# Patient Record
Sex: Female | Born: 1995 | Race: White | Hispanic: No | Marital: Single | Smoking: Former smoker
Health system: Southern US, Community
[De-identification: ages and names within clinical notes are randomized; demographics above are authoritative.]

## PROBLEM LIST (undated history)

## (undated) ENCOUNTER — Inpatient Hospital Stay: Payer: Self-pay

## (undated) DIAGNOSIS — F32A Depression, unspecified: Secondary | ICD-10-CM

## (undated) DIAGNOSIS — F419 Anxiety disorder, unspecified: Secondary | ICD-10-CM

## (undated) DIAGNOSIS — Z8619 Personal history of other infectious and parasitic diseases: Secondary | ICD-10-CM

## (undated) DIAGNOSIS — Z8669 Personal history of other diseases of the nervous system and sense organs: Secondary | ICD-10-CM

## (undated) DIAGNOSIS — A6000 Herpesviral infection of urogenital system, unspecified: Secondary | ICD-10-CM

## (undated) DIAGNOSIS — M519 Unspecified thoracic, thoracolumbar and lumbosacral intervertebral disc disorder: Secondary | ICD-10-CM

## (undated) DIAGNOSIS — F329 Major depressive disorder, single episode, unspecified: Secondary | ICD-10-CM

## (undated) DIAGNOSIS — I10 Essential (primary) hypertension: Secondary | ICD-10-CM

## (undated) DIAGNOSIS — N390 Urinary tract infection, site not specified: Secondary | ICD-10-CM

## (undated) DIAGNOSIS — O139 Gestational [pregnancy-induced] hypertension without significant proteinuria, unspecified trimester: Secondary | ICD-10-CM

## (undated) HISTORY — DX: Personal history of other infectious and parasitic diseases: Z86.19

## (undated) HISTORY — DX: Depression, unspecified: F32.A

## (undated) HISTORY — DX: Personal history of other diseases of the nervous system and sense organs: Z86.69

## (undated) HISTORY — DX: Urinary tract infection, site not specified: N39.0

## (undated) HISTORY — DX: Anxiety disorder, unspecified: F41.9

## (undated) HISTORY — DX: Major depressive disorder, single episode, unspecified: F32.9

## (undated) HISTORY — PX: OTHER SURGICAL HISTORY: SHX169

---
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
## 2014-05-16 ENCOUNTER — Emergency Department: Payer: Self-pay | Admitting: Emergency Medicine

## 2014-05-16 LAB — COMPREHENSIVE METABOLIC PANEL
ALBUMIN: 4.3 g/dL (ref 3.8–5.6)
ALK PHOS: 102 U/L
AST: 12 U/L (ref 0–26)
Anion Gap: 10 (ref 7–16)
BILIRUBIN TOTAL: 0.5 mg/dL (ref 0.2–1.0)
BUN: 16 mg/dL (ref 7–18)
CHLORIDE: 109 mmol/L — AB (ref 98–107)
Calcium, Total: 9.3 mg/dL (ref 9.0–10.7)
Co2: 21 mmol/L (ref 21–32)
Creatinine: 0.89 mg/dL (ref 0.60–1.30)
EGFR (African American): >60
EGFR (Non-African Amer.): >60
Glucose: 95 mg/dL (ref 65–99)
OSMOLALITY: 280 (ref 275–301)
POTASSIUM: 4 mmol/L (ref 3.5–5.1)
SGPT (ALT): 21 U/L
SODIUM: 140 mmol/L (ref 136–145)
TOTAL PROTEIN: 8 g/dL (ref 6.4–8.6)

## 2014-05-16 LAB — DRUG SCREEN, URINE
AMPHETAMINES, UR SCREEN: NEGATIVE (ref ?–1000)
BENZODIAZEPINE, UR SCRN: NEGATIVE (ref ?–200)
Barbiturates, Ur Screen: NEGATIVE (ref ?–200)
Cannabinoid 50 Ng, Ur ~~LOC~~: NEGATIVE (ref ?–50)
Cocaine Metabolite,Ur ~~LOC~~: POSITIVE (ref ?–300)
MDMA (Ecstasy)Ur Screen: NEGATIVE (ref ?–500)
Methadone, Ur Screen: NEGATIVE (ref ?–300)
Opiate, Ur Screen: NEGATIVE (ref ?–300)
Phencyclidine (PCP) Ur S: NEGATIVE (ref ?–25)
Tricyclic, Ur Screen: NEGATIVE (ref ?–1000)

## 2014-05-16 LAB — URINALYSIS, COMPLETE
Bilirubin,UR: NEGATIVE
Glucose,UR: NEGATIVE mg/dL (ref 0–75)
Nitrite: NEGATIVE
Ph: 5 (ref 4.5–8.0)
Protein: 100
Specific Gravity: 1.029 (ref 1.003–1.030)
Squamous Epithelial: 7

## 2014-05-16 LAB — CBC
HCT: 45.7 % (ref 35.0–47.0)
HGB: 15.1 g/dL (ref 12.0–16.0)
MCH: 30.3 pg (ref 26.0–34.0)
MCHC: 33.1 g/dL (ref 32.0–36.0)
MCV: 92 fL (ref 80–100)
Platelet: 269 10*3/uL (ref 150–440)
RBC: 4.98 10*6/uL (ref 3.80–5.20)
RDW: 13.3 % (ref 11.5–14.5)
WBC: 10.3 10*3/uL (ref 3.6–11.0)

## 2014-05-17 ENCOUNTER — Emergency Department: Payer: Self-pay | Admitting: Emergency Medicine

## 2014-05-17 LAB — CBC WITH DIFFERENTIAL/PLATELET
BASOS PCT: 0.1 %
Basophil #: 0 10*3/uL (ref 0.0–0.1)
EOS PCT: 0 %
Eosinophil #: 0 10*3/uL (ref 0.0–0.7)
HCT: 47.2 % — ABNORMAL HIGH (ref 35.0–47.0)
HGB: 15.2 g/dL (ref 12.0–16.0)
LYMPHS ABS: 1.1 10*3/uL (ref 1.0–3.6)
Lymphocyte %: 7.1 %
MCH: 29.9 pg (ref 26.0–34.0)
MCHC: 32.2 g/dL (ref 32.0–36.0)
MCV: 93 fL (ref 80–100)
MONOS PCT: 2.4 %
Monocyte #: 0.4 x10 3/mm (ref 0.2–0.9)
Neutrophil #: 13.4 10*3/uL — ABNORMAL HIGH (ref 1.4–6.5)
Neutrophil %: 90.4 %
Platelet: 301 10*3/uL (ref 150–440)
RBC: 5.08 10*6/uL (ref 3.80–5.20)
RDW: 13.4 % (ref 11.5–14.5)
WBC: 14.9 10*3/uL — ABNORMAL HIGH (ref 3.6–11.0)

## 2014-05-17 LAB — BASIC METABOLIC PANEL
ANION GAP: 11 (ref 7–16)
BUN: 18 mg/dL (ref 7–18)
CO2: 22 mmol/L (ref 21–32)
CREATININE: 1.04 mg/dL (ref 0.60–1.30)
Calcium, Total: 9.4 mg/dL (ref 9.0–10.7)
Chloride: 106 mmol/L (ref 98–107)
EGFR (Non-African Amer.): >60
Glucose: 106 mg/dL — ABNORMAL HIGH (ref 65–99)
Osmolality: 280 (ref 275–301)
Potassium: 3.8 mmol/L (ref 3.5–5.1)
Sodium: 139 mmol/L (ref 136–145)

## 2015-02-23 ENCOUNTER — Encounter: Payer: Self-pay | Admitting: Emergency Medicine

## 2015-02-23 ENCOUNTER — Emergency Department
Admission: EM | Admit: 2015-02-23 | Discharge: 2015-02-23 | Disposition: A | Discharge status: Home / Self Care | Payer: Self-pay | Attending: Emergency Medicine | Admitting: Emergency Medicine

## 2015-02-23 DIAGNOSIS — H6991 Unspecified Eustachian tube disorder, right ear: Secondary | ICD-10-CM | POA: Insufficient documentation

## 2015-02-23 DIAGNOSIS — Z3202 Encounter for pregnancy test, result negative: Secondary | ICD-10-CM | POA: Insufficient documentation

## 2015-02-23 DIAGNOSIS — H6981 Other specified disorders of Eustachian tube, right ear: Secondary | ICD-10-CM

## 2015-02-23 DIAGNOSIS — B9689 Other specified bacterial agents as the cause of diseases classified elsewhere: Secondary | ICD-10-CM

## 2015-02-23 DIAGNOSIS — N760 Acute vaginitis: Secondary | ICD-10-CM | POA: Insufficient documentation

## 2015-02-23 DIAGNOSIS — N72 Inflammatory disease of cervix uteri: Secondary | ICD-10-CM | POA: Insufficient documentation

## 2015-02-23 LAB — URINALYSIS COMPLETE WITH MICROSCOPIC (ARMC ONLY)
BILIRUBIN URINE: NEGATIVE
Bacteria, UA: NONE SEEN
Glucose, UA: NEGATIVE mg/dL
KETONES UR: NEGATIVE mg/dL
Nitrite: NEGATIVE
PH: 5 (ref 5.0–8.0)
Protein, ur: 30 mg/dL — AB
Specific Gravity, Urine: 1.031 — ABNORMAL HIGH (ref 1.005–1.030)

## 2015-02-23 LAB — WET PREP, GENITAL
Clue Cells Wet Prep HPF POC: POSITIVE — AB
Trich, Wet Prep: NONE SEEN
Yeast Wet Prep HPF POC: NONE SEEN

## 2015-02-23 LAB — CHLAMYDIA/NGC RT PCR (ARMC ONLY)
CHLAMYDIA TR: NOT DETECTED
N GONORRHOEAE: NOT DETECTED

## 2015-02-23 LAB — POCT PREGNANCY, URINE: PREG TEST UR: NEGATIVE

## 2015-02-23 MED ORDER — IBUPROFEN 800 MG PO TABS: 800.0000 mg | ORAL_TABLET | Freq: Three times a day (TID) | ORAL | Status: DC | PRN

## 2015-02-23 MED ORDER — CEFTRIAXONE SODIUM 1 G IJ SOLR
500.0000 mg | Freq: Once | INTRAMUSCULAR | Status: AC
  Administered 2015-02-23: 500 mg via INTRAMUSCULAR
  Filled 2015-02-23: qty 10

## 2015-02-23 MED ORDER — METRONIDAZOLE 500 MG PO TABS: 500.0000 mg | ORAL_TABLET | Freq: Two times a day (BID) | ORAL | Status: DC

## 2015-02-23 MED ORDER — LIDOCAINE HCL (PF) 1 % IJ SOLN
INTRAMUSCULAR | Status: AC
  Filled 2015-02-23: qty 5

## 2015-02-23 MED ORDER — AZITHROMYCIN 250 MG PO TABS
1000.0000 mg | ORAL_TABLET | Freq: Once | ORAL | Status: AC
  Administered 2015-02-23: 1000 mg via ORAL
  Filled 2015-02-23: qty 4

## 2015-02-23 MED ORDER — CETIRIZINE HCL 10 MG PO CAPS: 10.0000 mg | ORAL_CAPSULE | Freq: Every day | ORAL | Status: DC

## 2015-02-23 NOTE — ED Notes (Signed)
Pt reports bilateral ear pain for 2-3 weeks; Pt also here for STD check, reports vaginal discharge with foul odor for 5-6 days.

## 2015-02-23 NOTE — Discharge Instructions (Signed)
Bacterial Vaginosis °Bacterial vaginosis is a vaginal infection that occurs when the normal balance of bacteria in the vagina is disrupted. It results from an overgrowth of certain bacteria. This is the most common vaginal infection in women of childbearing age. Treatment is important to prevent complications, especially in pregnant women, as it can cause a premature delivery. °CAUSES  °Bacterial vaginosis is caused by an increase in harmful bacteria that are normally present in smaller amounts in the vagina. Several different kinds of bacteria can cause bacterial vaginosis. However, the reason that the condition develops is not fully understood. °RISK FACTORS °Certain activities or behaviors can put you at an increased risk of developing bacterial vaginosis, including: °· Having a new sex partner or multiple sex partners. °· Douching. °· Using an intrauterine device (IUD) for contraception. °Women do not get bacterial vaginosis from toilet seats, bedding, swimming pools, or contact with objects around them. °SIGNS AND SYMPTOMS  °Some women with bacterial vaginosis have no signs or symptoms. Common symptoms include: °· Grey vaginal discharge. °· A fishlike odor with discharge, especially after sexual intercourse. °· Itching or burning of the vagina and vulva. °· Burning or pain with urination. °DIAGNOSIS  °Your health care provider will take a medical history and examine the vagina for signs of bacterial vaginosis. A sample of vaginal fluid may be taken. Your health care provider will look at this sample under a microscope to check for bacteria and abnormal cells. A vaginal pH test may also be done.  °TREATMENT  °Bacterial vaginosis may be treated with antibiotic medicines. These may be given in the form of a pill or a vaginal cream. A second round of antibiotics may be prescribed if the condition comes back after treatment. Because bacterial vaginosis increases your risk for sexually transmitted diseases, getting  treated can help reduce your risk for chlamydia, gonorrhea, HIV, and herpes. °HOME CARE INSTRUCTIONS  °· Only take over-the-counter or prescription medicines as directed by your health care provider. °· If antibiotic medicine was prescribed, take it as directed. Make sure you finish it even if you start to feel better. °· Tell all sexual partners that you have a vaginal infection. They should see their health care provider and be treated if they have problems, such as a mild rash or itching. °· During treatment, it is important that you follow these instructions: °¨ Avoid sexual activity or use condoms correctly. °¨ Do not douche. °¨ Avoid alcohol as directed by your health care provider. °¨ Avoid breastfeeding as directed by your health care provider. °SEEK MEDICAL CARE IF:  °· Your symptoms are not improving after 3 days of treatment. °· You have increased discharge or pain. °· You have a fever. °MAKE SURE YOU:  °· Understand these instructions. °· Will watch your condition. °· Will get help right away if you are not doing well or get worse. °FOR MORE INFORMATION  °Centers for Disease Control and Prevention, Division of STD Prevention: www.cdc.gov/std °American Sexual Health Association (ASHA): www.ashastd.org  °  °This information is not intended to replace advice given to you by your health care provider. Make sure you discuss any questions you have with your health care provider. °  °Document Released: 04/15/2005 Document Revised: 05/06/2014 Document Reviewed: 11/25/2012 °Elsevier Interactive Patient Education ©2016 [employer] ° °Cervicitis °Cervicitis is a soreness and swelling (inflammation) of the cervix. Your cervix is located at the bottom of your uterus. It opens up to the vagina. °CAUSES  °· Sexually transmitted infections (STIs).   °·   Allergic reaction.   °· Medicines or birth control devices that are put in the vagina.   °· Injury to the cervix.   °· Bacterial infections.   °RISK FACTORS °You are at  greater risk if you: °· Have unprotected sexual intercourse. °· Have sexual intercourse with many partners. °· Began sexual intercourse at an early age. °· Have a history of STIs. °SYMPTOMS  °There may be no symptoms. If symptoms occur, they may include:  °· Gray, white, yellow, or bad-smelling vaginal discharge.   °· Pain or itching of the area outside the vagina.   °· Painful sexual intercourse.   °· Lower abdominal or lower back pain, especially during intercourse.   °· Frequent urination.   °· Abnormal vaginal bleeding between periods, after sexual intercourse, or after menopause.   °· Pressure or a heavy feeling in the pelvis.   °DIAGNOSIS  °Diagnosis is made after a pelvic exam. Other tests may include:  °· Examination of any discharge under a microscope (wet prep).   °· A Pap test.   °TREATMENT  °Treatment will depend on the cause of cervicitis. If it is caused by an STI, both you and your partner will need to be treated. Antibiotic medicines will be given.  °HOME CARE INSTRUCTIONS  °· Do not have sexual intercourse until your health care provider says it is okay.   °· Do not have sexual intercourse until your partner has been treated, if your cervicitis is caused by an STI.   °· Take your antibiotics as directed. Finish them even if you start to feel better.   °SEEK MEDICAL CARE IF: °· Your symptoms come back.   °· You have a fever.   °MAKE SURE YOU:  °· Understand these instructions. °· Will watch your condition. °· Will get help right away if you are not doing well or get worse. °  °This information is not intended to replace advice given to you by your health care provider. Make sure you discuss any questions you have with your health care provider. °  °Document Released: 04/15/2005 Document Revised: 04/20/2013 Document Reviewed: 10/07/2012 °Elsevier Interactive Patient Education ©2016 Elsevier Inc. ° °

## 2015-02-23 NOTE — ED Provider Notes (Signed)
[facility] Emergency Department Provider Note  ____________________________________________  Time seen: Approximately 3:50 PM  I have reviewed the triage vital signs and the nursing notes.   HISTORY  Chief Complaint Otalgia and SEXUALLY TRANSMITTED DISEASE   HPI Shelley Graham is a 19 y.o. female who presents to the emergency department for evaluation of right ear pain for the past few weeks as well as an STD check for vaginal discharge over the past 5-6 days. She denies fever. She states that she has noticed some malodorous discharge and her partner advised her that he either had chlamydia or gonorrhea. She denies abdominal pain or dysuria.   History reviewed. No pertinent past medical history.  There are no active problems to display for this patient.   History reviewed. No pertinent past surgical history.  Current Outpatient Rx  Name  Route  Sig  Dispense  Refill  . Cetirizine HCl 10 MG CAPS   Oral   Take 1 capsule (10 mg total) by mouth daily.   30 capsule   3   . ibuprofen (ADVIL,MOTRIN) 800 MG tablet   Oral   Take 1 tablet (800 mg total) by mouth every 8 (eight) hours as needed.   30 tablet   0   . metroNIDAZOLE (FLAGYL) 500 MG tablet   Oral   Take 1 tablet (500 mg total) by mouth 2 (two) times daily.   14 tablet   0     Allergies Review of patient's allergies indicates no known allergies.  No family history on file.  Social History Social History  Substance Use Topics  . Smoking status: Never Smoker   . Smokeless tobacco: None  . Alcohol Use: No    Review of Systems Constitutional: No fever/chills Cardiovascular: Denies chest pain. Respiratory: Denies shortness of breath or cough. Gastrointestinal: Abdominal pain no., nausea no, vomitingno. Genitourinary: Dysuria no, vaginal discharge yes.. Musculoskeletal: Negative for back pain. Skin: Negative for rash. Neurological: Negative for headaches, focal weakness or  numbness.  10-point ROS otherwise negative.  ____________________________________________   PHYSICAL EXAM:  VITAL SIGNS: ED Triage Vitals  Enc Vitals Group     BP 02/23/15 1303 134/81 mmHg     Pulse Rate 02/23/15 1303 110     Resp 02/23/15 1303 18     Temp 02/23/15 1303 97.9 F (36.6 C)     Temp Source 02/23/15 1303 Oral     SpO2 02/23/15 1303 100 %     Weight 02/23/15 1303 160 lb (72.576 kg)     Height 02/23/15 1303  (1.727 m)     Head Cir --      Peak Flow --      Pain Score 02/23/15 1324 0     Pain Loc --      Pain Edu? --      Excl. in GC? --     Constitutional: Alert and oriented. Well appearing and in no acute distress. Eyes: Conjunctivae are normal. PERRL. EOMI. Head: Atraumatic. Nose: No congestion/rhinnorhea. Mouth/Throat: Mucous membranes are moist.  Oropharynx non-erythematous. Neck: No stridor. Cardiovascular: Good peripheral circulation. Respiratory: Normal respiratory effort.  No retractions. Gastrointestinal: Soft and nontender. No distention. No abdominal bruits. Genitourinary: Pelvic exam: Discharge noted externally as well as on the vaginal walls. Body habitus made visualization of the cervix difficult. There was cervical motion tenderness noted on bimanual exam. Musculoskeletal: No extremity tenderness nor edema.  Neurologic:  Normal speech and language. No gross focal neurologic deficits are appreciated. Speech is  normal. No gait instability. Skin:  Skin is warm, dry and intact. No rash noted. Psychiatric: Mood and affect are normal. Speech and behavior are normal.  ____________________________________________   LABS (all labs ordered are listed, but only abnormal results are displayed)  Labs Reviewed  WET PREP, GENITAL - Abnormal; Notable for the following:    Clue Cells Wet Prep HPF POC POSITIVE (*)    WBC, Wet Prep HPF POC FEW (*)    All other components within normal limits  URINALYSIS COMPLETEWITH MICROSCOPIC ([facility] ONLY) - Abnormal;  Notable for the following:    Color, Urine YELLOW (*)    APPearance CLOUDY (*)    Specific Gravity, Urine 1.031 (*)    Hgb urine dipstick 1+ (*)    Protein, ur 30 (*)    Leukocytes, UA TRACE (*)    Squamous Epithelial / LPF 6-30 (*)    All other components within normal limits  CHLAMYDIA/NGC RT PCR ([facility] ONLY)  POC URINE PREG, ED  POCT PREGNANCY, URINE   ____________________________________________  RADIOLOGY   ____________________________________________   PROCEDURES  Procedure(s) performed:   ____________________________________________   INITIAL IMPRESSION / ASSESSMENT AND PLAN / ED COURSE  Pertinent labs & imaging results that were available during my care of the patient were reviewed by me and considered in my medical decision making (see chart for details).  IM Rocephin and 1 g of azithromycin given in the emergency department. She will take 1 week of Flagyl. She will take cetirizine and fluticasone for eustachian tube dysfunction. She was advised that her partner would need to be treated before they have intercourse. She was advised that he can be treated at the health [facility] STD clinic. She was advised to follow-up with STD clinic for symptoms that do not improve with medication. She was advised to follow-up with primary care for ear complaints that do not resolve with medication. She was advised to return to the emergency department for symptoms that change or worsen if she is unable to schedule an appointment. ____________________________________________   FINAL CLINICAL IMPRESSION(S) / ED DIAGNOSES  Final diagnoses:  Eustachian tube dysfunction, right  Bacterial vaginosis  Acute cervicitis       Chinita PesterCari B Alexa Golebiewski, FNP 02/23/15 1735  Emily FilbertJonathan E Williams, MD 02/24/15 2248

## 2015-02-28 ENCOUNTER — Emergency Department
Admission: EM | Admit: 2015-02-28 | Discharge: 2015-02-28 | Discharge status: Against Medical Advice | Payer: BLUE CROSS/BLUE SHIELD | Attending: Emergency Medicine | Admitting: Emergency Medicine

## 2015-02-28 ENCOUNTER — Emergency Department: Payer: BLUE CROSS/BLUE SHIELD

## 2015-02-28 ENCOUNTER — Encounter: Payer: Self-pay | Admitting: *Deleted

## 2015-02-28 DIAGNOSIS — O2330 Infections of other parts of urinary tract in pregnancy, unspecified trimester: Secondary | ICD-10-CM | POA: Insufficient documentation

## 2015-02-28 DIAGNOSIS — Z79899 Other long term (current) drug therapy: Secondary | ICD-10-CM | POA: Insufficient documentation

## 2015-02-28 DIAGNOSIS — Z3A00 Weeks of gestation of pregnancy not specified: Secondary | ICD-10-CM | POA: Insufficient documentation

## 2015-02-28 DIAGNOSIS — N390 Urinary tract infection, site not specified: Secondary | ICD-10-CM

## 2015-02-28 DIAGNOSIS — R109 Unspecified abdominal pain: Secondary | ICD-10-CM

## 2015-02-28 DIAGNOSIS — O26899 Other specified pregnancy related conditions, unspecified trimester: Secondary | ICD-10-CM

## 2015-02-28 DIAGNOSIS — O9989 Other specified diseases and conditions complicating pregnancy, childbirth and the puerperium: Secondary | ICD-10-CM | POA: Diagnosis present

## 2015-02-28 LAB — CBC WITH DIFFERENTIAL/PLATELET
BASOS ABS: 0.1 10*3/uL (ref 0–0.1)
Basophils Relative: 1 %
EOS ABS: 0.3 10*3/uL (ref 0–0.7)
EOS PCT: 3 %
HCT: 39.3 % (ref 35.0–47.0)
Hemoglobin: 13.1 g/dL (ref 12.0–16.0)
Lymphocytes Relative: 29 %
Lymphs Abs: 3.7 10*3/uL — ABNORMAL HIGH (ref 1.0–3.6)
MCH: 30.7 pg (ref 26.0–34.0)
MCHC: 33.4 g/dL (ref 32.0–36.0)
MCV: 91.9 fL (ref 80.0–100.0)
Monocytes Absolute: 0.8 10*3/uL (ref 0.2–0.9)
Monocytes Relative: 7 %
Neutro Abs: 7.8 10*3/uL — ABNORMAL HIGH (ref 1.4–6.5)
Neutrophils Relative %: 60 %
PLATELETS: 275 10*3/uL (ref 150–440)
RBC: 4.27 MIL/uL (ref 3.80–5.20)
RDW: 13.6 % (ref 11.5–14.5)
WBC: 12.8 10*3/uL — AB (ref 3.6–11.0)

## 2015-02-28 LAB — COMPREHENSIVE METABOLIC PANEL
ALT: 26 U/L (ref 14–54)
AST: 22 U/L (ref 15–41)
Albumin: 4 g/dL (ref 3.5–5.0)
Alkaline Phosphatase: 59 U/L (ref 38–126)
Anion gap: 6 (ref 5–15)
BUN: 15 mg/dL (ref 6–20)
CHLORIDE: 109 mmol/L (ref 101–111)
CO2: 24 mmol/L (ref 22–32)
CREATININE: 0.8 mg/dL (ref 0.44–1.00)
Calcium: 9.4 mg/dL (ref 8.9–10.3)
GFR calc Af Amer: >60 mL/min (ref >60)
GFR calc non Af Amer: >60 mL/min (ref >60)
Glucose, Bld: 80 mg/dL (ref 65–99)
Potassium: 4.1 mmol/L (ref 3.5–5.1)
SODIUM: 139 mmol/L (ref 135–145)
Total Bilirubin: 0.6 mg/dL (ref 0.3–1.2)
Total Protein: 6.9 g/dL (ref 6.5–8.1)

## 2015-02-28 LAB — URINALYSIS COMPLETE WITH MICROSCOPIC (ARMC ONLY)
BACTERIA UA: NONE SEEN
BILIRUBIN URINE: NEGATIVE
GLUCOSE, UA: NEGATIVE mg/dL
Ketones, ur: NEGATIVE mg/dL
Nitrite: NEGATIVE
Protein, ur: NEGATIVE mg/dL
Specific Gravity, Urine: 1.014 (ref 1.005–1.030)
pH: 5 (ref 5.0–8.0)

## 2015-02-28 LAB — WET PREP, GENITAL
Clue Cells Wet Prep HPF POC: NONE SEEN
TRICH WET PREP: NONE SEEN
Yeast Wet Prep HPF POC: NONE SEEN

## 2015-02-28 LAB — CHLAMYDIA/NGC RT PCR (ARMC ONLY)
CHLAMYDIA TR: NOT DETECTED
N gonorrhoeae: NOT DETECTED

## 2015-02-28 LAB — HCG, QUANTITATIVE, PREGNANCY: HCG, BETA CHAIN, QUANT, S: 305 m[IU]/mL — AB (ref <5)

## 2015-02-28 LAB — ABO/RH: ABO/RH(D): A POS

## 2015-02-28 NOTE — ED Notes (Addendum)
DISCHARGE:  Pt and friend walked out of room prior to discharge instructions given and prior to patient signing discharge form, prior to ordered ultrasound and prior to nurse removing IV.  Pt found in the bathroom of the waiting area.  Pt was approached and stated that she already removed her IV by herself.  Old IV site appeared intact.  Pt was given opportunity 2 times to come back to room and receive discharge papers and instructions, and patient refused each time, stating that she would make her follow-up appointment.  Pt did not sign discharge ED document.  Friend to drive patient home.

## 2015-02-28 NOTE — ED Provider Notes (Signed)
[facility] Emergency Department Provider Note  ____________________________________________  Time seen: Approximately 4 PM  I have reviewed the triage vital signs and the nursing notes.   HISTORY  Chief Complaint Abdominal Pain    HPI Shelley Graham is a 19 y.o. female who was treated for gonorrhea and chlamydia one week ago who is presenting today with lower abdominal pain and positive pregnancy test. She denies any vaginal bleeding or discharge at this time. She describes the pain is cramping in right over the middle of her lower abdomen. Denies any radiation. No pain with urination. Denies any nausea vomiting. Says that she has had diarrhea over the past month. Denies any blood in her stool. No change in her stooling pattern. Says that this is her first pregnancy.   No past medical history on file.  There are no active problems to display for this patient.   No past surgical history on file.  Current Outpatient Rx  Name  Route  Sig  Dispense  Refill  . Cetirizine HCl 10 MG CAPS   Oral   Take 1 capsule (10 mg total) by mouth daily.   30 capsule   3   . ibuprofen (ADVIL,MOTRIN) 800 MG tablet   Oral   Take 1 tablet (800 mg total) by mouth every 8 (eight) hours as needed.   30 tablet   0   . metroNIDAZOLE (FLAGYL) 500 MG tablet   Oral   Take 1 tablet (500 mg total) by mouth 2 (two) times daily.   14 tablet   0     Allergies Review of patient's allergies indicates no known allergies.  No family history on file.  Social History Social History  Substance Use Topics  . Smoking status: Never Smoker   . Smokeless tobacco: None  . Alcohol Use: No    Review of Systems Constitutional: No fever/chills Eyes: No visual changes. ENT: No sore throat. Cardiovascular: Denies chest pain. Respiratory: Denies shortness of breath. Gastrointestinal: No nausea, no vomiting.  No diarrhea.  No constipation. Genitourinary: Negative for  dysuria. Musculoskeletal: Negative for back pain. Skin: Negative for rash. Neurological: Negative for headaches, focal weakness or numbness.  10-point ROS otherwise negative.  ____________________________________________   PHYSICAL EXAM:  VITAL SIGNS: ED Triage Vitals  Enc Vitals Group     BP 02/28/15 1522 136/86 mmHg     Pulse Rate 02/28/15 1522 95     Resp 02/28/15 1522 20     Temp 02/28/15 1522 98.6 F (37 C)     Temp src --      SpO2 02/28/15 1522 97 %     Weight 02/28/15 1522 160 lb (72.576 kg)     Height 02/28/15 1522 5\' 8"  (1.727 m)     Head Cir --      Peak Flow --      Pain Score --      Pain Loc --      Pain Edu? --      Excl. in GC? --     Constitutional: Alert and oriented. Well appearing and in no acute distress. Eyes: Conjunctivae are normal. PERRL. EOMI. Head: Atraumatic. Nose: No congestion/rhinnorhea. Mouth/Throat: Mucous membranes are moist.  Oropharynx non-erythematous. Neck: No stridor.   Cardiovascular: Normal rate, regular rhythm. Grossly normal heart sounds.  Good peripheral circulation. Respiratory: Normal respiratory effort.  No retractions. Lungs CTAB. Gastrointestinal: Soft with mild tenderness over the suprapubic region. No rebound or guarding. No distention. No abdominal bruits. No CVA  tenderness. Genitourinary: Normal external appearance. Speculum exam without any discharge or bleeding. Bimanual exam with a closed cervix. There is no CMT. There is mild uterine as well as adnexal tenderness without palpation of any masses. Musculoskeletal: No lower extremity tenderness nor edema.  No joint effusions. Neurologic:  Normal speech and language. No gross focal neurologic deficits are appreciated. No gait instability. Skin:  Skin is warm, dry and intact. No rash noted. Psychiatric: Mood and affect are normal. Speech and behavior are normal.  ____________________________________________   LABS (all labs ordered are listed, but only abnormal  results are displayed)  Labs Reviewed  WET PREP, GENITAL - Abnormal; Notable for the following:    WBC, Wet Prep HPF POC FEW (*)    All other components within normal limits  CBC WITH DIFFERENTIAL/PLATELET - Abnormal; Notable for the following:    WBC 12.8 (*)    Neutro Abs 7.8 (*)    Lymphs Abs 3.7 (*)    All other components within normal limits  URINALYSIS COMPLETEWITH MICROSCOPIC ([facility] ONLY) - Abnormal; Notable for the following:    Color, Urine YELLOW (*)    APPearance CLEAR (*)    Hgb urine dipstick 1+ (*)    Leukocytes, UA TRACE (*)    Squamous Epithelial / LPF 6-30 (*)    All other components within normal limits  CHLAMYDIA/NGC RT PCR ([facility] ONLY)  URINE CULTURE  COMPREHENSIVE METABOLIC PANEL  HCG, QUANTITATIVE, PREGNANCY  POC URINE PREG, ED  ABO/RH   ____________________________________________  EKG   ____________________________________________  RADIOLOGY   ____________________________________________   PROCEDURES    ____________________________________________   INITIAL IMPRESSION / ASSESSMENT AND PLAN / ED COURSE  Pertinent labs & imaging results that were available during my care of the patient were reviewed by me and considered in my medical decision making (see chart for details).  Appears to have a chronically elevated white blood cell count.  ----------------------------------------- 5:53 PM on 02/28/2015 -----------------------------------------  Patient and accompanying party seen leaving the room by nursing staff. The nursing staff followed the patient and her friend/family to the bathroom because it appeared that they were eloping. Nursing staff asked the patient if she could please return to the room as we're still waiting for lab work as well as her ultrasound. At this point, the patient said that she needed to leave and that she will follow up as an outpatient. She already had taken out her IV which was found in the trash in  the room. I was not able to give the patient verbal or written discharge instructions and her workup is still incomplete. I called the home number listed on her demographics and her medical record and someone answered and then after I introduced myself promptly hung up on me. During my initial interview with the patient I did discuss that would be looking for an ectopic pregnancy. This is still part of the differential diagnosis at this time especially since the patient was recently diagnosed with a sexually transmitted disease. Furthermore, the patient is showing signs of urinary tract infection with trace leukocyte esterase. Unfortunately, was not able to give return precautions as the patient insisted on leaving immediately.   ____________________________________________   FINAL CLINICAL IMPRESSION(S) / ED DIAGNOSES  Abdominal pain in pregnancy. UTI.    Myrna Blazer, MD 02/28/15 [phone]

## 2015-02-28 NOTE — ED Notes (Signed)
Pt reports low abd pain with positive home pregnancy test yesterday.  Pt was seen in er on Feb 23 2015 for similar sx and was dx with bacterial infection.  No vag bleeding.  No urinary sx.

## 2015-02-28 NOTE — ED Notes (Addendum)
POC urine preg test was positive.

## 2015-02-28 NOTE — ED Notes (Signed)
Pt has low abd pain and cramping.  Pt reports positive pregnancy test yesterday.  Pt denies vag bleeding no low back pain or urinary sx.  No n/v/d.  Pt had negative preg test in er last week.  Last period was approx 01/26/15.

## 2015-03-04 LAB — URINE CULTURE: Culture: 50000

## 2015-03-05 ENCOUNTER — Telehealth: Payer: Self-pay | Admitting: Pharmacist

## 2015-03-05 NOTE — Telephone Encounter (Signed)
Pt called and informed that urine cx was positive for UTI. Offered to call rx into pharmacy of choice. [facility] in [place] was requested. Amoxicillin 500mg  bid x 10 days was called in-left VM on provider line. Authorized by dr Dorothea Glassmanpaul malinda.  Olene FlossMelissa D Taiya Nutting, Pharm.D Clinical Pharmacist

## 2015-03-09 ENCOUNTER — Ambulatory Visit (INDEPENDENT_AMBULATORY_CARE_PROVIDER_SITE_OTHER): Payer: BLUE CROSS/BLUE SHIELD | Admitting: Obstetrics and Gynecology

## 2015-03-09 ENCOUNTER — Other Ambulatory Visit: Payer: BLUE CROSS/BLUE SHIELD

## 2015-03-09 ENCOUNTER — Ambulatory Visit: Payer: BLUE CROSS/BLUE SHIELD

## 2015-03-09 VITALS — BP 117/81 | HR 80 | Wt 204.2 lb

## 2015-03-09 DIAGNOSIS — Z36 Encounter for antenatal screening of mother: Secondary | ICD-10-CM | POA: Diagnosis not present

## 2015-03-09 DIAGNOSIS — N926 Irregular menstruation, unspecified: Secondary | ICD-10-CM

## 2015-03-09 DIAGNOSIS — R519 Headache, unspecified: Secondary | ICD-10-CM | POA: Insufficient documentation

## 2015-03-09 DIAGNOSIS — Z8669 Personal history of other diseases of the nervous system and sense organs: Secondary | ICD-10-CM

## 2015-03-09 DIAGNOSIS — Z369 Encounter for antenatal screening, unspecified: Secondary | ICD-10-CM

## 2015-03-09 DIAGNOSIS — R638 Other symptoms and signs concerning food and fluid intake: Secondary | ICD-10-CM

## 2015-03-09 DIAGNOSIS — Z331 Pregnant state, incidental: Secondary | ICD-10-CM

## 2015-03-09 DIAGNOSIS — R51 Headache: Secondary | ICD-10-CM

## 2015-03-09 DIAGNOSIS — Z3490 Encounter for supervision of normal pregnancy, unspecified, unspecified trimester: Secondary | ICD-10-CM

## 2015-03-09 DIAGNOSIS — Z3687 Encounter for antenatal screening for uncertain dates: Secondary | ICD-10-CM

## 2015-03-09 DIAGNOSIS — Z113 Encounter for screening for infections with a predominantly sexual mode of transmission: Secondary | ICD-10-CM

## 2015-03-09 DIAGNOSIS — F419 Anxiety disorder, unspecified: Secondary | ICD-10-CM

## 2015-03-09 DIAGNOSIS — F32A Depression, unspecified: Secondary | ICD-10-CM

## 2015-03-09 DIAGNOSIS — F329 Major depressive disorder, single episode, unspecified: Secondary | ICD-10-CM

## 2015-03-09 NOTE — Patient Instructions (Signed)

## 2015-03-09 NOTE — Progress Notes (Signed)
Shelley Graham presents for NOB nurse interview visit. G-1.  P-0. Pregnancy education material explained and given. No cats in the home. NOB labs ordered. TSH/HbgA1c due to Increased BMI. HIV labs and Drug screen were explained optional and she could opt out of tests but did not decline. Drug screen ordered and sent to lab. PNV encouraged. No nausea at present, to contact office if needed. NT to discuss with provider. Has fhx of spina bifida (mother) and will want to do this. Pt. To follow up with provider in 4 weeks for NOB physical. This may change pending Ultrasound for viability and dating. Pt left ER before ultrasound done. It was a wait of 3 hrs and she did not want to wait. Also ordered [facility], on 02/28/15, [id number] at ER.   All questions answered.  ZIKA EXPOSURE SCREEN:  The patient has not traveled to a [place] endemic area within the past 6 months, nor has she had unprotected sex with a partner who has travelled to a [place] endemic region within the past 6 months. The patient has been advised to notify us if these factors change any time during this current pregnancy, so adequate testing and monitoring can be initiated.

## 2015-03-10 ENCOUNTER — Other Ambulatory Visit: Payer: Self-pay | Admitting: Obstetrics and Gynecology

## 2015-03-10 DIAGNOSIS — O09899 Supervision of other high risk pregnancies, unspecified trimester: Secondary | ICD-10-CM

## 2015-03-10 DIAGNOSIS — Z283 Underimmunization status: Secondary | ICD-10-CM

## 2015-03-10 DIAGNOSIS — O9989 Other specified diseases and conditions complicating pregnancy, childbirth and the puerperium: Secondary | ICD-10-CM

## 2015-03-10 DIAGNOSIS — Z2839 Other underimmunization status: Secondary | ICD-10-CM

## 2015-03-10 LAB — MICROSCOPIC EXAMINATION: CASTS: NONE SEEN /LPF

## 2015-03-10 LAB — CBC WITH DIFFERENTIAL/PLATELET
Basophils Absolute: 0 10*3/uL (ref 0.0–0.2)
Basos: 0 %
EOS (ABSOLUTE): 0.2 10*3/uL (ref 0.0–0.4)
Eos: 2 %
Hematocrit: 40.2 % (ref 34.0–46.6)
Hemoglobin: 13.6 g/dL (ref 11.1–15.9)
Immature Grans (Abs): 0 10*3/uL (ref 0.0–0.1)
Immature Granulocytes: 0 %
Lymphocytes Absolute: 2.7 10*3/uL (ref 0.7–3.1)
Lymphs: 28 %
MCH: 30.9 pg (ref 26.6–33.0)
MCHC: 33.8 g/dL (ref 31.5–35.7)
MCV: 91 fL (ref 79–97)
Monocytes Absolute: 0.6 10*3/uL (ref 0.1–0.9)
Monocytes: 6 %
Neutrophils Absolute: 6.4 10*3/uL (ref 1.4–7.0)
Neutrophils: 64 %
Platelets: 291 10*3/uL (ref 150–379)
RBC: 4.4 x10E6/uL (ref 3.77–5.28)
RDW: 14 % (ref 12.3–15.4)
WBC: 10 10*3/uL (ref 3.4–10.8)

## 2015-03-10 LAB — URINALYSIS, ROUTINE W REFLEX MICROSCOPIC
BILIRUBIN UA: NEGATIVE
Glucose, UA: NEGATIVE
KETONES UA: NEGATIVE
NITRITE UA: NEGATIVE
SPEC GRAV UA: 1.028 (ref 1.005–1.030)
UUROB: 0.2 mg/dL (ref 0.2–1.0)
pH, UA: 5.5 (ref 5.0–7.5)

## 2015-03-10 LAB — HIV ANTIBODY (ROUTINE TESTING W REFLEX): HIV Screen 4th Generation wRfx: NONREACTIVE

## 2015-03-10 LAB — HEMOGLOBIN A1C
ESTIMATED AVERAGE GLUCOSE: 103 mg/dL
HEMOGLOBIN A1C: 5.2 % (ref 4.8–5.6)

## 2015-03-10 LAB — GC/CHLAMYDIA PROBE AMP
Chlamydia trachomatis, NAA: NEGATIVE
Neisseria gonorrhoeae by PCR: NEGATIVE

## 2015-03-10 LAB — TSH: TSH: 0.992 u[IU]/mL (ref 0.450–4.500)

## 2015-03-10 LAB — RPR: RPR Ser Ql: NONREACTIVE

## 2015-03-10 LAB — RUBELLA ANTIBODY, IGM: Rubella IgM: <20 AU/mL (ref 0.0–19.9)

## 2015-03-10 LAB — HEPATITIS B SURFACE ANTIGEN: Hepatitis B Surface Ag: NEGATIVE

## 2015-03-10 LAB — VARICELLA ZOSTER ANTIBODY, IGM

## 2015-03-10 LAB — ANTIBODY SCREEN: Antibody Screen: NEGATIVE

## 2015-03-11 ENCOUNTER — Other Ambulatory Visit: Payer: Self-pay | Admitting: Obstetrics and Gynecology

## 2015-03-11 DIAGNOSIS — F1310 Sedative, hypnotic or anxiolytic abuse, uncomplicated: Secondary | ICD-10-CM | POA: Insufficient documentation

## 2015-03-11 LAB — PAIN MGT SCRN (14 DRUGS), UR
AMPHETAMINE SCRN UR: NEGATIVE ng/mL
BUPRENORPHINE, URINE: NEGATIVE ng/mL
Barbiturate Screen, Ur: NEGATIVE ng/mL
Benzodiazepine Screen, Urine: POSITIVE ng/mL
Cannabinoids Ur Ql Scn: NEGATIVE ng/mL
Cocaine(Metab.)Screen, Urine: NEGATIVE ng/mL
Creatinine(Crt), U: 202.9 mg/dL (ref 20.0–300.0)
Fentanyl, Urine: NEGATIVE pg/mL
MEPERIDINE SCREEN, URINE: NEGATIVE ng/mL
METHADONE SCREEN, URINE: NEGATIVE ng/mL
OPIATE SCRN UR: NEGATIVE ng/mL
OXYCODONE+OXYMORPHONE UR QL SCN: NEGATIVE ng/mL
PCP SCRN UR: NEGATIVE ng/mL
PROPOXYPHENE SCREEN: NEGATIVE ng/mL
Ph of Urine: 5.1 (ref 4.5–8.9)
TRAMADOL UR QL SCN: NEGATIVE ng/mL

## 2015-03-11 LAB — BETA HCG QUANT (REF LAB): hCG Quant: 5843 m[IU]/mL

## 2015-03-11 LAB — NICOTINE SCREEN, URINE: Cotinine Ql Scrn, Ur: POSITIVE ng/mL

## 2015-03-11 LAB — SPECIMEN STATUS REPORT

## 2015-03-12 LAB — URINE CULTURE, OB REFLEX

## 2015-03-12 LAB — CULTURE, OB URINE

## 2015-03-14 ENCOUNTER — Other Ambulatory Visit: Payer: Self-pay | Admitting: Obstetrics and Gynecology

## 2015-03-14 DIAGNOSIS — R8271 Bacteriuria: Secondary | ICD-10-CM

## 2015-03-14 DIAGNOSIS — B3749 Other urogenital candidiasis: Secondary | ICD-10-CM | POA: Insufficient documentation

## 2015-03-14 MED ORDER — FLUCONAZOLE 100 MG PO TABS: 100.0000 mg | ORAL_TABLET | Freq: Every day | ORAL | Status: AC

## 2015-03-14 MED ORDER — AMOXICILLIN 500 MG PO CAPS: 500.0000 mg | ORAL_CAPSULE | Freq: Three times a day (TID) | ORAL | Status: DC

## 2015-03-16 ENCOUNTER — Other Ambulatory Visit: Payer: BLUE CROSS/BLUE SHIELD

## 2015-03-16 ENCOUNTER — Telehealth: Payer: Self-pay | Admitting: *Deleted

## 2015-03-16 NOTE — Telephone Encounter (Signed)
Notified pt of results 

## 2015-03-16 NOTE — Telephone Encounter (Signed)
-----   Message from Ulyses AmorMelody N Burr, [place] sent at 03/11/2015  7:17 AM EST ----- Please let her know blood pregnancy levels at c/w 5 weeks

## 2015-03-21 ENCOUNTER — Other Ambulatory Visit: Payer: BLUE CROSS/BLUE SHIELD

## 2015-03-29 ENCOUNTER — Ambulatory Visit (INDEPENDENT_AMBULATORY_CARE_PROVIDER_SITE_OTHER): Payer: BLUE CROSS/BLUE SHIELD

## 2015-03-29 ENCOUNTER — Other Ambulatory Visit: Payer: Self-pay | Admitting: Obstetrics and Gynecology

## 2015-03-29 ENCOUNTER — Other Ambulatory Visit: Payer: BLUE CROSS/BLUE SHIELD

## 2015-03-29 DIAGNOSIS — Z36 Encounter for antenatal screening of mother: Secondary | ICD-10-CM | POA: Diagnosis not present

## 2015-03-29 DIAGNOSIS — Z3687 Encounter for antenatal screening for uncertain dates: Secondary | ICD-10-CM

## 2015-04-23 ENCOUNTER — Emergency Department: Payer: Medicaid Other

## 2015-04-23 ENCOUNTER — Emergency Department
Admission: EM | Admit: 2015-04-23 | Discharge: 2015-04-23 | Disposition: A | Discharge status: Home / Self Care | Payer: Medicaid Other | Attending: Emergency Medicine | Admitting: Emergency Medicine

## 2015-04-23 DIAGNOSIS — Z79899 Other long term (current) drug therapy: Secondary | ICD-10-CM | POA: Diagnosis not present

## 2015-04-23 DIAGNOSIS — F17200 Nicotine dependence, unspecified, uncomplicated: Secondary | ICD-10-CM | POA: Diagnosis not present

## 2015-04-23 DIAGNOSIS — Z792 Long term (current) use of antibiotics: Secondary | ICD-10-CM | POA: Insufficient documentation

## 2015-04-23 DIAGNOSIS — O99331 Smoking (tobacco) complicating pregnancy, first trimester: Secondary | ICD-10-CM | POA: Diagnosis not present

## 2015-04-23 DIAGNOSIS — O9989 Other specified diseases and conditions complicating pregnancy, childbirth and the puerperium: Secondary | ICD-10-CM | POA: Diagnosis present

## 2015-04-23 DIAGNOSIS — K8020 Calculus of gallbladder without cholecystitis without obstruction: Secondary | ICD-10-CM

## 2015-04-23 DIAGNOSIS — O99611 Diseases of the digestive system complicating pregnancy, first trimester: Secondary | ICD-10-CM | POA: Diagnosis not present

## 2015-04-23 DIAGNOSIS — K219 Gastro-esophageal reflux disease without esophagitis: Secondary | ICD-10-CM

## 2015-04-23 DIAGNOSIS — Z3A12 12 weeks gestation of pregnancy: Secondary | ICD-10-CM | POA: Diagnosis not present

## 2015-04-23 DIAGNOSIS — K8070 Calculus of gallbladder and bile duct without cholecystitis without obstruction: Secondary | ICD-10-CM | POA: Diagnosis not present

## 2015-04-23 DIAGNOSIS — O2341 Unspecified infection of urinary tract in pregnancy, first trimester: Secondary | ICD-10-CM | POA: Insufficient documentation

## 2015-04-23 DIAGNOSIS — R8271 Bacteriuria: Secondary | ICD-10-CM

## 2015-04-23 LAB — COMPREHENSIVE METABOLIC PANEL
ALBUMIN: 3.9 g/dL (ref 3.5–5.0)
ALK PHOS: 66 U/L (ref 38–126)
ALT: 26 U/L (ref 14–54)
AST: 39 U/L (ref 15–41)
Anion gap: 7 (ref 5–15)
BUN: 15 mg/dL (ref 6–20)
CALCIUM: 9.2 mg/dL (ref 8.9–10.3)
CHLORIDE: 109 mmol/L (ref 101–111)
CO2: 23 mmol/L (ref 22–32)
CREATININE: 0.54 mg/dL (ref 0.44–1.00)
GFR calc Af Amer: >60 mL/min (ref >60)
GFR calc non Af Amer: >60 mL/min (ref >60)
GLUCOSE: 120 mg/dL — AB (ref 65–99)
Potassium: 3.4 mmol/L — ABNORMAL LOW (ref 3.5–5.1)
SODIUM: 139 mmol/L (ref 135–145)
Total Bilirubin: 0.4 mg/dL (ref 0.3–1.2)
Total Protein: 6.7 g/dL (ref 6.5–8.1)

## 2015-04-23 LAB — CBC WITH DIFFERENTIAL/PLATELET
Basophils Absolute: 0.1 10*3/uL (ref 0–0.1)
Basophils Relative: 1 %
EOS PCT: 3 %
Eosinophils Absolute: 0.4 10*3/uL (ref 0–0.7)
HCT: 38.2 % (ref 35.0–47.0)
Hemoglobin: 13.3 g/dL (ref 12.0–16.0)
LYMPHS ABS: 4.4 10*3/uL — AB (ref 1.0–3.6)
LYMPHS PCT: 37 %
MCH: 31.4 pg (ref 26.0–34.0)
MCHC: 34.8 g/dL (ref 32.0–36.0)
MCV: 90.3 fL (ref 80.0–100.0)
MONO ABS: 0.5 10*3/uL (ref 0.2–0.9)
MONOS PCT: 4 %
Neutro Abs: 6.7 10*3/uL — ABNORMAL HIGH (ref 1.4–6.5)
Neutrophils Relative %: 55 %
PLATELETS: 203 10*3/uL (ref 150–440)
RBC: 4.23 MIL/uL (ref 3.80–5.20)
RDW: 13.3 % (ref 11.5–14.5)
WBC: 12 10*3/uL — AB (ref 3.6–11.0)

## 2015-04-23 LAB — URINALYSIS COMPLETE WITH MICROSCOPIC (ARMC ONLY)
BILIRUBIN URINE: NEGATIVE
Glucose, UA: NEGATIVE mg/dL
Ketones, ur: NEGATIVE mg/dL
Nitrite: NEGATIVE
PH: 5 (ref 5.0–8.0)
PROTEIN: 30 mg/dL — AB
Specific Gravity, Urine: 1.026 (ref 1.005–1.030)

## 2015-04-23 LAB — POCT PREGNANCY, URINE: PREG TEST UR: POSITIVE — AB

## 2015-04-23 LAB — LIPASE, BLOOD: Lipase: 22 U/L (ref 11–51)

## 2015-04-23 MED ORDER — DOCUSATE SODIUM 100 MG PO CAPS: ORAL_CAPSULE | ORAL | Status: DC

## 2015-04-23 MED ORDER — OXYCODONE-ACETAMINOPHEN 5-325 MG PO TABS
2.0000 | ORAL_TABLET | Freq: Once | ORAL | Status: AC
  Administered 2015-04-23: 2 via ORAL
  Filled 2015-04-23: qty 2

## 2015-04-23 MED ORDER — ONDANSETRON HCL 4 MG PO TABS: ORAL_TABLET | ORAL | Status: DC

## 2015-04-23 MED ORDER — NITROFURANTOIN MONOHYD MACRO 100 MG PO CAPS: 100.0000 mg | ORAL_CAPSULE | Freq: Two times a day (BID) | ORAL | Status: AC

## 2015-04-23 MED ORDER — HYDROCODONE-ACETAMINOPHEN 5-325 MG PO TABS: 1.0000 | ORAL_TABLET | ORAL | Status: DC | PRN

## 2015-04-23 MED ORDER — GI COCKTAIL ~~LOC~~
30.0000 mL | Freq: Once | ORAL | Status: AC
  Administered 2015-04-23: 30 mL via ORAL
  Filled 2015-04-23: qty 30

## 2015-04-23 NOTE — ED Provider Notes (Signed)
[facility] Emergency Department Provider Note  ____________________________________________  Time seen: Approximately 5:29 AM  I have reviewed the triage vital signs and the nursing notes.   HISTORY  Chief Complaint Abdominal Pain    HPI Shelley Graham is a 19 y.o. female with no significant PMH who is a G1 @ 12 weeks (goes to [employer]) who has had an uncomplicated pregnancy presents by private vehicle with acute onset epigastric and right upper quadrant abdominal pain that started about an hour and a half ago.  She had some pasta for dinner and some pizza right before bed at midnight.  She awoke with severe pain which she describes as sharp and aching in her upper abdomen.  She has had nausea but no vomiting.  She denies fever/chills, chest pain, shortness of breath.  Nothing makes it better and nothing makes it worse although lying supine does seem to slightly worsen the pain.  It is not radiating to any other parts of her body.  She denies dysuria, vaginal bleeding, and vaginal discharge.  She also denies diarrhea and constipation and has had a normal bowel movement earlier tonight.   Past Medical History  Diagnosis Date  . History of migraine headaches   . Hx of chlamydia infection   . Anxiety   . Depression   . UTI (lower urinary tract infection)     RESOLVED    Patient Active Problem List   Diagnosis Date Noted  . GBS bacteriuria 03/14/2015  . Yeast UTI 03/14/2015  . 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 Maternal varicella, non-immune 03/10/2015  . Rubella non-immune status, antepartum 03/10/2015  . Headache   . History of migraine headaches   . Anxiety   . Depression     Past Surgical History  Procedure Laterality Date  . Other surgical history      tubes ears 18 months    Current Outpatient Rx  Name  Route  Sig  Dispense  Refill  . amoxicillin (AMOXIL) 500 MG capsule   Oral   Take 1 capsule (500 mg total) by mouth 3 (three)  times daily.   21 capsule   2   . Cetirizine HCl 10 MG CAPS   Oral   Take 1 capsule (10 mg total) by mouth daily.   30 capsule   3   . docusate sodium (COLACE) 100 MG capsule      Take 1 tablet once or twice daily as needed for constipation while taking narcotic pain medicine   30 capsule   0   . HYDROcodone-acetaminophen (NORCO/VICODIN) 5-325 MG tablet   Oral   Take 1-2 tablets by mouth every 4 (four) hours as needed for moderate pain.   15 tablet   0   . nitrofurantoin, macrocrystal-monohydrate, (MACROBID) 100 MG capsule   Oral   Take 1 capsule (100 mg total) by mouth 2 (two) times daily.   14 capsule   0   . ondansetron (ZOFRAN) 4 MG tablet      Take 1-2 tabs by mouth every 8 hours as needed for nausea/vomiting   30 tablet   0     Allergies Review of patient's allergies indicates no known allergies.  Family History  Problem Relation Age of Onset  . Asthma Mother     as child  . Asthma Brother   . Cancer Father   . Cancer Maternal Grandmother     colon  . Cancer Paternal Grandfather     lung  .  Migraines Mother   . Thyroid disease Mother     Social History Social History  Substance Use Topics  . Smoking status: Current Every Day Smoker  . Smokeless tobacco: Never Used  . Alcohol Use: No    Review of Systems Constitutional: No fever/chills Eyes: No visual changes. ENT: No sore throat. Cardiovascular: Denies chest pain. Respiratory: Denies shortness of breath. Gastrointestinal: Acute onset upper abdominal pain with nausea, no vomiting.  No diarrhea.  No constipation. Genitourinary: Negative for dysuria. Musculoskeletal: Negative for back pain. Skin: Negative for rash. Neurological: Negative for headaches, focal weakness or numbness.  10-point ROS otherwise negative.  ____________________________________________   PHYSICAL EXAM:  VITAL SIGNS: ED Triage Vitals  Enc Vitals Group     BP 04/23/15 0518 110/73 mmHg     Pulse Rate 04/23/15  0518 76     Resp 04/23/15 0518 16     Temp 04/23/15 0518 97.5 F (36.4 C)     Temp Source 04/23/15 0518 Oral     SpO2 04/23/15 0518 100 %     Weight 04/23/15 0518 204 lb (92.534 kg)     Height 04/23/15 0518  (1.753 m)     Head Cir --      Peak Flow --      Pain Score 04/23/15 0519 10     Pain Loc --      Pain Edu? --      Excl. in GC? --     Constitutional: Alert and oriented. Well appearing and in no acute distress. Eyes: Conjunctivae are normal. PERRL. EOMI. Head: Atraumatic. Nose: No congestion/rhinnorhea. Mouth/Throat: Mucous membranes are moist.  Oropharynx non-erythematous. Neck: No stridor.   Cardiovascular: Normal rate, regular rhythm. Grossly normal heart sounds.  Good peripheral circulation. Respiratory: Normal respiratory effort.  No retractions. Lungs CTAB. Gastrointestinal: Soft with tenderness to palpation of the epigastrium and right upper quadrant with a positive Murphy's sign.  No pain/tenderness at McBurney's point.  No rebound tenderness.  No distention. No abdominal bruits. No CVA tenderness. Musculoskeletal: No lower extremity tenderness nor edema.  No joint effusions. Neurologic:  Normal speech and language. No gross focal neurologic deficits are appreciated.  Skin:  Skin is warm, dry and intact. No rash noted.   ____________________________________________   LABS (all labs ordered are listed, but only abnormal results are displayed)  Labs Reviewed  COMPREHENSIVE METABOLIC PANEL - Abnormal; Notable for the following:    Potassium 3.4 (*)    Glucose, Bld 120 (*)    All other components within normal limits  CBC WITH DIFFERENTIAL/PLATELET - Abnormal; Notable for the following:    WBC 12.0 (*)    Neutro Abs 6.7 (*)    Lymphs Abs 4.4 (*)    All other components within normal limits  URINALYSIS COMPLETEWITH MICROSCOPIC ([facility] ONLY) - Abnormal; Notable for the following:    Color, Urine YELLOW (*)    APPearance CLOUDY (*)    Hgb urine dipstick 1+  (*)    Protein, ur 30 (*)    Leukocytes, UA 1+ (*)    Bacteria, UA FEW (*)    Squamous Epithelial / LPF 6-30 (*)    All other components within normal limits  POCT PREGNANCY, URINE - Abnormal; Notable for the following:    Preg Test, Ur POSITIVE (*)    All other components within normal limits  URINE CULTURE  LIPASE, BLOOD   ____________________________________________  EKG  Not indicated ____________________________________________  RADIOLOGY   US Abdomen Limited Ruq  04/23/2015  CLINICAL DATA:  Acute onset of epigastric and right upper quadrant abdominal pain. Initial encounter. EXAM: US ABDOMEN LIMITED - RIGHT UPPER QUADRANT COMPARISON:  Right upper quadrant abdominal ultrasound performed 08/04/2012 FINDINGS: Gallbladder: Two small stones are seen in the gallbladder, measuring up to 4 mm in size. No gallbladder wall thickening or pericholecystic fluid is seen. No ultrasonographic Murphy's sign is elicited. Common bile duct: Diameter: 0.4 cm, within normal limits in caliber. Liver: No focal lesion identified. Within normal limits in parenchymal echogenicity. IMPRESSION: Mild cholelithiasis noted. Gallbladder otherwise unremarkable in appearance. No acute abnormality seen at the right upper quadrant. Electronically Signed   By: Roanna RaiderJeffery  Chang M.D.   On: 04/23/2015 06:54    ____________________________________________   PROCEDURES  Procedure(s) performed: None  Critical Care performed: No ____________________________________________   INITIAL IMPRESSION / ASSESSMENT AND PLAN / ED COURSE  Pertinent labs & imaging results that were available during my care of the patient were reviewed by me and considered in my medical decision making (see chart for details).  I do not believe her current pain is related to her pregnancy.  The top items him on differential diagnosis include GERD and biliary colic.  I will keep her nothing by mouth for now and evaluate with labs and a right  upper quadrant ultrasound.  If they will shunt is unremarkable we will try a GI cocktail.  If the patient's pain becomes more severe (she is relatively comfortable at this time) we will give her analgesia but currently will hold off since she is [redacted] weeks pregnant.  She understands and agrees with this plan as does her husband.  ----------------------------------------- 7:14 AM on 04/23/2015 -----------------------------------------  The patient feels better after Percocet and a GI cocktail.  She has gallstones but no evidence of cholecystitis.  I discussed results with her and my recommendations for treatment of both GERD and biliary colic.  I am also treating her with Macrobid for asymptomatic bacteriuria during pregnancy.  I sent a urine culture as well.  I gave my usual and customary return precautions.    ____________________________________________  FINAL CLINICAL IMPRESSION(S) / ED DIAGNOSES  Final diagnoses:  Gastroesophageal reflux disease without esophagitis  Gallstones  Bacteriuria during pregnancy      NEW MEDICATIONS STARTED DURING THIS VISIT:  New Prescriptions   DOCUSATE SODIUM (COLACE) 100 MG CAPSULE    Take 1 tablet once or twice daily as needed for constipation while taking narcotic pain medicine   HYDROCODONE-ACETAMINOPHEN (NORCO/VICODIN) 5-325 MG TABLET    Take 1-2 tablets by mouth every 4 (four) hours as needed for moderate pain.   NITROFURANTOIN, MACROCRYSTAL-MONOHYDRATE, (MACROBID) 100 MG CAPSULE    Take 1 capsule (100 mg total) by mouth 2 (two) times daily.   ONDANSETRON (ZOFRAN) 4 MG TABLET    Take 1-2 tabs by mouth every 8 hours as needed for nausea/vomiting     Loleta Roseory Brea Coleson, MD 04/23/15 [phone]

## 2015-04-23 NOTE — ED Notes (Addendum)
Pt arrives to ED via POV with c/o abdominal pain that started at 4am this morning. Pt denies N/V/D; reports pain to mid abdomen area w/o radiation. Pt reports being [redacted] weeks pregnant; denies any vaginal bleeding or d/c. Pt presents in mild distress, A&O, with respirations even, regular and unlabored.

## 2015-04-23 NOTE — Discharge Instructions (Signed)
You have been seen in the Emergency Department (ED) for abdominal pain.  Your evaluation suggests that your pain is caused by gallstones, acid reflux, or both.  Fortunately you do not need immediate surgery at this time, but it is important that you follow up with a surgeon as an outpatient; typically surgical removal of the gallbladder is the only thing that will definitively fix your issue.  Read through the included information about a bland diet, and use any prescribed medications as instructed.  Avoid smoking and alcohol use.  We also recommend that you take an over-the-counter antacid medication such as Prilosec OTC to help prevent additional symptos.  Please follow up as instructed above regarding todays emergent visit and the symptoms that are bothering you.  Take Norco as prescribed for severe pain. Do not drink alcohol, drive or participate in any other potentially dangerous activities while taking this medication as it may make you sleepy. Do not take this medication with any other sedating medications, either prescription or over-the-counter. If you were prescribed Percocet or Vicodin, do not take these with acetaminophen (Tylenol) as it is already contained within these medications.  This medication may affect your pregnancy, so please only use it if absolutely necessary.   This medication is an opiate (or narcotic) pain medication and can be habit forming.  Use it as little as possible to achieve adequate pain control.  Do not use or use it with extreme caution if you have a history of opiate abuse or dependence.  If you are on a pain contract with your primary care doctor or a pain specialist, be sure to let them know you were prescribed this medication today from the [facility] Emergency Department.  This medication is intended for your use only - do not give any to anyone else and keep it in a secure place where nobody else, especially children, have access to it.  It will also  cause or worsen constipation, so you may want to consider taking an over-the-counter stool softener while you are taking this medication.  Return to the ED if your abdominal pain worsens or fails to improve, you develop bloody vomiting, bloody diarrhea, you are unable to tolerate fluids due to vomiting, fever greater than 101, or other symptoms that concern you.   Biliary Colic Biliary colic is a pain in the upper abdomen. The pain:  Is usually felt on the right side of the abdomen, but it may also be felt in the center of the abdomen, just below the breastbone (sternum).  May spread back toward the right shoulder blade.  May be steady or irregular.  May be accompanied by nausea and vomiting. Most of the time, the pain goes away in 1-5 hours. After the most intense pain passes, the abdomen may continue to ache mildly for about 24 hours. Biliary colic is caused by a blockage in the bile duct. The bile duct is a pathway that carries bile--a liquid that helps to digest fats--from the gallbladder to the small intestine. Biliary colic usually occurs after eating, when the digestive system demands bile. The pain develops when muscle cells contract forcefully to try to move the blockage so that bile can get by. HOME CARE INSTRUCTIONS  Take medicines only as directed by your health care provider.  Drink enough fluid to keep your urine clear or pale yellow.  Avoid fatty, greasy, and fried foods. These kinds of foods increase your body's demand for bile.  Avoid any foods that make your  pain worse.  Avoid overeating.  Avoid having a large meal after fasting. SEEK MEDICAL CARE IF:  You develop a fever.  Your pain gets worse.  You vomit.  You develop nausea that prevents you from eating and drinking. SEEK IMMEDIATE MEDICAL CARE IF:  You suddenly develop a fever and shaking chills.  You develop a yellowish discoloration (jaundice) of:  Skin.  Whites of the eyes.  Mucous  membranes.  You have continuous or severe pain that is not relieved with medicines.  You have nausea and vomiting that is not relieved with medicines.  You develop dizziness or you faint.   This information is not intended to replace advice given to you by your health care provider. Make sure you discuss any questions you have with your health care provider.   Document Released: 09/16/2005 Document Revised: 08/30/2014 Document Reviewed: 01/25/2014 Elsevier Interactive Patient Education 2016 [employer]  Gastroesophageal Reflux Disease, Adult Normally, food travels down the esophagus and stays in the stomach to be digested. However, when a person has gastroesophageal reflux disease (GERD), food and stomach acid move back up into the esophagus. When this happens, the esophagus becomes sore and inflamed. Over time, GERD can create small holes (ulcers) in the lining of the esophagus.  CAUSES This condition is caused by a problem with the muscle between the esophagus and the stomach (lower esophageal sphincter, or LES). Normally, the LES muscle closes after food passes through the esophagus to the stomach. When the LES is weakened or abnormal, it does not close properly, and that allows food and stomach acid to go back up into the esophagus. The LES can be weakened by certain dietary substances, medicines, and medical conditions, including:  Tobacco use.  Pregnancy.  Having a hiatal hernia.  Heavy alcohol use.  Certain foods and beverages, such as coffee, chocolate, onions, and peppermint. RISK FACTORS This condition is more likely to develop in:  People who have an increased body weight.  People who have connective tissue disorders.  People who use NSAID medicines. SYMPTOMS Symptoms of this condition include:  Heartburn.  Difficult or painful swallowing.  The feeling of having a lump in the throat.  Abitter taste in the mouth.  Bad breath.  Having a large amount of  saliva.  Having an upset or bloated stomach.  Belching.  Chest pain.  Shortness of breath or wheezing.  Ongoing (chronic) cough or a night-time cough.  Wearing away of tooth enamel.  Weight loss. Different conditions can cause chest pain. Make sure to see your health care provider if you experience chest pain. DIAGNOSIS Your health care provider will take a medical history and perform a physical exam. To determine if you have mild or severe GERD, your health care provider may also monitor how you respond to treatment. You may also have other tests, including:  An endoscopy toexamine your stomach and esophagus with a small camera.  A test thatmeasures the acidity level in your esophagus.  A test thatmeasures how much pressure is on your esophagus.  A barium swallow or modified barium swallow to show the shape, size, and functioning of your esophagus. TREATMENT The goal of treatment is to help relieve your symptoms and to prevent complications. Treatment for this condition may vary depending on how severe your symptoms are. Your health care provider may recommend:  Changes to your diet.  Medicine.  Surgery. HOME CARE INSTRUCTIONS Diet  Follow a diet as recommended by your health care provider. This may involve avoiding  foods and drinks such as:  Coffee and tea (with or without caffeine).  Drinks that containalcohol.  Energy drinks and sports drinks.  Carbonated drinks or sodas.  Chocolate and cocoa.  Peppermint and mint flavorings.  Garlic and onions.  Horseradish.  Spicy and acidic foods, including peppers, chili powder, curry powder, vinegar, hot sauces, and barbecue sauce.  Citrus fruit juices and citrus fruits, such as oranges, lemons, and limes.  Tomato-based foods, such as red sauce, chili, salsa, and pizza with red sauce.  Fried and fatty foods, such as donuts, french fries, potato chips, and high-fat dressings.  High-fat meats, such as hot dogs  and fatty cuts of red and white meats, such as rib eye steak, sausage, ham, and bacon.  High-fat dairy items, such as whole milk, butter, and cream cheese.  Eat small, frequent meals instead of large meals.  Avoid drinking large amounts of liquid with your meals.  Avoid eating meals during the 2-3 hours before bedtime.  Avoid lying down right after you eat.  Do not exercise right after you eat. General Instructions  Pay attention to any changes in your symptoms.  Take over-the-counter and prescription medicines only as told by your health care provider. Do not take aspirin, ibuprofen, or other NSAIDs unless your health care provider told you to do so.  Do not use any tobacco products, including cigarettes, chewing tobacco, and e-cigarettes. If you need help quitting, ask your health care provider.  Wear loose-fitting clothing. Do not wear anything tight around your waist that causes pressure on your abdomen.  Raise (elevate) the head of your bed 6 inches (15cm).  Try to reduce your stress, such as with yoga or meditation. If you need help reducing stress, ask your health care provider.  If you are overweight, reduce your weight to an amount that is healthy for you. Ask your health care provider for guidance about a safe weight loss goal.  Keep all follow-up visits as told by your health care provider. This is important. SEEK MEDICAL CARE IF:  You have new symptoms.  You have unexplained weight loss.  You have difficulty swallowing, or it hurts to swallow.  You have wheezing or a persistent cough.  Your symptoms do not improve with treatment.  You have a hoarse voice. SEEK IMMEDIATE MEDICAL CARE IF:  You have pain in your arms, neck, jaw, teeth, or back.  You feel sweaty, dizzy, or light-headed.  You have chest pain or shortness of breath.  You vomit and your vomit looks like blood or coffee grounds.  You faint.  Your stool is bloody or black.  You cannot  swallow, drink, or eat.   This information is not intended to replace advice given to you by your health care provider. Make sure you discuss any questions you have with your health care provider.   Document Released: 01/23/2005 Document Revised: 01/04/2015 Document Reviewed: 08/10/2014 Elsevier Interactive Patient Education 2016 [employer].  Food Choices for Gastroesophageal Reflux Disease, Adult When you have gastroesophageal reflux disease (GERD), the foods you eat and your eating habits are very important. Choosing the right foods can help ease the discomfort of GERD. WHAT GENERAL GUIDELINES DO I NEED TO FOLLOW?  Choose fruits, vegetables, whole grains, low-fat dairy products, and low-fat meat, fish, and poultry.  Limit fats such as oils, salad dressings, butter, nuts, and avocado.  Keep a food diary to identify foods that cause symptoms.  Avoid foods that cause reflux. These may be different for different  people.  Eat frequent small meals instead of three large meals each day.  Eat your meals slowly, in a relaxed setting.  Limit fried foods.  Cook foods using methods other than frying.  Avoid drinking alcohol.  Avoid drinking large amounts of liquids with your meals.  Avoid bending over or lying down until 2-3 hours after eating. WHAT FOODS ARE NOT RECOMMENDED? The following are some foods and drinks that may worsen your symptoms: Vegetables Tomatoes. Tomato juice. Tomato and spaghetti sauce. Chili peppers. Onion and garlic. Horseradish. Fruits Oranges, grapefruit, and lemon (fruit and juice). Meats High-fat meats, fish, and poultry. This includes hot dogs, ribs, ham, sausage, salami, and bacon. Dairy Whole milk and chocolate milk. Sour cream. Cream. Butter. Ice cream. Cream cheese.  Beverages Coffee and tea, with or without caffeine. Carbonated beverages or energy drinks. Condiments Hot sauce. Barbecue sauce.  Sweets/Desserts Chocolate and cocoa. Donuts.  Peppermint and spearmint. Fats and Oils High-fat foods, including [place] fries and potato chips. Other Vinegar. Strong spices, such as black pepper, white pepper, red pepper, cayenne, curry powder, cloves, ginger, and chili powder. The items listed above may not be a complete list of foods and beverages to avoid. Contact your dietitian for more information.   This information is not intended to replace advice given to you by your health care provider. Make sure you discuss any questions you have with your health care provider.   Document Released: 04/15/2005 Document Revised: 05/06/2014 Document Reviewed: 02/17/2013 Elsevier Interactive Patient Education [employer]

## 2015-04-25 ENCOUNTER — Encounter: Payer: Self-pay | Admitting: Certified Nurse Midwife

## 2015-04-25 ENCOUNTER — Ambulatory Visit (INDEPENDENT_AMBULATORY_CARE_PROVIDER_SITE_OTHER): Payer: BLUE CROSS/BLUE SHIELD | Admitting: Certified Nurse Midwife

## 2015-04-25 VITALS — BP 107/78 | HR 80 | Wt 210.6 lb

## 2015-04-25 DIAGNOSIS — Z36 Encounter for antenatal screening of mother: Secondary | ICD-10-CM

## 2015-04-25 DIAGNOSIS — O9989 Other specified diseases and conditions complicating pregnancy, childbirth and the puerperium: Secondary | ICD-10-CM

## 2015-04-25 DIAGNOSIS — Z283 Underimmunization status: Secondary | ICD-10-CM

## 2015-04-25 DIAGNOSIS — Z2839 Other underimmunization status: Secondary | ICD-10-CM

## 2015-04-25 DIAGNOSIS — F329 Major depressive disorder, single episode, unspecified: Secondary | ICD-10-CM

## 2015-04-25 DIAGNOSIS — O09899 Supervision of other high risk pregnancies, unspecified trimester: Secondary | ICD-10-CM

## 2015-04-25 DIAGNOSIS — Z789 Other specified health status: Secondary | ICD-10-CM

## 2015-04-25 DIAGNOSIS — Z1389 Encounter for screening for other disorder: Secondary | ICD-10-CM

## 2015-04-25 DIAGNOSIS — F419 Anxiety disorder, unspecified: Secondary | ICD-10-CM

## 2015-04-25 DIAGNOSIS — Z369 Encounter for antenatal screening, unspecified: Secondary | ICD-10-CM

## 2015-04-25 DIAGNOSIS — Z3402 Encounter for supervision of normal first pregnancy, second trimester: Secondary | ICD-10-CM

## 2015-04-25 DIAGNOSIS — R8271 Bacteriuria: Secondary | ICD-10-CM

## 2015-04-25 DIAGNOSIS — F32A Depression, unspecified: Secondary | ICD-10-CM

## 2015-04-25 DIAGNOSIS — F1310 Sedative, hypnotic or anxiolytic abuse, uncomplicated: Secondary | ICD-10-CM

## 2015-04-25 LAB — URINE CULTURE: Special Requests: NORMAL

## 2015-04-25 LAB — POCT URINALYSIS DIPSTICK
Bilirubin, UA: NEGATIVE
Glucose, UA: NEGATIVE
KETONES UA: NEGATIVE
Nitrite, UA: NEGATIVE
PH UA: 6
SPEC GRAV UA: 1.025
UROBILINOGEN UA: NEGATIVE

## 2015-04-25 MED ORDER — SERTRALINE HCL 50 MG PO TABS: 50.0000 mg | ORAL_TABLET | Freq: Every day | ORAL | Status: DC

## 2015-04-25 MED ORDER — BUSPIRONE HCL 5 MG PO TABS: 5.0000 mg | ORAL_TABLET | Freq: Three times a day (TID) | ORAL | Status: DC

## 2015-04-25 NOTE — Progress Notes (Signed)
NEW OB HISTORY AND PHYSICAL  SUBJECTIVE:       Shelley Graham is a 19 y.o. G1P0 female, Patient's last menstrual period was 01/26/2015 (approximate)., Estimated Date of Delivery: 11/02/15, [id number], presents today for establishment of Prenatal Care. She has no unusual complaints and complains of anxiety and depression that has become worse.  She took a friwnds xanax to help with her anxiety.  She also admits to smoking marijuana twice this pregnancy for anxiety.      Gynecologic History Patient's last menstrual period was 01/26/2015 (approximate). Normal, Abnormal and Unknown Contraception: none Last Pap: not 21. Results were: n/a  Obstetric History OB History  Gravida Para Term Preterm AB SAB TAB Ectopic Multiple Living  1             # Outcome Date GA Lbr Len/2nd Weight Sex Delivery Anes PTL Lv  1 Current               Past Medical History  Diagnosis Date  . History of migraine headaches   . Hx of chlamydia infection   . Anxiety   . Depression   . UTI (lower urinary tract infection)     RESOLVED    Past Surgical History  Procedure Laterality Date  . Other surgical history      tubes ears 18 months    Current Outpatient Prescriptions on File Prior to Visit  Medication Sig Dispense Refill  . amoxicillin (AMOXIL) 500 MG capsule Take 1 capsule (500 mg total) by mouth 3 (three) times daily. 21 capsule 2  . Cetirizine HCl 10 MG CAPS Take 1 capsule (10 mg total) by mouth daily. 30 capsule 3  . docusate sodium (COLACE) 100 MG capsule Take 1 tablet once or twice daily as needed for constipation while taking narcotic pain medicine 30 capsule 0  . HYDROcodone-acetaminophen (NORCO/VICODIN) 5-325 MG tablet Take 1-2 tablets by mouth every 4 (four) hours as needed for moderate pain. 15 tablet 0  . nitrofurantoin, macrocrystal-monohydrate, (MACROBID) 100 MG capsule Take 1 capsule (100 mg total) by mouth 2 (two) times daily. 14 capsule 0  . ondansetron (ZOFRAN) 4 MG tablet Take 1-2 tabs  by mouth every 8 hours as needed for nausea/vomiting 30 tablet 0   No current facility-administered medications on file prior to visit.    No Known Allergies  Social History   Social History  . Marital Status: Single    Spouse Name: N/A  . Number of Children: N/A  . Years of Education: N/A   Occupational History  . Not on file.   Social History Main Topics  . Smoking status: Former Smoker -- 0.25 packs/day for 6 years    Types: Cigarettes    Quit date: 03/15/2015  . Smokeless tobacco: Never Used  . Alcohol Use: No  . Drug Use: Yes    Special: Benzodiazepines, Marijuana     Comment: uses with anxiety for 6 years  . Sexual Activity:    Partners: Male   Other Topics Concern  . Not on file   Social History Narrative    Family History  Problem Relation Age of Onset  . Asthma Mother     as child  . Asthma Brother   . Cancer Father   . Cancer Maternal Grandmother     colon  . Cancer Paternal Grandfather     lung  . Migraines Mother   . Thyroid disease Mother     The following portions of the patient's history were reviewed  and updated as appropriate: allergies, current medications, past OB history, past medical history, past surgical history, past family history, past social history, and problem list.    OBJECTIVE: Initial Physical Exam (New OB)  GENERAL APPEARANCE: alert, well appearing, anxious, in mild to moderate distress HEAD: normocephalic, atraumatic MOUTH: mucous membranes moist, pharynx normal without lesions THYROID: no thyromegaly or masses present BREASTS: no masses noted, no significant tenderness, no palpable axillary nodes, no skin changes LUNGS: clear to auscultation, no wheezes, rales or rhonchi, symmetric air entry HEART: regular rate and rhythm, no murmurs ABDOMEN: soft, nontender, nondistended, no abnormal masses, no epigastric pain and obese EXTREMITIES: no redness or tenderness in the calves or thighs, no edema, Homan's sign negative  bilaterally SKIN: normal coloration and turgor, no rashes LYMPH NODES: no adenopathy palpable NEUROLOGIC: alert, oriented, normal speech, no focal findings or movement disorder noted  PELVIC EXAM EXTERNAL GENITALIA: normal appearing vulva with no masses, tenderness or lesions VAGINA: no abnormal discharge or lesions CERVIX: no lesions or cervical motion tenderness UTERUS: gravid and consistent with 13 weeks ADNEXA: no masses palpable and nontender OB EXAM PELVIMETRY: appears adequate RECTUM: exam not indicated  ASSESSMENT: Pregnancy Anxiety Depression Marijuana & Benzo use Smoker  PLAN: Prenatal care [place] today Anxiety -Medications:Zoloft 50 mg, Buspar 5 mg TID -Counseling encouraged -Denies feelings of harm to self or others -Counseled on risks of persistent Pulmonary hypertension (1%) at delivery and transient neonatal behavior changes with Zoloft use. -benefits of Zoloft & Buspar is decrease in symptoms -instructed to walk 30-60 minutes most days of the week   -instructed to get 10 minutes of sunshine daily to increase neurotransmitters

## 2015-04-25 NOTE — Progress Notes (Signed)
Pt here for NOB physical.  Was seen in ER on 04/23/15 for severe RUQ pain. Noted 2 small gallstones. Also treated for UTI. Was given zofran for nausea and Vicodin,  #15 tabs.

## 2015-04-25 NOTE — Patient Instructions (Signed)

## 2015-04-26 NOTE — Progress Notes (Signed)
Patient was seen in the ED on 12/25 and was diagnosed with GERD and asymptomatic bacteriuria. Patient was prescribed nitrofurantoin 100 mg PO BID x 7 days for asymptomatic bacteriuria since patient is [redacted] weeks pregnant.  Urine culture resulted today with >100K MSSA, which will not be covered with nitrofurantoin. Spoke with MD regarding change in antibiotics. Attempted to contact patient at cell number on file [phone]). Left message to return phone call to [facility].  Jodelle RedMary M Dashia Caldeira 3:35 PM

## 2015-04-27 LAB — PAIN MGT SCRN (14 DRUGS), UR
Amphetamine Screen, Ur: NEGATIVE ng/mL
Barbiturate Screen, Ur: POSITIVE ng/mL
Benzodiazepine Screen, Urine: POSITIVE ng/mL
Buprenorphine, Urine: NEGATIVE ng/mL
Cannabinoids Ur Ql Scn: NEGATIVE ng/mL
Cocaine(Metab.)Screen, Urine: NEGATIVE ng/mL
Creatinine(Crt), U: 263 mg/dL (ref 20.0–300.0)
Fentanyl, Urine: NEGATIVE pg/mL
Meperidine Screen, Urine: NEGATIVE ng/mL
Methadone Scn, Ur: NEGATIVE ng/mL
Opiate Scrn, Ur: POSITIVE ng/mL
Oxycodone+Oxymorphone Ur Ql Scn: NEGATIVE ng/mL
PCP Scrn, Ur: NEGATIVE ng/mL
Ph of Urine: 5.9 (ref 4.5–8.9)
Propoxyphene, Screen: NEGATIVE ng/mL
Tramadol Ur Ql Scn: NEGATIVE ng/mL

## 2015-04-28 LAB — URINE CULTURE, OB REFLEX: ORGANISM ID, BACTERIA: NO GROWTH

## 2015-04-28 LAB — CULTURE, OB URINE

## 2015-04-30 NOTE — L&D Delivery Note (Signed)
Delivery Note At 11:16am a viable and healthy female "Shelley Graham"was delivered via  (Presentation:OA ;  ) via outlet vacuum assist due to poor maternal pushing effort.  APGAR:8 , 9; weight  8#10oz  .   Placenta status: delivered intact with 3 vessel Cord:  with the following complications: x1, postpartum hemorrhage .   Anesthesia:  Epidural and local Episiotomy:  none Lacerations:  2nd degree. Superficial labial lacerations, and cervical laceration Suture Repair: 3.0 chromic vicryl rapide Est. Blood Loss (mL):  >1500 (currently counting and weighing laps) and awaiting STAT CBC After placenta delivered spontaneously, excessive bleeding noted and uterus was floppy and not firming up with BiManual, 800mg  cytotec placed rectally at 11:19am, and manual exploration revealed a large clot at the cervix, removed it and continued bimanual pressure; Dr Valentino Saxonherry called and in route at 11:25am and 2mg  Stadol IVP given. 2nd IV started at 11:30 and 100mcg fentanyl IVP given at 11:32 bleeding continued moderately, and Hemabate IM given around 11:40. Vaginal lap placed and repair of 2nd degree laceration with bleeder vessel done.  Sterile speculum exam revealed cervical laceration with moderate blood flow coming from it, uterus is firm at that time. VS monitored the entire time with BP elevated 150-140s/90-130s, HR 140-155, O2 Sat >100%. Patient never got pain relief, and Dr Valentino Saxonherry resumed attempt at repair of cervical laceration, and due to pain intolerance, took patient to OR for anesthesia and repair. 1st unit PRBC started before leaving room for OR.   Mom to OR.  Baby to Nursery.  Shelley Graham Shelley Graham, CNM 11/03/2015, 12:03 PM

## 2015-05-05 ENCOUNTER — Encounter: Payer: Self-pay | Admitting: Certified Nurse Midwife

## 2015-05-08 ENCOUNTER — Telehealth: Payer: Self-pay

## 2015-05-08 NOTE — Telephone Encounter (Signed)
Was trying to reach pt for panorama results but was unable to do so. Mobile number, the person there asked not to call her unless there was an emergency. And the home/work number is a non-working number.

## 2015-05-23 ENCOUNTER — Encounter: Payer: BLUE CROSS/BLUE SHIELD | Admitting: Obstetrics and Gynecology

## 2015-05-23 ENCOUNTER — Encounter: Payer: BLUE CROSS/BLUE SHIELD | Admitting: Certified Nurse Midwife

## 2015-05-25 ENCOUNTER — Ambulatory Visit (INDEPENDENT_AMBULATORY_CARE_PROVIDER_SITE_OTHER): Payer: BLUE CROSS/BLUE SHIELD | Admitting: Obstetrics and Gynecology

## 2015-05-25 ENCOUNTER — Encounter: Payer: Self-pay | Admitting: Obstetrics and Gynecology

## 2015-05-25 ENCOUNTER — Other Ambulatory Visit: Payer: Self-pay | Admitting: Obstetrics and Gynecology

## 2015-05-25 VITALS — BP 105/83 | HR 118 | Wt 207.4 lb

## 2015-05-25 DIAGNOSIS — K8000 Calculus of gallbladder with acute cholecystitis without obstruction: Secondary | ICD-10-CM

## 2015-05-25 DIAGNOSIS — F329 Major depressive disorder, single episode, unspecified: Secondary | ICD-10-CM

## 2015-05-25 DIAGNOSIS — F32A Depression, unspecified: Secondary | ICD-10-CM

## 2015-05-25 DIAGNOSIS — F1290 Cannabis use, unspecified, uncomplicated: Secondary | ICD-10-CM

## 2015-05-25 DIAGNOSIS — Z331 Pregnant state, incidental: Secondary | ICD-10-CM

## 2015-05-25 LAB — POCT URINALYSIS DIPSTICK
BILIRUBIN UA: NEGATIVE
Blood, UA: NEGATIVE
GLUCOSE UA: NEGATIVE
Ketones, UA: NEGATIVE
LEUKOCYTES UA: NEGATIVE
NITRITE UA: NEGATIVE
Spec Grav, UA: 1.015
Urobilinogen, UA: 0.2
pH, UA: 6

## 2015-05-25 MED ORDER — OXYCODONE-ACETAMINOPHEN 5-325 MG PO TABS: 1.0000 | ORAL_TABLET | Freq: Four times a day (QID) | ORAL | Status: DC | PRN

## 2015-05-25 MED ORDER — RANITIDINE HCL 150 MG PO TABS: 150.0000 mg | ORAL_TABLET | Freq: Two times a day (BID) | ORAL | Status: DC

## 2015-05-25 MED ORDER — SERTRALINE HCL 100 MG PO TABS: 50.0000 mg | ORAL_TABLET | Freq: Every day | ORAL | Status: DC

## 2015-05-25 NOTE — Progress Notes (Signed)
ROB- patient upset as Zoloft did not help her depression at all and Gallbladder pain is occuring each night, keeping her awake, has been trying to adhere to diet and nothing but percocet seems to help.  Denies smoking MJ (although room smells like it-states dad smokes it in house with her) but does admit to one does Xanax and 1 percocet in last week.  Feels very stressed about pregnancy and has little support.  Lives with her father.   Recommend increasing dose of Zoloft and new rx given for  x 2 weeks then increase to  daily if needed. Anatomy scan planned in 2 weeks and will f/u on anxiety med then. Also reiterated not using MJ and taking xanax and patient agrees to stay away from.  Prescription given for zantac  bid and percocet #30 to use sparingly.

## 2015-05-25 NOTE — Progress Notes (Signed)
ROB-pt has gallstones, states she cant eat, doesn't have much of appetite, not sleeping well

## 2015-05-27 LAB — PAIN MGT SCRN (14 DRUGS), UR
AMPHETAMINE SCRN UR: NEGATIVE ng/mL
BARBITURATE SCRN UR: NEGATIVE ng/mL
BENZODIAZEPINE SCREEN, URINE: POSITIVE ng/mL
Buprenorphine, Urine: NEGATIVE ng/mL
CANNABINOIDS UR QL SCN: POSITIVE ng/mL
CREATININE(CRT), U: 208.8 mg/dL (ref 20.0–300.0)
Cocaine(Metab.)Screen, Urine: NEGATIVE ng/mL
FENTANYL, URINE: NEGATIVE pg/mL
MEPERIDINE SCREEN, URINE: NEGATIVE ng/mL
Methadone Scn, Ur: NEGATIVE ng/mL
OPIATE SCRN UR: NEGATIVE ng/mL
OXYCODONE+OXYMORPHONE UR QL SCN: NEGATIVE ng/mL
PCP Scrn, Ur: NEGATIVE ng/mL
Ph of Urine: 5.7 (ref 4.5–8.9)
Propoxyphene, Screen: NEGATIVE ng/mL
Tramadol Ur Ql Scn: NEGATIVE ng/mL

## 2015-05-30 ENCOUNTER — Telehealth: Payer: Self-pay | Admitting: Obstetrics and Gynecology

## 2015-05-30 NOTE — Telephone Encounter (Signed)
Pt stated that she has lost her pocketbook and her RX for her gallbladder was in there and she needs another RX for that. She uses [facility] on [address]

## 2015-05-31 NOTE — Telephone Encounter (Signed)
I cannot refill narcotic under any circumstances. She will have to wait until next appointment to get another prescription.  Also please call local pharmacies to see if she has filled it anywhere.

## 2015-06-05 NOTE — Telephone Encounter (Signed)
Notified pt she cant get anymore medication

## 2015-06-09 ENCOUNTER — Ambulatory Visit (INDEPENDENT_AMBULATORY_CARE_PROVIDER_SITE_OTHER): Payer: Medicaid Other

## 2015-06-09 DIAGNOSIS — Z331 Pregnant state, incidental: Secondary | ICD-10-CM

## 2015-06-14 ENCOUNTER — Observation Stay
Admission: EM | Admit: 2015-06-14 | Discharge: 2015-06-14 | Disposition: A | Discharge status: Home / Self Care | Payer: Medicaid Other | Attending: Obstetrics and Gynecology | Admitting: Obstetrics and Gynecology

## 2015-06-14 ENCOUNTER — Encounter: Payer: Self-pay | Admitting: *Deleted

## 2015-06-14 ENCOUNTER — Telehealth: Payer: Self-pay | Admitting: Obstetrics and Gynecology

## 2015-06-14 DIAGNOSIS — R109 Unspecified abdominal pain: Secondary | ICD-10-CM | POA: Diagnosis present

## 2015-06-14 DIAGNOSIS — O99342 Other mental disorders complicating pregnancy, second trimester: Secondary | ICD-10-CM | POA: Diagnosis not present

## 2015-06-14 DIAGNOSIS — Z87891 Personal history of nicotine dependence: Secondary | ICD-10-CM | POA: Insufficient documentation

## 2015-06-14 DIAGNOSIS — O99322 Drug use complicating pregnancy, second trimester: Secondary | ICD-10-CM | POA: Diagnosis not present

## 2015-06-14 DIAGNOSIS — K8000 Calculus of gallbladder with acute cholecystitis without obstruction: Secondary | ICD-10-CM

## 2015-06-14 DIAGNOSIS — N3090 Cystitis, unspecified without hematuria: Secondary | ICD-10-CM

## 2015-06-14 DIAGNOSIS — R112 Nausea with vomiting, unspecified: Secondary | ICD-10-CM

## 2015-06-14 DIAGNOSIS — F1290 Cannabis use, unspecified, uncomplicated: Secondary | ICD-10-CM

## 2015-06-14 DIAGNOSIS — F419 Anxiety disorder, unspecified: Secondary | ICD-10-CM | POA: Diagnosis not present

## 2015-06-14 DIAGNOSIS — O26892 Other specified pregnancy related conditions, second trimester: Secondary | ICD-10-CM | POA: Diagnosis not present

## 2015-06-14 DIAGNOSIS — O2342 Unspecified infection of urinary tract in pregnancy, second trimester: Secondary | ICD-10-CM | POA: Insufficient documentation

## 2015-06-14 DIAGNOSIS — Z3A19 19 weeks gestation of pregnancy: Secondary | ICD-10-CM | POA: Insufficient documentation

## 2015-06-14 DIAGNOSIS — F329 Major depressive disorder, single episode, unspecified: Secondary | ICD-10-CM | POA: Diagnosis not present

## 2015-06-14 LAB — URINALYSIS COMPLETE WITH MICROSCOPIC (ARMC ONLY)
Bilirubin Urine: NEGATIVE
GLUCOSE, UA: NEGATIVE mg/dL
HGB URINE DIPSTICK: NEGATIVE
Ketones, ur: NEGATIVE mg/dL
NITRITE: NEGATIVE
PROTEIN: NEGATIVE mg/dL
SPECIFIC GRAVITY, URINE: 1.023 (ref 1.005–1.030)
pH: 7 (ref 5.0–8.0)

## 2015-06-14 LAB — CBC
HEMATOCRIT: 38.2 % (ref 35.0–47.0)
HEMOGLOBIN: 13 g/dL (ref 12.0–16.0)
MCH: 30.3 pg (ref 26.0–34.0)
MCHC: 34 g/dL (ref 32.0–36.0)
MCV: 89 fL (ref 80.0–100.0)
Platelets: 248 10*3/uL (ref 150–440)
RBC: 4.29 MIL/uL (ref 3.80–5.20)
RDW: 13.4 % (ref 11.5–14.5)
WBC: 16.5 10*3/uL — AB (ref 3.6–11.0)

## 2015-06-14 LAB — COMPREHENSIVE METABOLIC PANEL
ALBUMIN: 3.3 g/dL — AB (ref 3.5–5.0)
ALT: 25 U/L (ref 14–54)
ANION GAP: 9 (ref 5–15)
AST: 25 U/L (ref 15–41)
Alkaline Phosphatase: 82 U/L (ref 38–126)
BUN: 12 mg/dL (ref 6–20)
CHLORIDE: 108 mmol/L (ref 101–111)
CO2: 20 mmol/L — ABNORMAL LOW (ref 22–32)
Calcium: 9 mg/dL (ref 8.9–10.3)
Creatinine, Ser: 0.48 mg/dL (ref 0.44–1.00)
GFR calc Af Amer: >60 mL/min (ref >60)
GFR calc non Af Amer: >60 mL/min (ref >60)
GLUCOSE: 85 mg/dL (ref 65–99)
POTASSIUM: 4.1 mmol/L (ref 3.5–5.1)
Sodium: 137 mmol/L (ref 135–145)
TOTAL PROTEIN: 7 g/dL (ref 6.5–8.1)
Total Bilirubin: 0.5 mg/dL (ref 0.3–1.2)

## 2015-06-14 LAB — LIPASE, BLOOD: LIPASE: 19 U/L (ref 11–51)

## 2015-06-14 MED ORDER — ONDANSETRON 4 MG PO TBDP: 4.0000 mg | ORAL_TABLET | Freq: Three times a day (TID) | ORAL | Status: DC | PRN

## 2015-06-14 MED ORDER — ACETAMINOPHEN 325 MG PO TABS
650.0000 mg | ORAL_TABLET | Freq: Once | ORAL | Status: AC
  Administered 2015-06-14: 650 mg via ORAL
  Filled 2015-06-14: qty 2

## 2015-06-14 MED ORDER — DEXTROSE 5 % IV SOLN
1.0000 g | Freq: Once | INTRAVENOUS | Status: AC
  Administered 2015-06-14: 1 g via INTRAVENOUS
  Filled 2015-06-14: qty 10

## 2015-06-14 MED ORDER — ONDANSETRON HCL 4 MG/2ML IJ SOLN
4.0000 mg | Freq: Once | INTRAMUSCULAR | Status: AC
  Administered 2015-06-14: 4 mg via INTRAVENOUS
  Filled 2015-06-14: qty 2

## 2015-06-14 MED ORDER — GI COCKTAIL ~~LOC~~
30.0000 mL | Freq: Once | ORAL | Status: AC
  Administered 2015-06-14: 30 mL via ORAL
  Filled 2015-06-14 (×2): qty 30

## 2015-06-14 MED ORDER — NITROFURANTOIN MONOHYD MACRO 100 MG PO CAPS: 100.0000 mg | ORAL_CAPSULE | Freq: Two times a day (BID) | ORAL | Status: DC

## 2015-06-14 MED ORDER — PANTOPRAZOLE SODIUM 20 MG PO TBEC: 20.0000 mg | DELAYED_RELEASE_TABLET | Freq: Two times a day (BID) | ORAL | Status: DC

## 2015-06-14 MED ORDER — SODIUM CHLORIDE 0.9 % IV SOLN
Freq: Once | INTRAVENOUS | Status: AC
  Administered 2015-06-14: 14:00:00 via INTRAVENOUS

## 2015-06-14 MED ORDER — DIPHENHYDRAMINE HCL 50 MG/ML IJ SOLN
25.0000 mg | Freq: Once | INTRAMUSCULAR | Status: AC
  Administered 2015-06-14: 25 mg via INTRAVENOUS
  Filled 2015-06-14: qty 1

## 2015-06-14 NOTE — ED Provider Notes (Signed)
[facility] Emergency Department Provider Note     Time seen: ----------------------------------------- 1:46 PM on 06/14/2015 -----------------------------------------    I have reviewed the triage vital signs and the nursing notes.   HISTORY  Chief Complaint Abdominal Pain    HPI Shelley Graham is a 20 y.o. female who presents to ER with diffuse abdominal pain and vomiting. Patient states she is [redacted] weeks pregnant, denies fevers, chills, chest pain, shortness of breath, vaginal bleeding or discharge. Patient is had an ultrasound last week which showed she was over 19 weeks.   Past Medical History  Diagnosis Date  . History of migraine headaches   . Hx of chlamydia infection   . Anxiety   . Depression   . UTI (lower urinary tract infection)     RESOLVED    Patient Active Problem List   Diagnosis Date Noted  . Gallstones and inflammation of gallbladder without obstruction 05/25/2015  . Marijuana use, episodic 05/25/2015  . GBS bacteriuria 03/14/2015  . 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 Maternal varicella, non-immune 03/10/2015  . Rubella non-immune status, antepartum 03/10/2015  . History of migraine headaches   . Anxiety   . Depression     Past Surgical History  Procedure Laterality Date  . Other surgical history      tubes ears 18 months    Allergies Review of patient's allergies indicates no known allergies.  Social History Social History  Substance Use Topics  . Smoking status: Former Smoker -- 0.25 packs/day for 6 years    Types: Cigarettes    Quit date: 03/15/2015  . Smokeless tobacco: Never Used  . Alcohol Use: No    Review of Systems Constitutional: Negative for fever. Eyes: Negative for visual changes. ENT: Negative for sore throat. Cardiovascular: Negative for chest pain. Respiratory: Negative for shortness of breath. Gastrointestinal: Positive for abdominal pain and vomiting Genitourinary: Negative for  dysuria. Musculoskeletal: Negative for back pain. Skin: Negative for rash. Neurological: Negative for headaches, focal weakness or numbness.  10-point ROS otherwise negative.  ____________________________________________   PHYSICAL EXAM:  VITAL SIGNS: ED Triage Vitals  Enc Vitals Group     BP 06/14/15 1022 136/79 mmHg     Pulse Rate 06/14/15 1022 92     Resp 06/14/15 1022 18     Temp 06/14/15 1022 97.9 F (36.6 C)     Temp Source 06/14/15 1022 Oral     SpO2 06/14/15 1022 98 %     Weight 06/14/15 1022 200 lb (90.719 kg)     Height 06/14/15 1022  (1.727 m)     Head Cir --      Peak Flow --      Pain Score 06/14/15 1022 9     Pain Loc --      Pain Edu? --      Excl. in GC? --     Constitutional: Alert and oriented. Well appearing and in no distress. Eyes: Conjunctivae are normal. PERRL. Normal extraocular movements. ENT   Head: Normocephalic and atraumatic.   Nose: No congestion/rhinnorhea.   Mouth/Throat: Mucous membranes are moist.   Neck: No stridor. Cardiovascular: Normal rate, regular rhythm. Normal and symmetric distal pulses are present in all extremities. No murmurs, rubs, or gallops. Respiratory: Normal respiratory effort without tachypnea nor retractions. Breath sounds are clear and equal bilaterally. No wheezes/rales/rhonchi. Gastrointestinal: Positive for lower abdominal tenderness, no upper abdominal tenderness. No rebound or guarding. Normal bowel sounds. Gravid uterus. Musculoskeletal: Nontender with normal range of motion  in all extremities. No joint effusions.  No lower extremity tenderness nor edema. Neurologic:  Normal speech and language. No gross focal neurologic deficits are appreciated. Speech is normal. No gait instability. Skin:  Skin is warm, dry and intact. No rash noted. ____________________________________________  ED COURSE:  Pertinent labs & imaging results that were available during my care of the patient were reviewed by  me and considered in my medical decision making (see chart for details). Patient with abdominal pain and by ultrasound is exactly [redacted] weeks pregnant. She'll be given fluids as well as IV Zofran and Rocephin for her urine. She is stable to go to labor and delivery to monitor for contractions. ____________________________________________    LABS (pertinent positives/negatives)  Labs Reviewed  COMPREHENSIVE METABOLIC PANEL - Abnormal; Notable for the following:    CO2 20 (*)    Albumin 3.3 (*)    All other components within normal limits  CBC - Abnormal; Notable for the following:    WBC 16.5 (*)    All other components within normal limits  URINALYSIS COMPLETEWITH MICROSCOPIC ([facility] ONLY) - Abnormal; Notable for the following:    Color, Urine YELLOW (*)    APPearance HAZY (*)    Leukocytes, UA 3+ (*)    Bacteria, UA MANY (*)    Squamous Epithelial / LPF 6-30 (*)    All other components within normal limits  LIPASE, BLOOD  ____________________________________________  FINAL ASSESSMENT AND PLAN  Nausea and vomiting, cystitis  Plan: Patient with labs and imaging as dictated above. Patient received a liter saline as well as IV Rocephin and Zofran. She is stable for monitoring by OB.   Emily Filbert, MD   Emily Filbert, MD 06/14/15 [id number]

## 2015-06-14 NOTE — Final Progress Note (Signed)
L&D OB Triage Note  HPI:  Shelley Graham is a 20 y.o. G1P0 female at [redacted]w[redacted]d. Estimated Date of Delivery: 11/02/15 who presented from the Emergency Room for upper abdominal pain, nausea/vomiting, and pelvic cramping. Patient also with h/o gallstones and acid reflux. Was diagnosed in the Emergency Room with a UTI and was initiated on 1 dose of IV Rocephin, however was noted to have an allergic reaction (treated with Benadryl).  Patient denies LOF, contractions, vaginal bleeding or discharge, and notes fetal movement.    OB History  Gravida Para Term Preterm AB SAB TAB Ectopic Multiple Living  1             # Outcome Date GA Lbr Len/2nd Weight Sex Delivery Anes PTL Lv  1 Current               Past Medical History  Diagnosis Date  . History of migraine headaches   . Hx of chlamydia infection   . Anxiety   . Depression   . UTI (lower urinary tract infection)     RESOLVED   Past Surgical History  Procedure Laterality Date  . Other surgical history      tubes ears 18 months    Social History  Substance Use Topics  . Smoking status: Former Smoker -- 0.25 packs/day for 6 years    Types: Cigarettes    Quit date: 03/15/2015  . Smokeless tobacco: Never Used  . Alcohol Use: No   No current facility-administered medications on file prior to encounter.   Current Outpatient Prescriptions on File Prior to Encounter  Medication Sig Dispense Refill  . busPIRone (BUSPAR) 5 MG tablet Take 1 tablet (5 mg total) by mouth 3 (three) times daily. 90 tablet 3  . Cetirizine HCl 10 MG CAPS Take 1 capsule (10 mg total) by mouth daily. (Patient not taking: Reported on 05/25/2015) 30 capsule 3  . docusate sodium (COLACE) 100 MG capsule Take 1 tablet once or twice daily as needed for constipation while taking narcotic pain medicine 30 capsule 0  . sertraline (ZOLOFT) 100 MG tablet Take 0.5 tablets (50 mg total) by mouth daily. 60 tablet 4    Allergies  Allergen Reactions  . Ceftriaxone Sodium In  Dextrose Rash    Reaction occurred immediately after starting IV Ceftriaxone and medication was stopped directly after symptoms began.       ROS:  Review of Systems - Negative except  what is noted in HPI.   Physical Exam:  Blood pressure 110/68, pulse 108, temperature 98.5 F (36.9 C), temperature source Oral, resp. rate 18, height  (1.727 m), weight 200 lb (90.719 kg), last menstrual period 01/26/2015, SpO2 98 %. General appearance: alert and no distress Abdomen: normal findings: no masses palpable and soft and gravid and abnormal findings:  mild tenderness in the epigastrium and in the lower abdomen Pelvic: deferred.   Extremities: extremities normal, atraumatic, no cyanosis or edema   FETAL EXAM: Mode: Doppler Baseline Rate (A): 158 bpm           Contraction Frequency (min): none    Labs:  Results for orders placed or performed during the hospital encounter of 06/14/15  Lipase, blood  Result Value Ref Range   Lipase 19 11 - 51 U/L  Comprehensive metabolic panel  Result Value Ref Range   Sodium 137 135 - 145 mmol/L   Potassium 4.1 3.5 - 5.1 mmol/L   Chloride 108 101 - 111 mmol/L   CO2 20 (L)  22 - 32 mmol/L   Glucose, Bld 85 65 - 99 mg/dL   BUN 12 6 - 20 mg/dL   Creatinine, Ser 4.09 0.44 - 1.00 mg/dL   Calcium 9.0 8.9 - 81.1 mg/dL   Total Protein 7.0 6.5 - 8.1 g/dL   Albumin 3.3 (L) 3.5 - 5.0 g/dL   AST 25 15 - 41 U/L   ALT 25 14 - 54 U/L   Alkaline Phosphatase 82 38 - 126 U/L   Total Bilirubin 0.5 0.3 - 1.2 mg/dL   GFR calc non Af Amer >60 >60 mL/min   GFR calc Af Amer >60 >60 mL/min   Anion gap 9 5 - 15  CBC  Result Value Ref Range   WBC 16.5 (H) 3.6 - 11.0 K/uL   RBC 4.29 3.80 - 5.20 MIL/uL   Hemoglobin 13.0 12.0 - 16.0 g/dL   HCT 91.4 78.2 - 95.6 %   MCV 89.0 80.0 - 100.0 fL   MCH 30.3 26.0 - 34.0 pg   MCHC 34.0 32.0 - 36.0 g/dL   RDW 21.3 08.6 - 57.8 %   Platelets 248 150 - 440 K/uL  Urinalysis complete, with microscopic ([facility] only)   Result Value Ref Range   Color, Urine YELLOW (A) YELLOW   APPearance HAZY (A) CLEAR   Glucose, UA NEGATIVE NEGATIVE mg/dL   Bilirubin Urine NEGATIVE NEGATIVE   Ketones, ur NEGATIVE NEGATIVE mg/dL   Specific Gravity, Urine 1.023 1.005 - 1.030   Hgb urine dipstick NEGATIVE NEGATIVE   pH 7.0 5.0 - 8.0   Protein, ur NEGATIVE NEGATIVE mg/dL   Nitrite NEGATIVE NEGATIVE   Leukocytes, UA 3+ (A) NEGATIVE   RBC / HPF 0-5 0 - 5 RBC/hpf   WBC, UA TOO NUMEROUS TO COUNT 0 - 5 WBC/hpf   Bacteria, UA MANY (A) NONE SEEN   Squamous Epithelial / LPF 6-30 (A) NONE SEEN   Mucous PRESENT     Assessment:  20 y.o. G1P0 at [redacted]w[redacted]d with:  1. Epigastric pain 2. H/o gallstones and acid reflux 3. Suspected UTI  Plan:  1. Patient with h/o gallstones.  Patient afebrile, no RUQ pain, labs wnl, no evidence of cholecystitis.  2. Patient notes that Zantac was initially helping pain at home, however is no longer working.  Will prescribe Protonix.  Given a GI cocktail while in triage with significant improvement in symptoms.  Patient was able to tolerate full PO challenge.  3. Suspected UTI - patient given Rocephin in ER with allergic reaction noted.  Documented in allergies.  Given prescription in ER for Macrobid.  4. Patient to f/u with OB/GYN as previously scheduled.  To f/u sooner if symptoms return/worsen.    Hildred Laser, MD [facility]

## 2015-06-14 NOTE — OB Triage Note (Signed)
Patient arrived from ER complaining of epigastric pain.

## 2015-06-14 NOTE — Telephone Encounter (Signed)
Pt has been in ER all day... Poss gallstones... She is being admitted to 2nd floor right now  2:16 pm//  Her mom called and just wanted you to know

## 2015-06-14 NOTE — Discharge Instructions (Signed)
Nausea and Vomiting °Nausea is a sick feeling that often comes before throwing up (vomiting). Vomiting is a reflex where stomach contents come out of your mouth. Vomiting can cause severe loss of body fluids (dehydration). Children and elderly adults can become dehydrated quickly, especially if they also have diarrhea. Nausea and vomiting are symptoms of a condition or disease. It is important to find the cause of your symptoms. °CAUSES  °· Direct irritation of the stomach lining. This irritation can result from increased acid production (gastroesophageal reflux disease), infection, food poisoning, taking certain medicines (such as nonsteroidal anti-inflammatory drugs), alcohol use, or tobacco use. °· Signals from the brain. These signals could be caused by a headache, heat exposure, an inner ear disturbance, increased pressure in the brain from injury, infection, a tumor, or a concussion, pain, emotional stimulus, or metabolic problems. °· An obstruction in the gastrointestinal tract (bowel obstruction). °· Illnesses such as diabetes, hepatitis, gallbladder problems, appendicitis, kidney problems, cancer, sepsis, atypical symptoms of a heart attack, or eating disorders. °· Medical treatments such as chemotherapy and radiation. °· Receiving medicine that makes you sleep (general anesthetic) during surgery. °DIAGNOSIS °Your caregiver may ask for tests to be done if the problems do not improve after a few days. Tests may also be done if symptoms are severe or if the reason for the nausea and vomiting is not clear. Tests may include: °· Urine tests. °· Blood tests. °· Stool tests. °· Cultures (to look for evidence of infection). °· X-rays or other imaging studies. °Test results can help your caregiver make decisions about treatment or the need for additional tests. °TREATMENT °You need to stay well hydrated. Drink frequently but in small amounts. You may wish to drink water, sports drinks, clear broth, or eat frozen  ice pops or gelatin dessert to help stay hydrated. When you eat, eating slowly may help prevent nausea. There are also some antinausea medicines that may help prevent nausea. °HOME CARE INSTRUCTIONS  °· Take all medicine as directed by your caregiver. °· If you do not have an appetite, do not force yourself to eat. However, you must continue to drink fluids. °· If you have an appetite, eat a normal diet unless your caregiver tells you differently. °¨ Eat a variety of complex carbohydrates (rice, wheat, potatoes, bread), lean meats, yogurt, fruits, and vegetables. °¨ Avoid high-fat foods because they are more difficult to digest. °· Drink enough water and fluids to keep your urine clear or pale yellow. °· If you are dehydrated, ask your caregiver for specific rehydration instructions. Signs of dehydration may include: °¨ Severe thirst. °¨ Dry lips and mouth. °¨ Dizziness. °¨ Dark urine. °¨ Decreasing urine frequency and amount. °¨ Confusion. °¨ Rapid breathing or pulse. °SEEK IMMEDIATE MEDICAL CARE IF:  °· You have blood or brown flecks (like coffee grounds) in your vomit. °· You have black or bloody stools. °· You have a severe headache or stiff neck. °· You are confused. °· You have severe abdominal pain. °· You have chest pain or trouble breathing. °· You do not urinate at least once every 8 hours. °· You develop cold or clammy skin. °· You continue to vomit for longer than 24 to 48 hours. °· You have a fever. °MAKE SURE YOU:  °· Understand these instructions. °· Will watch your condition. °· Will get help right away if you are not doing well or get worse. °  °This information is not intended to replace advice given to you by your health care provider. Make sure   you discuss any questions you have with your health care provider. °  °Document Released: 04/15/2005 Document Revised: 07/08/2011 Document Reviewed: 09/12/2010 °Elsevier Interactive Patient Education ©2016 [employer] °Pregnancy and Urinary Tract  Infection °A urinary tract infection (UTI) is a bacterial infection of the urinary tract. Infection of the urinary tract can include the ureters, kidneys (pyelonephritis), bladder (cystitis), and urethra (urethritis). All pregnant women should be screened for bacteria in the urinary tract. Identifying and treating a UTI will decrease the risk of preterm labor and developing more serious infections in both the mother and baby. °CAUSES °Bacteria germs cause almost all UTIs.  °RISK FACTORS °Many factors can increase your chances of getting a UTI during pregnancy. These include: °· Having a short urethra. °· Poor toilet and hygiene habits. °· Sexual intercourse. °· Blockage of urine along the urinary tract. °· Problems with the pelvic muscles or nerves. °· Diabetes. °· Obesity. °· Bladder problems after having several children. °· Previous history of UTI. °SIGNS AND SYMPTOMS  °· Pain, burning, or a stinging feeling when urinating. °· Suddenly feeling the need to urinate right away (urgency). °· Loss of bladder control (urinary incontinence). °· Frequent urination, more than is common with pregnancy. °· Lower abdominal or back discomfort. °· Cloudy urine. °· Blood in the urine (hematuria). °· Fever.  °When the kidneys are infected, the symptoms may be: °· Back pain. °· Flank pain on the right side more so than the left. °· Fever. °· Chills. °· Nausea. °· Vomiting. °DIAGNOSIS  °A urinary tract infection is usually diagnosed through urine tests. Additional tests and procedures are sometimes done. These may include: °· Ultrasound exam of the kidneys, ureters, bladder, and urethra. °· Looking in the bladder with a lighted tube (cystoscopy). °TREATMENT °Typically, UTIs can be treated with antibiotic medicines.  °HOME CARE INSTRUCTIONS  °· Only take over-the-counter or prescription medicines as directed by your health care provider. If you were prescribed antibiotics, take them as directed. Finish them even if you start to  feel better. °· Drink enough fluids to keep your urine clear or pale yellow. °· Do not have sexual intercourse until the infection is gone and your health care provider says it is okay. °· Make sure you are tested for UTIs throughout your pregnancy. These infections often come back.  °Preventing a UTI in the Future °· Practice good toilet habits. Always wipe from front to back. Use the tissue only once. °· Do not hold your urine. Empty your bladder as soon as possible when the urge comes. °· Do not douche or use deodorant sprays. °· Wash with soap and warm water around the genital area and the anus. °· Empty your bladder before and after sexual intercourse. °· Wear underwear with a cotton crotch. °· Avoid caffeine and carbonated drinks. They can irritate the bladder. °· Drink cranberry juice or take cranberry pills. This may decrease the risk of getting a UTI. °· Do not drink alcohol. °· Keep all your appointments and tests as scheduled.  °SEEK MEDICAL CARE IF:  °· Your symptoms get worse. °· You are still having fevers 2 or more days after treatment begins. °· You have a rash. °· You feel that you are having problems with medicines prescribed. °· You have abnormal vaginal discharge. °SEEK IMMEDIATE MEDICAL CARE IF:  °· You have back or flank pain. °· You have chills. °· You have blood in your urine. °· You have nausea and vomiting. °· You have contractions of your uterus. °· You have a gush   of fluid from the vagina. °MAKE SURE YOU: °· Understand these instructions.   °· Will watch your condition.   °· Will get help right away if you are not doing well or get worse.   °  °This information is not intended to replace advice given to you by your health care provider. Make sure you discuss any questions you have with your health care provider. °  °Document Released: 08/10/2010 Document Revised: 02/03/2013 Document Reviewed: 11/12/2012 °Elsevier Interactive Patient Education ©2016 [employer] ° °

## 2015-06-14 NOTE — ED Notes (Signed)
States she needs her gallbladder taken out but was told because she is 20 weeks pregnet they want to wait take it out, pt arrives with increased upper abd cramping and stabbing pains, denies any vomiting

## 2015-06-14 NOTE — ED Notes (Signed)
Pt had sudden itching on R arm, bilateral chest and neck with redness at these areas directly after starting the infusion of ceftriaxone IV.  Medication infusion stopped immediately and MD notified.  Allergy noted on patient's chart. After medication stopped, symptoms also stopped.

## 2015-06-14 NOTE — ED Notes (Signed)
RN called L&D and Morrie Sheldon, RN reported taht pt could be transported to L&D observation 4. Registration made aware.

## 2015-06-14 NOTE — ED Notes (Signed)
Patient in sub-wait area, threw up large amount of undigested food.

## 2015-06-22 ENCOUNTER — Encounter: Payer: BLUE CROSS/BLUE SHIELD | Admitting: Obstetrics and Gynecology

## 2015-07-05 ENCOUNTER — Ambulatory Visit (INDEPENDENT_AMBULATORY_CARE_PROVIDER_SITE_OTHER): Payer: BLUE CROSS/BLUE SHIELD | Admitting: Obstetrics and Gynecology

## 2015-07-05 ENCOUNTER — Encounter: Payer: BLUE CROSS/BLUE SHIELD | Admitting: Obstetrics and Gynecology

## 2015-07-05 ENCOUNTER — Encounter: Payer: Self-pay | Admitting: Obstetrics and Gynecology

## 2015-07-05 VITALS — BP 116/75 | HR 96 | Wt 220.1 lb

## 2015-07-05 DIAGNOSIS — Z331 Pregnant state, incidental: Secondary | ICD-10-CM

## 2015-07-05 LAB — POCT URINALYSIS DIPSTICK
BILIRUBIN UA: NEGATIVE
GLUCOSE UA: NEGATIVE
Ketones, UA: NEGATIVE
Leukocytes, UA: NEGATIVE
NITRITE UA: NEGATIVE
PH UA: 6.5
SPEC GRAV UA: 1.01
UROBILINOGEN UA: 0.2

## 2015-07-05 MED ORDER — OXYCODONE-ACETAMINOPHEN 5-325 MG PO TABS: 1.0000 | ORAL_TABLET | Freq: Four times a day (QID) | ORAL | Status: DC | PRN

## 2015-07-05 MED ORDER — PANTOPRAZOLE SODIUM 40 MG PO TBEC: 40.0000 mg | DELAYED_RELEASE_TABLET | Freq: Two times a day (BID) | ORAL | Status: DC
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
## 2015-07-05 NOTE — Progress Notes (Signed)
ROB- pt is c/o acid reflux, and still c/o about her gallbladder

## 2015-08-03 ENCOUNTER — Encounter: Payer: Self-pay | Admitting: Obstetrics and Gynecology

## 2015-08-03 ENCOUNTER — Ambulatory Visit (INDEPENDENT_AMBULATORY_CARE_PROVIDER_SITE_OTHER): Payer: Medicaid Other | Admitting: Obstetrics and Gynecology

## 2015-08-03 VITALS — BP 115/84 | HR 87 | Wt 227.4 lb

## 2015-08-03 DIAGNOSIS — Z331 Pregnant state, incidental: Secondary | ICD-10-CM | POA: Diagnosis not present

## 2015-08-03 DIAGNOSIS — Z23 Encounter for immunization: Secondary | ICD-10-CM | POA: Diagnosis not present

## 2015-08-03 LAB — POCT URINALYSIS DIPSTICK
Bilirubin, UA: NEGATIVE
Glucose, UA: NEGATIVE
Ketones, UA: NEGATIVE
NITRITE UA: NEGATIVE
PROTEIN UA: NEGATIVE
RBC UA: NEGATIVE
SPEC GRAV UA: 1.01
Urobilinogen, UA: 0.2
pH, UA: 6

## 2015-08-03 MED ORDER — ONDANSETRON 4 MG PO TBDP: 4.0000 mg | ORAL_TABLET | Freq: Three times a day (TID) | ORAL | Status: DC | PRN

## 2015-08-03 MED ORDER — TETANUS-DIPHTH-ACELL PERTUSSIS 5-2.5-18.5 LF-MCG/0.5 IM SUSP
0.5000 mL | Freq: Once | INTRAMUSCULAR | Status: AC
  Administered 2015-08-03: 0.5 mL via INTRAMUSCULAR

## 2015-08-03 MED ORDER — OXYCODONE-ACETAMINOPHEN 5-325 MG PO TABS: 1.0000 | ORAL_TABLET | Freq: Four times a day (QID) | ORAL | Status: DC | PRN

## 2015-08-03 NOTE — Progress Notes (Signed)
ROB-too late in day for glucola- will return next week, Tdap given, and already signed up for CBC, discussed cord blood donation, refilled pain med

## 2015-08-03 NOTE — Progress Notes (Signed)
ROB- blood consent signed, tdap given, still c/o about gallbladder pains

## 2015-08-03 NOTE — Patient Instructions (Signed)

## 2015-08-04 LAB — PAIN MGT SCRN (14 DRUGS), UR
Amphetamine Screen, Ur: NEGATIVE ng/mL
BARBITURATE SCRN UR: NEGATIVE ng/mL
BENZODIAZEPINE SCREEN, URINE: NEGATIVE ng/mL
BUPRENORPHINE, URINE: NEGATIVE ng/mL
CREATININE(CRT), U: 39.2 mg/dL (ref 20.0–300.0)
Cannabinoids Ur Ql Scn: NEGATIVE ng/mL
Cocaine(Metab.)Screen, Urine: NEGATIVE ng/mL
Fentanyl, Urine: NEGATIVE pg/mL
METHADONE SCREEN, URINE: NEGATIVE ng/mL
Meperidine Screen, Urine: NEGATIVE ng/mL
Opiate Scrn, Ur: NEGATIVE ng/mL
Oxycodone+Oxymorphone Ur Ql Scn: NEGATIVE ng/mL
PCP SCRN UR: NEGATIVE ng/mL
PH UR, DRUG SCRN: 6.4 (ref 4.5–8.9)
Propoxyphene, Screen: NEGATIVE ng/mL
Tramadol Ur Ql Scn: NEGATIVE ng/mL

## 2015-08-07 ENCOUNTER — Other Ambulatory Visit: Payer: Self-pay | Admitting: Obstetrics and Gynecology

## 2015-08-07 ENCOUNTER — Other Ambulatory Visit: Payer: Medicaid Other

## 2015-08-08 LAB — CBC WITH DIFFERENTIAL/PLATELET
BASOS ABS: 0 10*3/uL (ref 0.0–0.2)
Basos: 0 %
EOS (ABSOLUTE): 0.2 10*3/uL (ref 0.0–0.4)
EOS: 2 %
HEMOGLOBIN: 11.6 g/dL (ref 11.1–15.9)
Hematocrit: 34.5 % (ref 34.0–46.6)
Immature Grans (Abs): 0.1 10*3/uL (ref 0.0–0.1)
Immature Granulocytes: 1 %
LYMPHS ABS: 2.1 10*3/uL (ref 0.7–3.1)
Lymphs: 15 %
MCH: 30 pg (ref 26.6–33.0)
MCHC: 33.6 g/dL (ref 31.5–35.7)
MCV: 89 fL (ref 79–97)
MONOCYTES: 8 %
Monocytes Absolute: 1 10*3/uL — ABNORMAL HIGH (ref 0.1–0.9)
NEUTROS ABS: 10 10*3/uL — AB (ref 1.4–7.0)
Neutrophils: 74 %
PLATELETS: 285 10*3/uL (ref 150–379)
RBC: 3.87 x10E6/uL (ref 3.77–5.28)
RDW: 13.5 % (ref 12.3–15.4)
WBC: 13.5 10*3/uL — AB (ref 3.4–10.8)

## 2015-08-08 LAB — GESTATIONAL GLUCOSE TOLERANCE: GLUCOSE 1 HOUR GTT: 130 mg/dL (ref 65–179)

## 2015-08-09 ENCOUNTER — Telehealth: Payer: Self-pay | Admitting: *Deleted

## 2015-08-09 NOTE — Telephone Encounter (Signed)
-----   Message from Purcell NailsMelody N Shambley, [place] sent at 08/09/2015  9:24 AM EDT ----- Please let her know she passed glucose test  And no signs of anemia

## 2015-08-09 NOTE — Telephone Encounter (Signed)
Notified pt she voiced understanding 

## 2015-08-25 ENCOUNTER — Ambulatory Visit (INDEPENDENT_AMBULATORY_CARE_PROVIDER_SITE_OTHER): Payer: Medicaid Other | Admitting: Obstetrics and Gynecology

## 2015-08-25 ENCOUNTER — Encounter: Payer: Self-pay | Admitting: Obstetrics and Gynecology

## 2015-08-25 VITALS — BP 118/78 | HR 98 | Wt 237.4 lb

## 2015-08-25 DIAGNOSIS — Z331 Pregnant state, incidental: Secondary | ICD-10-CM

## 2015-08-25 LAB — POCT URINALYSIS DIPSTICK
Bilirubin, UA: NEGATIVE
GLUCOSE UA: NEGATIVE
KETONES UA: NEGATIVE
Nitrite, UA: NEGATIVE
SPEC GRAV UA: 1.025
UROBILINOGEN UA: 0.2
pH, UA: 5

## 2015-08-25 NOTE — Addendum Note (Signed)
Addended by: Rosine BeatLONTZ, AMY L on: 08/25/2015 02:29 PM   Modules accepted: Orders

## 2015-08-25 NOTE — Progress Notes (Signed)
ROB-states gallbladder pain still episodic, took pain med last night, discussed previous negative UDS and proof she is not taking the medicine- requested resending urine and I agreed to send it, will not get any more meds until then

## 2015-08-25 NOTE — Progress Notes (Signed)
ROB- pt is doing well, denies any complaints 

## 2015-08-28 ENCOUNTER — Other Ambulatory Visit: Payer: Self-pay | Admitting: Obstetrics and Gynecology

## 2015-08-29 LAB — PAIN MGT SCRN (14 DRUGS), UR
AMPHETAMINE SCRN UR: NEGATIVE ng/mL
BENZODIAZEPINE SCREEN, URINE: NEGATIVE ng/mL
Barbiturate Screen, Ur: NEGATIVE ng/mL
Buprenorphine, Urine: NEGATIVE ng/mL
CANNABINOIDS UR QL SCN: NEGATIVE ng/mL
COCAINE(METAB.) SCREEN, URINE: NEGATIVE ng/mL
Creatinine(Crt), U: 244 mg/dL (ref 20.0–300.0)
Fentanyl, Urine: NEGATIVE pg/mL
Meperidine Screen, Urine: NEGATIVE ng/mL
Methadone Scn, Ur: NEGATIVE ng/mL
Opiate Scrn, Ur: NEGATIVE ng/mL
Oxycodone+Oxymorphone Ur Ql Scn: NEGATIVE ng/mL
PCP SCRN UR: NEGATIVE ng/mL
PH UR, DRUG SCRN: 5.7 (ref 4.5–8.9)
PROPOXYPHENE SCREEN: NEGATIVE ng/mL
TRAMADOL UR QL SCN: NEGATIVE ng/mL

## 2015-08-30 ENCOUNTER — Encounter: Payer: Self-pay | Admitting: *Deleted

## 2015-08-30 ENCOUNTER — Emergency Department
Admission: EM | Admit: 2015-08-30 | Discharge: 2015-08-30 | Disposition: A | Discharge status: Home / Self Care | Payer: Medicaid Other | Attending: Emergency Medicine | Admitting: Emergency Medicine

## 2015-08-30 DIAGNOSIS — Z79899 Other long term (current) drug therapy: Secondary | ICD-10-CM | POA: Insufficient documentation

## 2015-08-30 DIAGNOSIS — R1011 Right upper quadrant pain: Secondary | ICD-10-CM | POA: Diagnosis present

## 2015-08-30 DIAGNOSIS — F1310 Sedative, hypnotic or anxiolytic abuse, uncomplicated: Secondary | ICD-10-CM | POA: Insufficient documentation

## 2015-08-30 DIAGNOSIS — Z87891 Personal history of nicotine dependence: Secondary | ICD-10-CM | POA: Insufficient documentation

## 2015-08-30 DIAGNOSIS — F329 Major depressive disorder, single episode, unspecified: Secondary | ICD-10-CM | POA: Diagnosis not present

## 2015-08-30 DIAGNOSIS — O26893 Other specified pregnancy related conditions, third trimester: Secondary | ICD-10-CM | POA: Diagnosis not present

## 2015-08-30 DIAGNOSIS — F1290 Cannabis use, unspecified, uncomplicated: Secondary | ICD-10-CM | POA: Diagnosis not present

## 2015-08-30 DIAGNOSIS — Z3A33 33 weeks gestation of pregnancy: Secondary | ICD-10-CM | POA: Diagnosis not present

## 2015-08-30 DIAGNOSIS — R1013 Epigastric pain: Secondary | ICD-10-CM

## 2015-08-30 LAB — COMPREHENSIVE METABOLIC PANEL
ALBUMIN: 3.2 g/dL — AB (ref 3.5–5.0)
ALK PHOS: 123 U/L (ref 38–126)
ALT: 11 U/L — AB (ref 14–54)
AST: 14 U/L — AB (ref 15–41)
Anion gap: 12 (ref 5–15)
BILIRUBIN TOTAL: 0.6 mg/dL (ref 0.3–1.2)
BUN: 9 mg/dL (ref 6–20)
CALCIUM: 9.4 mg/dL (ref 8.9–10.3)
CO2: 18 mmol/L — AB (ref 22–32)
CREATININE: 0.42 mg/dL — AB (ref 0.44–1.00)
Chloride: 106 mmol/L (ref 101–111)
GFR calc Af Amer: >60 mL/min (ref >60)
GFR calc non Af Amer: >60 mL/min (ref >60)
GLUCOSE: 81 mg/dL (ref 65–99)
Potassium: 3.5 mmol/L (ref 3.5–5.1)
SODIUM: 136 mmol/L (ref 135–145)
Total Protein: 6.8 g/dL (ref 6.5–8.1)

## 2015-08-30 LAB — LIPASE, BLOOD: Lipase: 23 U/L (ref 11–51)

## 2015-08-30 LAB — CBC
HCT: 33.7 % — ABNORMAL LOW (ref 35.0–47.0)
HEMOGLOBIN: 11.4 g/dL — AB (ref 12.0–16.0)
MCH: 29.9 pg (ref 26.0–34.0)
MCHC: 33.9 g/dL (ref 32.0–36.0)
MCV: 88.2 fL (ref 80.0–100.0)
PLATELETS: 266 10*3/uL (ref 150–440)
RBC: 3.82 MIL/uL (ref 3.80–5.20)
RDW: 13.3 % (ref 11.5–14.5)
WBC: 13.4 10*3/uL — ABNORMAL HIGH (ref 3.6–11.0)

## 2015-08-30 NOTE — ED Notes (Signed)
MD at bedside. 

## 2015-08-30 NOTE — ED Notes (Signed)
MD at bedside giving patient update.

## 2015-08-30 NOTE — ED Provider Notes (Signed)
[facility] Emergency Department Provider Note  Time seen: 6:44 PM  I have reviewed the triage vital signs and the nursing notes.   HISTORY  Chief Complaint Abdominal Pain    HPI Shelley Graham is a 20 y.o. female 2333 weeks pregnant who presents the emergency department with upper abdominal pain. According to the patient for her pregnancy she has been having pain due to gallbladder stones as well as gastric reflux. She has been seen by her midwife who has been writing her for Percocet for this pain. States she ran out of the pain medication so she came to the emergency department for evaluation. She was told by her midwife to come to the emergency department if she had pain. Describes her pain as moderate to severe located in the epigastrium and right upper quadrant. Denies fever. States she has been nauseated, denies diarrhea. Denies dysuria. Denies vaginal bleeding or discharge.    Past Medical History  Diagnosis Date  . History of migraine headaches   . Hx of chlamydia infection   . Anxiety   . Depression   . UTI (lower urinary tract infection)     RESOLVED    Patient Active Problem List   Diagnosis Date Noted  . Abdominal pain 06/14/2015  . Gallstones and inflammation of gallbladder without obstruction 05/25/2015  . Marijuana use, episodic 05/25/2015  . GBS bacteriuria 03/14/2015  . 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 Maternal varicella, non-immune 03/10/2015  . Rubella non-immune status, antepartum 03/10/2015  . History of migraine headaches   . Anxiety   . Depression     Past Surgical History  Procedure Laterality Date  . Other surgical history      tubes ears 18 months    Current Outpatient Rx  Name  Route  Sig  Dispense  Refill  . busPIRone (BUSPAR) 5 MG tablet   Oral   Take 1 tablet (5 mg total) by mouth 3 (three) times daily. Patient not taking: Reported on 07/05/2015   90 tablet   3   . Cetirizine HCl 10 MG CAPS   Oral   Take  1 capsule (10 mg total) by mouth daily. Patient not taking: Reported on 05/25/2015   30 capsule   3   . docusate sodium (COLACE) 100 MG capsule      Take 1 tablet once or twice daily as needed for constipation while taking narcotic pain medicine Patient not taking: Reported on 07/05/2015   30 capsule   0   . nitrofurantoin, macrocrystal-monohydrate, (MACROBID) 100 MG capsule   Oral   Take 1 capsule (100 mg total) by mouth 2 (two) times daily. Patient not taking: Reported on 07/05/2015   20 capsule   0   . ondansetron (ZOFRAN ODT) 4 MG disintegrating tablet   Oral   Take 1 tablet (4 mg total) by mouth every 8 (eight) hours as needed for nausea or vomiting.   20 tablet   0   . oxyCODONE-acetaminophen (PERCOCET/ROXICET) 5-325 MG tablet   Oral   Take 1-2 tablets by mouth every 6 (six) hours as needed.   30 tablet   0   . pantoprazole (PROTONIX) 40 MG tablet   Oral   Take 1 tablet (40 mg total) by mouth 2 (two) times daily.   60 tablet   2   . Prenatal Vit-Fe Fumarate-FA (PRENATAL MULTIVITAMIN) TABS tablet   Oral   Take 1 tablet by mouth daily at 12 noon.         .Marland Kitchen  sertraline (ZOLOFT) 100 MG tablet   Oral   Take 0.5 tablets (50 mg total) by mouth daily. Patient not taking: Reported on 07/05/2015   60 tablet   4     Allergies Ceftriaxone sodium in dextrose  Family History  Problem Relation Age of Onset  . Asthma Mother     as child  . Asthma Brother   . Cancer Father   . Cancer Maternal Grandmother     colon  . Cancer Paternal Grandfather     lung  . Migraines Mother   . Thyroid disease Mother     Social History Social History  Substance Use Topics  . Smoking status: Former Smoker -- 0.25 packs/day for 6 years    Types: Cigarettes    Quit date: 03/15/2015  . Smokeless tobacco: Never Used  . Alcohol Use: No    Review of Systems Constitutional: Negative for fever. Cardiovascular: Negative for chest pain. Respiratory: Negative for shortness of  breath. Gastrointestinal: Upper abdominal pain. Genitourinary: Negative for dysuria. Musculoskeletal: Negative for back pain. Neurological: Negative for headache 10-point ROS otherwise negative.  ____________________________________________   PHYSICAL EXAM:  VITAL SIGNS: ED Triage Vitals  Enc Vitals Group     BP 08/30/15 1648 130/69 mmHg     Pulse Rate 08/30/15 1648 112     Resp 08/30/15 1648 18     Temp 08/30/15 1648 97.5 F (36.4 C)     Temp Source 08/30/15 1648 Oral     SpO2 08/30/15 1648 100 %     Weight 08/30/15 1648 216 lb (97.977 kg)     Height 08/30/15 1648  (1.727 m)     Head Cir --      Peak Flow --      Pain Score 08/30/15 1649 5     Pain Loc --      Pain Edu? --      Excl. in GC? --     Constitutional: Alert and oriented. Well appearing and in no distress. Eyes: Normal exam ENT   Head: Normocephalic and atraumatic.   Mouth/Throat: Mucous membranes are moist. Cardiovascular: Normal rate, regular rhythm. No murmur Respiratory: Normal respiratory effort without tachypnea nor retractions. Breath sounds are clear  Gastrointestinal: Moderate epigastric tenderness palpation. No rebound or guarding. No distention. No lower abdominal tenderness. Musculoskeletal: Nontender with normal range of motion in all extremities. Neurologic:  Normal speech and language. No gross focal neurologic deficits  Skin:  Skin is warm, dry and intact.  Psychiatric: Mood and affect are normal. Speech and behavior are normal.  ____________________________________________    INITIAL IMPRESSION / ASSESSMENT AND PLAN / ED COURSE  Pertinent labs & imaging results that were available during my care of the patient were reviewed by me and considered in my medical decision making (see chart for details).  Patient presents the emergency department with upper abdominal pain. Patient's labs including LFTs are largely within normal limits. I discussed with the patient the need to  obtain an ultrasound to rule out acute: Exceeds. Patient very opposed obtaining ultrasound states she has had them done in the past and they never shown anything. Patient states only reason she is here is for more pain medication. I discussed with the patient as she is [redacted] weeks pregnant and will not be writing her pain medication. I have discussed this patient with Dr. Valentino Saxon of the patient's OB/GYN practice, she states the patient has happened several recent negative urine drug screens, and they have told the patient  they will no longer be writing her for pain medication, which is possibly the reason the patient came to the emergency department tonight. When I told the patient I will not be prescribing her pain medication although I still feel it is important that we obtain an ultrasound to rule out acute gallbladder disease That would very much change the disposition, she became upset and requested to sign out AMA. She also states she will no longer be seeing [facility] and she will find a different OB/GYN.  ____________________________________________   FINAL CLINICAL IMPRESSION(S) / ED DIAGNOSES  Abdominal pain   Minna Antis, MD 08/30/15 [id number]

## 2015-08-30 NOTE — ED Notes (Signed)
Pt leaving AMA.  Signed AMA paperwork.  Pt updated via MD.

## 2015-08-30 NOTE — ED Notes (Signed)
States RUQ abd pain and acid refulx, states a known hx of gallstones and feels like this is what is causing the pain, pt is [redacted] weeks pregnant, denies any contractions or bleeding, denies any vomiting

## 2015-09-11 ENCOUNTER — Telehealth: Payer: Self-pay | Admitting: *Deleted

## 2015-09-11 NOTE — Telephone Encounter (Signed)
-----   Message from NibleyMelody N [place]y, [place] sent at 08/29/2015  2:18 PM EDT ----- Still negative, no more pain meds prescribed- will need to go in for evaluation if pain worsens.

## 2015-09-11 NOTE — Telephone Encounter (Signed)
Left detailed message, also see note 08/30/15 pt went to er for pain meds and was denied and stated she was going to transfer out

## 2015-09-13 ENCOUNTER — Encounter: Payer: Medicaid Other | Admitting: Obstetrics and Gynecology

## 2015-09-20 ENCOUNTER — Observation Stay
Admission: EM | Admit: 2015-09-20 | Discharge: 2015-09-20 | Disposition: A | Discharge status: Home / Self Care | Payer: Medicaid Other | Attending: Obstetrics and Gynecology | Admitting: Obstetrics and Gynecology

## 2015-09-20 DIAGNOSIS — Z3A34 34 weeks gestation of pregnancy: Secondary | ICD-10-CM | POA: Insufficient documentation

## 2015-09-20 DIAGNOSIS — O26853 Spotting complicating pregnancy, third trimester: Principal | ICD-10-CM | POA: Insufficient documentation

## 2015-09-20 DIAGNOSIS — N939 Abnormal uterine and vaginal bleeding, unspecified: Secondary | ICD-10-CM | POA: Diagnosis present

## 2015-09-20 MED ORDER — ACETAMINOPHEN 325 MG PO TABS: 650.0000 mg | ORAL_TABLET | ORAL | Status: DC | PRN

## 2015-09-20 NOTE — Plan of Care (Signed)
Pt presents to l/d with c/o vaginal bleeding. Started this afternoon.

## 2015-09-20 NOTE — Plan of Care (Signed)
Spoke to dr defrancesco regarding pt's complaint. Orders received.

## 2015-09-20 NOTE — Plan of Care (Signed)
Pt up to the bathroom. No further spotting/bleeding noted. Pt to call office tomorrow for follow up appointment. Pt d/c home with d/c instructions.

## 2015-09-20 NOTE — Plan of Care (Signed)
Pt asked for nurse to check her ear. Instructed that pt could go back to ER to have ear checked or call office tomorrow and see if they could work her in. Pt states she missed appointment last week and needs to call and make another appointment.

## 2015-09-20 NOTE — Plan of Care (Signed)
Pt presents to l/d with c/o vaginal bleeding that started this afternoon. Pt wore a panty liner in that had a small amount pink tinged discharge.

## 2015-09-21 NOTE — Final Progress Note (Signed)
L&D OB Triage Note  SUBJECTIVE Shelley Graham is a 20 y.o. G1P0 female at 5634w0d, EDD Estimated Date of Delivery: 11/02/15 who presented to triage with complaints of vaginal spotting. Patient noted pinkish staining on her panty liner. Fetus is active. No complaint of uterine contractions no recent cervix.    OBJECTIVE Nursing Evaluation: BP 120/67 mmHg  Pulse 121  Temp(Src) 99.2 F (37.3 C)  LMP 01/26/2015 (Approximate) no significant findings for premature labor, abruption. Patient is Rh+  NST was performed and has been reviewed by me.  NST INTERPRETATION: Indications: vaginal bleeding  Mode: External Baseline Rate (A): 149 bpm           Contraction Frequency (min): 0  ASSESSMENT Impression:  1. Pregnancy:  G1P0 at 1834w0d , EDD Estimated Date of Delivery: 11/02/15 2.  Reactive NST 3. Vaginal spotting, not ongoing; Rh+; reassurance is noted by antepartum monitoring  PLAN 1. Reassurance given 2. Discharge home with bleeding/labor precautions.  3. Continue routine prenatal care.   Herold HarmsMartin A Cherre Kothari, MD

## 2015-09-27 ENCOUNTER — Ambulatory Visit (INDEPENDENT_AMBULATORY_CARE_PROVIDER_SITE_OTHER): Payer: Medicaid Other | Admitting: Obstetrics and Gynecology

## 2015-09-27 ENCOUNTER — Encounter: Payer: Self-pay | Admitting: Obstetrics and Gynecology

## 2015-09-27 VITALS — BP 120/78 | HR 100 | Wt 243.8 lb

## 2015-09-27 DIAGNOSIS — Z3493 Encounter for supervision of normal pregnancy, unspecified, third trimester: Secondary | ICD-10-CM | POA: Diagnosis not present

## 2015-09-27 DIAGNOSIS — B373 Candidiasis of vulva and vagina: Secondary | ICD-10-CM

## 2015-09-27 DIAGNOSIS — E669 Obesity, unspecified: Secondary | ICD-10-CM

## 2015-09-27 DIAGNOSIS — H7291 Unspecified perforation of tympanic membrane, right ear: Secondary | ICD-10-CM

## 2015-09-27 DIAGNOSIS — B3731 Acute candidiasis of vulva and vagina: Secondary | ICD-10-CM

## 2015-09-27 DIAGNOSIS — A630 Anogenital (venereal) warts: Secondary | ICD-10-CM

## 2015-09-27 LAB — POCT URINALYSIS DIPSTICK
BILIRUBIN UA: NEGATIVE
Blood, UA: NEGATIVE
Glucose, UA: NEGATIVE
KETONES UA: NEGATIVE
Nitrite, UA: NEGATIVE
Spec Grav, UA: 1.015
Urobilinogen, UA: 0.2
pH, UA: 6

## 2015-09-27 MED ORDER — NEOMYCIN-POLYMYXIN-HC 3.5-10000-1 OT SOLN: 4.0000 [drp] | Freq: Three times a day (TID) | OTIC | Status: DC

## 2015-09-27 MED ORDER — TERCONAZOLE 0.4 % VA CREA: 1.0000 | TOPICAL_CREAM | Freq: Every day | VAGINAL | Status: DC

## 2015-09-27 NOTE — Progress Notes (Signed)
ROB- pt denies any new complaints, R ear pain x 1 week

## 2015-09-27 NOTE — Patient Instructions (Signed)
Genital Warts Genital warts are a common STD (sexually transmitted disease). They may appear as small bumps on the tissues of the genital area or anal area. Sometimes, they can become irritated and cause pain. Genital warts are easily passed to other people through sexual contact. Getting treatment is important because genital warts can lead to other problems. In females, the virus that causes genital warts may increase the risk of cervical cancer. CAUSES Genital warts are caused by a virus that is called human papillomavirus (HPV). HPV is spread by having unprotected sex with an infected person. It can be spread through vaginal, anal, and oral sex. Many people do not know that they are infected. They may be infected for years without problems. However, even if they do not have problems, they can pass the infection to their sexual partners. RISK FACTORS Genital warts are more likely to develop in:  People who have unprotected sex.  People who have multiple sexual partners.  People who become sexually active before they are 21 years of age.  Men who are not circumcised.  Women who have a female sexual partner who is not circumcised.  People who have a weakened body defense system (immune system) due to disease or medicine.  People who smoke. SYMPTOMS Symptoms of genital warts include:  Small growths in the genital area or anal area. These warts often grow in clusters.  Itching and irritation in the genital area or anal area.  Bleeding from the warts.  Painful sexual intercourse. DIAGNOSIS Genital warts can usually be diagnosed from their appearance on the vagina, vulva, penis, perineum, anus, or rectum. Tests may also be done, such as:  Biopsy. A tissue sample is removed so it can be looked at under a microscope.  Colposcopy. In females, a magnifying tool is used to examine the vagina and cervix. Certain solutions may be used to make the HPV cells change color so they can be seen more  easily.  A Pap test in females.  Tests for other STDs. TREATMENT Treatment for genital warts may include:  Applying prescription medicines to the warts. These may be solutions or creams.  Freezing the warts with liquid nitrogen (cryotherapy).  Burning the warts with:  Laser treatment.  An electrified probe (electrocautery).  Injecting a substance (Candida antigen or Trichophyton antigen) into the warts to help the body's immune system to fight off the warts.  Interferon injections.  Surgery to remove the warts. HOME CARE INSTRUCTIONS Medicines  Apply over-the-counter and prescription medicines only as told by your health care provider.  Do not treat genital warts with medicines that are used for treating hand warts.  Talk with your health care provider about using over-the-counter anti-itch creams. General Instructions  Do not touch or scratch the warts.  Do not have sex until your treatment has been completed.  Tell your current and past sexual partners about your condition because they may also need treatment.  Keep all follow-up visits as told by your health care provider. This is important.  After treatment, use condoms during sex to prevent future infections. Other Instructions for Women  Women who have genital warts might need increased screening for cervical cancer. This type of cancer is slow growing and can be cured if it is found early. Chances of developing cervical cancer are increased with HPV.  If you become pregnant, tell your health care provider that you have had HPV. Your health care provider will monitor you closely during pregnancy to be sure that your   baby is safe. PREVENTION Talk with your health care provider about getting the HPV vaccines. These vaccines prevent some HPV infections and cancers. It is recommended that the vaccine be given to males and females who are 9-26 years of age. It will not work if you already have HPV, and it is not  recommended for pregnant women. SEEK MEDICAL CARE IF:  You have redness, swelling, or pain in the area of the treated skin.  You have a fever.  You feel generally ill.  You feel lumps in and around your genital area or anal area.  You have bleeding in your genital area or anal area.  You have pain during sexual intercourse.   This information is not intended to replace advice given to you by your health care provider. Make sure you discuss any questions you have with your health care provider.   Document Released: 04/12/2000 Document Revised: 01/04/2015 Document Reviewed: 07/11/2014 Elsevier Interactive Patient Education 2016 [employer] Inc[employer]  

## 2015-09-27 NOTE — Progress Notes (Signed)
ROB-concerns about the following: 1.Bumps on perineum- warts noted at introitus, approx 10- info given for her to review. 2. Right ear pain after brown drainage noted 3 days ago, h/o recurrent ear infections as child- can't hear good out of it and hurts to touch, also notes a ringing sound- right tympanic bruising noted with increased ear wax- Ciprodex drops ordered and instructed on use. 3. Vaginal bleeding seen 1 week ago at hospital for- now has a lot of thick d/c- + yeast on wet prep- counseled and treated with terazol 7.  Did not mention GB pains at this visit. Plans FOB and her mother as labor support

## 2015-09-29 LAB — URINE CULTURE

## 2015-10-19 ENCOUNTER — Ambulatory Visit (INDEPENDENT_AMBULATORY_CARE_PROVIDER_SITE_OTHER): Payer: Medicaid Other | Admitting: Obstetrics and Gynecology

## 2015-10-19 ENCOUNTER — Encounter: Payer: Self-pay | Admitting: Obstetrics and Gynecology

## 2015-10-19 VITALS — BP 98/81 | HR 88 | Wt 246.0 lb

## 2015-10-19 DIAGNOSIS — Z113 Encounter for screening for infections with a predominantly sexual mode of transmission: Secondary | ICD-10-CM

## 2015-10-19 DIAGNOSIS — Z3685 Encounter for antenatal screening for Streptococcus B: Secondary | ICD-10-CM

## 2015-10-19 DIAGNOSIS — Z36 Encounter for antenatal screening of mother: Secondary | ICD-10-CM

## 2015-10-19 DIAGNOSIS — Z3493 Encounter for supervision of normal pregnancy, unspecified, third trimester: Secondary | ICD-10-CM

## 2015-10-19 LAB — POCT URINALYSIS DIPSTICK
Bilirubin, UA: NEGATIVE
GLUCOSE UA: NEGATIVE
Ketones, UA: NEGATIVE
LEUKOCYTES UA: NEGATIVE
NITRITE UA: NEGATIVE
RBC UA: NEGATIVE
Spec Grav, UA: 1.015
UROBILINOGEN UA: 0.2
pH, UA: 6

## 2015-10-19 NOTE — Patient Instructions (Signed)
Braxton Hicks Contractions °Contractions of the uterus can occur throughout pregnancy. Contractions are not always a sign that you are in labor.  °WHAT ARE BRAXTON HICKS CONTRACTIONS?  °Contractions that occur before labor are called Braxton Hicks contractions, or false labor. Toward the end of pregnancy (32-34 weeks), these contractions can develop more often and may become more forceful. This is not true labor because these contractions do not result in opening (dilatation) and thinning of the cervix. They are sometimes difficult to tell apart from true labor because these contractions can be forceful and people have different pain tolerances. You should not feel embarrassed if you go to the hospital with false labor. Sometimes, the only way to tell if you are in true labor is for your health care provider to look for changes in the cervix. °If there are no prenatal problems or other health problems associated with the pregnancy, it is completely safe to be sent home with false labor and await the onset of true labor. °HOW CAN YOU TELL THE DIFFERENCE BETWEEN TRUE AND FALSE LABOR? °False Labor °· The contractions of false labor are usually shorter and not as hard as those of true labor.   °· The contractions are usually irregular.   °· The contractions are often felt in the front of the lower abdomen and in the groin.   °· The contractions may go away when you walk around or change positions while lying down.   °· The contractions get weaker and are shorter lasting as time goes on.   °· The contractions do not usually become progressively stronger, regular, and closer together as with true labor.   °True Labor °· Contractions in true labor last 30-70 seconds, become very regular, usually become more intense, and increase in frequency.   °· The contractions do not go away with walking.   °· The discomfort is usually felt in the top of the uterus and spreads to the lower abdomen and low back.   °· True labor can be  determined by your health care provider with an exam. This will show that the cervix is dilating and getting thinner.   °WHAT TO REMEMBER °· Keep up with your usual exercises and follow other instructions given by your health care provider.   °· Take medicines as directed by your health care provider.   °· Keep your regular prenatal appointments.   °· Eat and drink lightly if you think you are going into labor.   °· If Braxton Hicks contractions are making you uncomfortable:   °¨ Change your position from lying down or resting to walking, or from walking to resting.   °¨ Sit and rest in a tub of warm water.   °¨ Drink 2-3 glasses of water. Dehydration may cause these contractions.   °¨ Do slow and deep breathing several times an hour.   °WHEN SHOULD I SEEK IMMEDIATE MEDICAL CARE? °Seek immediate medical care if: °· Your contractions become stronger, more regular, and closer together.   °· You have fluid leaking or gushing from your vagina.   °· You have a fever.   °· You pass blood-tinged mucus.   °· You have vaginal bleeding.   °· You have continuous abdominal pain.   °· You have low back pain that you never had before.   °· You feel your baby's head pushing down and causing pelvic pressure.   °· Your baby is not moving as much as it used to.   °  °This information is not intended to replace advice given to you by your health care provider. Make sure you discuss any questions you have with your health care   provider. °  °Document Released: 04/15/2005 Document Revised: 04/20/2013 Document Reviewed: 01/25/2013 °Elsevier Interactive Patient Education ©2016 Elsevier Inc. ° °

## 2015-10-19 NOTE — Progress Notes (Signed)
ROB- cultures obtained, labor precautions discussed. 

## 2015-10-19 NOTE — Progress Notes (Signed)
ROB-pt is having some pelvic pressure, some diarrhea

## 2015-10-21 LAB — STREP GP B NAA: Strep Gp B NAA: NEGATIVE

## 2015-10-21 LAB — GC/CHLAMYDIA PROBE AMP
Chlamydia trachomatis, NAA: NEGATIVE
NEISSERIA GONORRHOEAE BY PCR: NEGATIVE

## 2015-10-25 ENCOUNTER — Encounter: Payer: Self-pay | Admitting: *Deleted

## 2015-10-25 ENCOUNTER — Inpatient Hospital Stay
Admission: EM | Admit: 2015-10-25 | Discharge: 2015-10-25 | Disposition: A | Discharge status: Home / Self Care | Payer: Medicaid Other | Attending: Obstetrics and Gynecology | Admitting: Obstetrics and Gynecology

## 2015-10-25 ENCOUNTER — Encounter: Payer: Medicaid Other | Admitting: Obstetrics and Gynecology

## 2015-10-25 DIAGNOSIS — R102 Pelvic and perineal pain: Secondary | ICD-10-CM

## 2015-10-25 NOTE — Discharge Instructions (Signed)
Your pain today is likely nerve pain from your baby dropping. For relief, use heat, Tylenol, and warm showers. Call your provider if the pain remains or worsens.   Drink plenty of fluid, get plenty of rest. Follow up with Melody as scheduled.

## 2015-10-25 NOTE — OB Triage Note (Signed)
G1P0 at 4722w6d presents with c/o intermittent vaginal pressure. No bleeding or leaking of fluid. Baby is moving

## 2015-10-25 NOTE — OB Triage Provider Note (Signed)
L&D OB Triage Note  Shelley Graham is a 20 y.o. G1P0 female at 3972w6d, EDD Estimated Date of Delivery: 11/02/15 who presented to triage for complaints of vaginal pain-sudden onset at 7 am that persists, denies bleeding or decreased fetal mov't.  She was evaluated by the nurses with no significant findings/findings significant for labor or cervical changes. Vital signs stable. An NST was performed and has been reviewed by Myself. She was reassured this was not labor.  Cervix 1/50/oop per RN exam. NST INTERPRETATION: Indications: rule out uterine contractions                   Impression: reactive   Plan: NST performed was reviewed and was found to be reactive. She was discharged home with bleeding/labor precautions.  Continue routine prenatal care. Follow up with OB/GYN as previously scheduled.     Orman Matsumura Suzan NailerN Anuel Sitter, CNM

## 2015-11-01 ENCOUNTER — Ambulatory Visit (INDEPENDENT_AMBULATORY_CARE_PROVIDER_SITE_OTHER): Payer: Medicaid Other | Admitting: Obstetrics and Gynecology

## 2015-11-01 ENCOUNTER — Inpatient Hospital Stay
Admission: RE | Admit: 2015-11-01 | Discharge: 2015-11-05 | DRG: 774 | Disposition: A | Discharge status: Home / Self Care | Payer: Managed Care, Other (non HMO) | Attending: Obstetrics and Gynecology | Admitting: Obstetrics and Gynecology

## 2015-11-01 ENCOUNTER — Encounter: Payer: Self-pay | Admitting: Obstetrics and Gynecology

## 2015-11-01 ENCOUNTER — Encounter: Payer: Self-pay | Admitting: *Deleted

## 2015-11-01 VITALS — BP 148/106 | HR 98 | Wt 245.0 lb

## 2015-11-01 DIAGNOSIS — Z3401 Encounter for supervision of normal first pregnancy, first trimester: Secondary | ICD-10-CM | POA: Diagnosis not present

## 2015-11-01 DIAGNOSIS — O134 Gestational [pregnancy-induced] hypertension without significant proteinuria, complicating childbirth: Principal | ICD-10-CM | POA: Diagnosis present

## 2015-11-01 DIAGNOSIS — Z3A40 40 weeks gestation of pregnancy: Secondary | ICD-10-CM

## 2015-11-01 DIAGNOSIS — K8000 Calculus of gallbladder with acute cholecystitis without obstruction: Secondary | ICD-10-CM

## 2015-11-01 DIAGNOSIS — F1290 Cannabis use, unspecified, uncomplicated: Secondary | ICD-10-CM

## 2015-11-01 DIAGNOSIS — Z3493 Encounter for supervision of normal pregnancy, unspecified, third trimester: Secondary | ICD-10-CM

## 2015-11-01 HISTORY — DX: Gestational (pregnancy-induced) hypertension without significant proteinuria, unspecified trimester: O13.9

## 2015-11-01 HISTORY — DX: Essential (primary) hypertension: I10

## 2015-11-01 LAB — POCT URINALYSIS DIPSTICK
BILIRUBIN UA: NEGATIVE
Blood, UA: NEGATIVE
Glucose, UA: NEGATIVE
Ketones, UA: NEGATIVE
LEUKOCYTES UA: NEGATIVE
NITRITE UA: NEGATIVE
PH UA: 6.5
Spec Grav, UA: 1.02
UROBILINOGEN UA: 0.2

## 2015-11-01 LAB — CBC
HCT: 33.5 % — ABNORMAL LOW (ref 35.0–47.0)
Hemoglobin: 11.4 g/dL — ABNORMAL LOW (ref 12.0–16.0)
MCH: 27.2 pg (ref 26.0–34.0)
MCHC: 34.1 g/dL (ref 32.0–36.0)
MCV: 79.8 fL — AB (ref 80.0–100.0)
PLATELETS: 239 10*3/uL (ref 150–440)
RBC: 4.2 MIL/uL (ref 3.80–5.20)
RDW: 14.4 % (ref 11.5–14.5)
WBC: 12.2 10*3/uL — AB (ref 3.6–11.0)

## 2015-11-01 LAB — COMPREHENSIVE METABOLIC PANEL
ALT: 11 U/L — AB (ref 14–54)
AST: 20 U/L (ref 15–41)
Albumin: 3 g/dL — ABNORMAL LOW (ref 3.5–5.0)
Alkaline Phosphatase: 241 U/L — ABNORMAL HIGH (ref 38–126)
Anion gap: 11 (ref 5–15)
BUN: 14 mg/dL (ref 6–20)
CHLORIDE: 107 mmol/L (ref 101–111)
CO2: 18 mmol/L — AB (ref 22–32)
CREATININE: 0.52 mg/dL (ref 0.44–1.00)
Calcium: 9.5 mg/dL (ref 8.9–10.3)
GFR calc Af Amer: >60 mL/min (ref >60)
GFR calc non Af Amer: >60 mL/min (ref >60)
Glucose, Bld: 74 mg/dL (ref 65–99)
Potassium: 3.9 mmol/L (ref 3.5–5.1)
SODIUM: 136 mmol/L (ref 135–145)
Total Bilirubin: 1 mg/dL (ref 0.3–1.2)
Total Protein: 7.3 g/dL (ref 6.5–8.1)

## 2015-11-01 LAB — PROTEIN / CREATININE RATIO, URINE
CREATININE, URINE: 235 mg/dL
Protein Creatinine Ratio: 0.23 mg/mg{Cre} — ABNORMAL HIGH (ref 0.00–0.15)
TOTAL PROTEIN, URINE: 55 mg/dL

## 2015-11-01 LAB — CHLAMYDIA/NGC RT PCR (ARMC ONLY)
CHLAMYDIA TR: NOT DETECTED
N GONORRHOEAE: NOT DETECTED

## 2015-11-01 LAB — URINE DRUG SCREEN, QUALITATIVE (ARMC ONLY)
Amphetamines, Ur Screen: NOT DETECTED
BARBITURATES, UR SCREEN: NOT DETECTED
BENZODIAZEPINE, UR SCRN: NOT DETECTED
Cannabinoid 50 Ng, Ur ~~LOC~~: NOT DETECTED
Cocaine Metabolite,Ur ~~LOC~~: NOT DETECTED
MDMA (Ecstasy)Ur Screen: NOT DETECTED
METHADONE SCREEN, URINE: NOT DETECTED
Opiate, Ur Screen: NOT DETECTED
Phencyclidine (PCP) Ur S: NOT DETECTED
TRICYCLIC, UR SCREEN: NOT DETECTED

## 2015-11-01 LAB — URIC ACID: Uric Acid, Serum: 5.6 mg/dL (ref 2.3–6.6)

## 2015-11-01 MED ORDER — OXYCODONE-ACETAMINOPHEN 5-325 MG PO TABS: 1.0000 | ORAL_TABLET | ORAL | Status: DC | PRN

## 2015-11-01 MED ORDER — SOD CITRATE-CITRIC ACID 500-334 MG/5ML PO SOLN
30.0000 mL | ORAL | Status: DC | PRN
  Administered 2015-11-01: 30 mL via ORAL
  Filled 2015-11-01: qty 15

## 2015-11-01 MED ORDER — CLINDAMYCIN PHOSPHATE 900 MG/50ML IV SOLN
900.0000 mg | Freq: Three times a day (TID) | INTRAVENOUS | Status: DC
  Administered 2015-11-01 – 2015-11-03 (×6): 900 mg via INTRAVENOUS
  Filled 2015-11-01 (×7): qty 50

## 2015-11-01 MED ORDER — FENTANYL CITRATE (PF) 100 MCG/2ML IJ SOLN
50.0000 ug | INTRAMUSCULAR | Status: DC | PRN
  Administered 2015-11-02 (×2): 100 ug via INTRAVENOUS
  Administered 2015-11-03: 50 ug via INTRAVENOUS
  Administered 2015-11-03 (×2): 100 ug via INTRAVENOUS
  Filled 2015-11-01 (×3): qty 2

## 2015-11-01 MED ORDER — LABETALOL HCL 5 MG/ML IV SOLN: 20.0000 mg | INTRAVENOUS | Status: DC | PRN

## 2015-11-01 MED ORDER — LACTATED RINGERS IV SOLN
INTRAVENOUS | Status: DC
  Administered 2015-11-01 (×2): via INTRAVENOUS
  Administered 2015-11-02: 1000 mL via INTRAVENOUS
  Administered 2015-11-02 – 2015-11-03 (×6): via INTRAVENOUS

## 2015-11-01 MED ORDER — OXYCODONE-ACETAMINOPHEN 5-325 MG PO TABS: 2.0000 | ORAL_TABLET | ORAL | Status: DC | PRN

## 2015-11-01 MED ORDER — ZOLPIDEM TARTRATE 5 MG PO TABS
5.0000 mg | ORAL_TABLET | Freq: Every evening | ORAL | Status: DC | PRN
  Administered 2015-11-02: 5 mg via ORAL
  Filled 2015-11-01: qty 1

## 2015-11-01 MED ORDER — ONDANSETRON HCL 4 MG/2ML IJ SOLN
4.0000 mg | Freq: Four times a day (QID) | INTRAMUSCULAR | Status: DC | PRN
  Administered 2015-11-02: 4 mg via INTRAVENOUS
  Filled 2015-11-01: qty 2

## 2015-11-01 MED ORDER — FAMOTIDINE 20 MG PO TABS
20.0000 mg | ORAL_TABLET | Freq: Once | ORAL | Status: AC
  Administered 2015-11-01: 20 mg via ORAL

## 2015-11-01 MED ORDER — OXYTOCIN 40 UNITS IN LACTATED RINGERS INFUSION - SIMPLE MED
1.0000 m[IU]/min | INTRAVENOUS | Status: DC
  Administered 2015-11-01 – 2015-11-02 (×2): 1 m[IU]/min via INTRAVENOUS
  Administered 2015-11-02: 20 m[IU]/min via INTRAVENOUS
  Administered 2015-11-02: 1 m[IU]/min via INTRAVENOUS
  Filled 2015-11-01 (×2): qty 1000

## 2015-11-01 MED ORDER — ACETAMINOPHEN 325 MG PO TABS: 650.0000 mg | ORAL_TABLET | ORAL | Status: DC | PRN

## 2015-11-01 MED ORDER — HYDRALAZINE HCL 20 MG/ML IJ SOLN: 5.0000 mg | INTRAMUSCULAR | Status: DC | PRN

## 2015-11-01 MED ORDER — TERBUTALINE SULFATE 1 MG/ML IJ SOLN: 0.2500 mg | Freq: Once | INTRAMUSCULAR | Status: DC | PRN

## 2015-11-01 MED ORDER — FAMOTIDINE 20 MG PO TABS
ORAL_TABLET | ORAL | Status: AC
  Administered 2015-11-01: 20 mg via ORAL
  Filled 2015-11-01: qty 1

## 2015-11-01 MED ORDER — OXYTOCIN BOLUS FROM INFUSION: 500.0000 mL | INTRAVENOUS | Status: DC

## 2015-11-01 MED ORDER — LIDOCAINE HCL (PF) 1 % IJ SOLN
30.0000 mL | INTRAMUSCULAR | Status: DC | PRN
Start: 2015-11-01 — End: 2015-11-03

## 2015-11-01 MED ORDER — OXYTOCIN 40 UNITS IN LACTATED RINGERS INFUSION - SIMPLE MED
2.5000 [IU]/h | INTRAVENOUS | Status: DC
  Administered 2015-11-03: 2.5 [IU]/h via INTRAVENOUS
  Filled 2015-11-01: qty 1000

## 2015-11-01 MED ORDER — SODIUM CHLORIDE 0.9 % IJ SOLN
INTRAMUSCULAR | Status: AC
  Filled 2015-11-01: qty 50

## 2015-11-01 MED ORDER — LACTATED RINGERS IV SOLN
500.0000 mL | INTRAVENOUS | Status: DC | PRN
  Administered 2015-11-02: 500 mL via INTRAVENOUS
  Administered 2015-11-03: 1000 mL via INTRAVENOUS

## 2015-11-01 NOTE — Progress Notes (Signed)
ROB-discussed hypertension of pregnancy, s/s, and delivery needed in leu of that, repeat BPs with no real change.128/104, 126/98

## 2015-11-01 NOTE — Progress Notes (Signed)
Shelley Graham is a 20 y.o. G1P0 at 5756w6d by LMP admitted for induction of labor due to Hypertension.  Subjective:  Denies pain with contractions, resting well in the bed. Objective: BP 142/78 mmHg  Pulse 69  Temp(Src) 97.8 F (36.6 C) (Axillary)  Resp 18  Ht 5\' 8"  (1.727 m)  Wt 245 lb (111.131 kg)  BMI 37.26 kg/m2  LMP 01/26/2015 (Approximate)      FHT:  FHR: 160 bpm, variability: moderate,  accelerations:  Present,  decelerations:  Absent UC:   irregular, every 2-4 minutes, mild to palpation on 118mu/min pitocin SVE:   Dilation: 2 Effacement (%): 50 Station: -2 Exam by:: [employer] MutualShambley, CNM  Labs: Lab Results  Component Value Date   WBC 12.2* 11/01/2015   HGB 11.4* 11/01/2015   HCT 33.5* 11/01/2015   MCV 79.8* 11/01/2015   PLT 239 11/01/2015    Assessment / Plan: Induction of labor due to gestational hypertension,  progressing well on pitocin  Labor: Progressing on Pitocin, will continue to increase then AROM Foley bulb with 30cc saline placed in cervix Preeclampsia:  labs stable-reviewed labs with patient Fetal Wellbeing:  Category I Pain Control:  Labor support without medications I/D:  n/a Anticipated MOD:  NSVD  Shelley Graham Shelley Graham Shelley Graham, CNM 11/01/2015, 5:28 PM

## 2015-11-01 NOTE — H&P (Signed)
Shelley Graham is a 20 y.o. female presenting for IOL secondary to Gestational Hypertension. History OB History    Gravida Para Term Preterm AB TAB SAB Ectopic Multiple Living   1              Past Medical History  Diagnosis Date  . History of migraine headaches   . Hx of chlamydia infection   . Anxiety   . Depression   . UTI (lower urinary tract infection)     RESOLVED   Past Surgical History  Procedure Laterality Date  . Other surgical history      tubes ears 18 months   Family History: family history includes Asthma in her brother and mother; Cancer in her father, maternal grandmother, and paternal grandfather; Migraines in her mother; Thyroid disease in her mother. Social History:  reports that she quit smoking about 7 months ago. Her smoking use included Cigarettes. She has a 1.5 pack-year smoking history. She has never used smokeless tobacco. She reports that she uses illicit drugs (Benzodiazepines and Marijuana). She reports that she does not drink alcohol.   Prenatal Transfer Tool  Maternal Diabetes: No Genetic Screening: Normal Maternal Ultrasounds/Referrals: Normal Fetal Ultrasounds or other Referrals:  None Maternal Substance Abuse:  Yes:  Type: Marijuana, Prescription drugs Significant Maternal Medications:  None Significant Maternal Lab Results:  None Other Comments:  known cholecystasis  ROS Reports not feeling good in general yesterday; denies HA, visual changes or edema Dilation: 1.5 Effacement (%): 50 Station: -2 Blood pressure 148/106, pulse 98, weight 245 lb (111.131 kg), last menstrual period 01/26/2015. Exam Physical Exam  A&O x4  anxious HRR, Lungs clear Abdomen gravid, soft and non-tender No edema 1+ reflexes, no clonus Prenatal labs: ABO, Rh: --/--/A POS (11/01 1612) Antibody: Negative (11/10 1114) Rubella: <20.0 (11/10 1114) RPR: Non Reactive (11/10 1114)  HBsAg: Negative (11/10 1114)  HIV: Non Reactive (11/10 1114)  GBS: Negative  (06/22 1505) + in urine in early pregnancy  Assessment/Plan: Gestational hypertension-IOL GBS bacteremia-IV antibiotics Drug use in early pregnancy   Melody N Shambley, CNM 11/01/2015, 9:05 AM     c

## 2015-11-01 NOTE — Progress Notes (Signed)
ROB- pt BP is elevated, states yesterday she didn't feel good at all

## 2015-11-02 ENCOUNTER — Inpatient Hospital Stay: Payer: Managed Care, Other (non HMO) | Admitting: Certified Registered Nurse Anesthetist

## 2015-11-02 LAB — CBC
HEMATOCRIT: 31.7 % — AB (ref 35.0–47.0)
Hemoglobin: 10.9 g/dL — ABNORMAL LOW (ref 12.0–16.0)
MCH: 27.6 pg (ref 26.0–34.0)
MCHC: 34.5 g/dL (ref 32.0–36.0)
MCV: 79.8 fL — AB (ref 80.0–100.0)
PLATELETS: 231 10*3/uL (ref 150–440)
RBC: 3.97 MIL/uL (ref 3.80–5.20)
RDW: 14.2 % (ref 11.5–14.5)
WBC: 10.8 10*3/uL (ref 3.6–11.0)

## 2015-11-02 LAB — RPR: RPR Ser Ql: NONREACTIVE

## 2015-11-02 MED ORDER — SODIUM CHLORIDE FLUSH 0.9 % IV SOLN
INTRAVENOUS | Status: AC
  Filled 2015-11-02: qty 10

## 2015-11-02 MED ORDER — SODIUM CHLORIDE FLUSH 0.9 % IV SOLN
INTRAVENOUS | Status: AC
  Filled 2015-11-02: qty 20

## 2015-11-02 MED ORDER — AMMONIA AROMATIC IN INHA
RESPIRATORY_TRACT | Status: AC
  Filled 2015-11-02: qty 10

## 2015-11-02 MED ORDER — LIDOCAINE HCL (PF) 1 % IJ SOLN
INTRAMUSCULAR | Status: AC
  Filled 2015-11-02: qty 30

## 2015-11-02 MED ORDER — LIDOCAINE HCL (PF) 1 % IJ SOLN
INTRAMUSCULAR | Status: DC | PRN
  Administered 2015-11-02: 3 mL

## 2015-11-02 MED ORDER — OXYTOCIN 10 UNIT/ML IJ SOLN
INTRAMUSCULAR | Status: AC
  Filled 2015-11-02: qty 2

## 2015-11-02 MED ORDER — BUPIVACAINE HCL (PF) 0.25 % IJ SOLN
INTRAMUSCULAR | Status: DC | PRN
  Administered 2015-11-02 (×2): 4 mL via EPIDURAL

## 2015-11-02 MED ORDER — MISOPROSTOL 200 MCG PO TABS
ORAL_TABLET | ORAL | Status: AC
  Filled 2015-11-02: qty 4

## 2015-11-02 MED ORDER — LABETALOL HCL 5 MG/ML IV SOLN
INTRAVENOUS | Status: AC
  Filled 2015-11-02: qty 4

## 2015-11-02 MED ORDER — LIDOCAINE-EPINEPHRINE (PF) 1.5 %-1:200000 IJ SOLN
INTRAMUSCULAR | Status: DC | PRN
  Administered 2015-11-02: 3 mL via PERINEURAL

## 2015-11-02 MED ORDER — FAMOTIDINE 20 MG PO TABS
20.0000 mg | ORAL_TABLET | Freq: Three times a day (TID) | ORAL | Status: DC | PRN
  Administered 2015-11-02 (×2): 20 mg via ORAL
  Filled 2015-11-02: qty 1

## 2015-11-02 MED ORDER — FAMOTIDINE 20 MG PO TABS
ORAL_TABLET | ORAL | Status: AC
  Filled 2015-11-02: qty 1

## 2015-11-02 MED ORDER — FENTANYL 2.5 MCG/ML W/ROPIVACAINE 0.2% IN NS 100 ML EPIDURAL INFUSION (ARMC-ANES)
EPIDURAL | Status: AC
  Administered 2015-11-02: 10 mL/h via EPIDURAL
  Filled 2015-11-02: qty 100

## 2015-11-02 NOTE — Progress Notes (Signed)
Shelley Graham is a 20 y.o. G1P0 at 8131w0d by LMP admitted for induction of labor due to Hypertension.  Subjective: Reports pressure with contractions for the last 20 minutes, epidural in place  Objective: BP 115/84 mmHg  Pulse 95  Temp(Src) 97.4 F (36.3 C) (Oral)  Resp 18  Ht 5\' 8"  (1.727 m)  Wt 245 lb (111.131 kg)  BMI 37.26 kg/m2  LMP 01/26/2015 (Approximate)   Total I/O In: 5223.7 [I.V.:5003.9; Other:19.8; IV Piggyback:200] Out: -   FHT:  FHR: 133 bpm, variability: moderate,  accelerations:  Present,  decelerations:  Absent UC:   irregular, every 2-4 minutes on 1416mu/min pitocin, moderate to palpation; [id number] SVE:   Dilation: 4.5 Effacement (%): 70 Station: -1 Exam by:: JCM  Labs: Lab Results  Component Value Date   WBC 10.8 11/02/2015   HGB 10.9* 11/02/2015   HCT 31.7* 11/02/2015   MCV 79.8* 11/02/2015   PLT 231 11/02/2015    Assessment / Plan: IOL, progressing slowly  Labor: Progressing normally Preeclampsia:  labs stable Fetal Wellbeing:  Category I Pain Control:  Epidural I/D:  n/a Anticipated MOD:  NSVD  Shelley Graham Shelley Graham Shelley Graham, CNM 11/02/2015, 6:58 PM

## 2015-11-02 NOTE — Progress Notes (Signed)
Shelley Graham is a 20 y.o. G1P0 at 5923w0d by LMP admitted for induction of labor due to Hypertension.  Subjective: Sleeping upon entering room, denies pain when awake  Objective: BP 123/76 mmHg  Pulse 113  Temp(Src) 98.2 F (36.8 C) (Oral)  Resp 20  Ht 5\' 8"  (1.727 m)  Wt 245 lb (111.131 kg)  BMI 37.26 kg/m2  LMP 01/26/2015 (Approximate)      FHT:  FHR: 138 bpm, variability: moderate,  accelerations:  Present,  decelerations:  Absent UC:   irregular, every 6-9 minutes, mild to palpation, pitocin just restarted and is up to 293mu/min SVE:   Dilation: 3.5 Effacement (%): 70 Station: -2 Exam by:: [employer]ambley  Labs: Lab Results  Component Value Date   WBC 12.2* 11/01/2015   HGB 11.4* 11/01/2015   HCT 33.5* 11/01/2015   MCV 79.8* 11/01/2015   PLT 239 11/01/2015    Assessment / Plan: Induction of labor due to gestational hypertension,  progressing well on pitocin  Labor: Progressing on Pitocin, will continue to increase then AROM Preeclampsia:  labs stable Fetal Wellbeing:  Category I Pain Control:  Labor support without medications I/D:  n/a Anticipated MOD:  NSVD  Shelley Graham, CNM 11/02/2015, 8:01 AM

## 2015-11-02 NOTE — Anesthesia Procedure Notes (Signed)
Epidural Patient location during procedure: OB Start time: 11/02/2015 3:44 PM End time: 11/02/2015 3:54 PM  Staffing Anesthesiologist: Naomie DeanKEPHART, WILLIAM K Resident/CRNA: Ginger CarneMICHELET, Tomer Chalmers Performed by: resident/CRNA   Preanesthetic Checklist Completed: patient identified, site marked, surgical consent, pre-op evaluation, timeout performed, IV checked, risks and benefits discussed and monitors and equipment checked  Epidural Patient position: sitting Prep: Betadine Patient monitoring: heart rate, continuous pulse ox and blood pressure Approach: midline Location: L4-L5 Injection technique: LOR saline  Needle:  Needle type: Tuohy  Needle gauge: 18 G Needle length: 9 cm and 9 Needle insertion depth: 9 cm Catheter type: closed end flexible Catheter size: 20 Guage Catheter at skin depth: 12 cm Test dose: negative and 1.5% lidocaine with Epi 1:200 K  Assessment Sensory level: T10 Events: blood not aspirated, injection not painful, no injection resistance, negative IV test and no paresthesia  Additional Notes Pt. Evaluated and documentation done after procedure finished. Patient identified. Risks/Benefits/Options discussed with patient including but not limited to bleeding, infection, nerve damage, paralysis, failed block, incomplete pain control, headache, blood pressure changes, nausea, vomiting, reactions to medication both or allergic, itching and postpartum back pain. Confirmed with bedside nurse the patient's most recent platelet count. Confirmed with patient that they are not currently taking any anticoagulation, have any bleeding history or any family history of bleeding disorders. Patient expressed understanding and wished to proceed. All questions were answered. Sterile technique was used throughout the entire procedure. Please see nursing notes for vital signs. Test dose was given through epidural catheter and negative prior to continuing to dose epidural or start infusion.  Warning signs of high block given to the patient including shortness of breath, tingling/numbness in hands, complete motor block, or any concerning symptoms with instructions to call for help. Patient was given instructions on fall risk and not to get out of bed. All questions and concerns addressed with instructions to call with any issues or inadequate analgesia.   Patient tolerated the insertion well without complications.  Reason for block:procedure for pain

## 2015-11-02 NOTE — Anesthesia Preprocedure Evaluation (Signed)
Anesthesia Evaluation  Patient identified by MRN, date of birth, ID band Patient awake    Reviewed: Allergy & Precautions, H&P , NPO status , Patient's Chart, lab work & pertinent test results  Airway Mallampati: II  TM Distance: >3 FB Neck ROM: full    Dental  (+) Teeth Intact   Pulmonary former smoker,           Cardiovascular Exercise Tolerance: Good hypertension,  Rhythm:regular Rate:Normal     Neuro/Psych    GI/Hepatic Neg liver ROS, GERD  ,  Endo/Other  negative endocrine ROS  Renal/GU negative Renal ROS     Musculoskeletal   Abdominal   Peds  Hematology negative hematology ROS (+)   Anesthesia Other Findings   Reproductive/Obstetrics (+) Pregnancy                             Anesthesia Physical Anesthesia Plan  ASA: II  Anesthesia Plan: Epidural   Post-op Pain Management:    Induction:   Airway Management Planned:   Additional Equipment:   Intra-op Plan:   Post-operative Plan:   Informed Consent: I have reviewed the patients History and Physical, chart, labs and discussed the procedure including the risks, benefits and alternatives for the proposed anesthesia with the patient or authorized representative who has indicated his/her understanding and acceptance.   Dental Advisory Given  Plan Discussed with: Anesthesiologist  Anesthesia Plan Comments:         Anesthesia Quick Evaluation

## 2015-11-02 NOTE — Progress Notes (Signed)
Shelley Graham is a 20 y.o. G1P0 at 3254w0d by LMP admitted for induction of labor due to Hypertension.  Subjective: Reports mild pain withcurrent contractions  Objective: BP 110/67 mmHg  Pulse 101  Temp(Src) 97.9 F (36.6 C) (Oral)  Resp 18  Ht 5\' 8"  (1.727 m)  Wt 245 lb (111.131 kg)  BMI 37.26 kg/m2  LMP 01/26/2015 (Approximate)   Total I/O In: 3667.3 [I.V.:3517.3; IV Piggyback:150] Out: -   FHT:  FHR: 139 bpm, variability: moderate,  accelerations:  Present,  decelerations:  Absent UC:   irregular, every 4 minutes, mild to palpation on 15,mu/min pitocin SVE:   4.5/70/-1-AROM moderate clear fluid-IUPC placed Labs: Lab Results  Component Value Date   WBC 12.2* 11/01/2015   HGB 11.4* 11/01/2015   HCT 33.5* 11/01/2015   MCV 79.8* 11/01/2015   PLT 239 11/01/2015    Assessment / Plan: Induction of labor due to gestational hypertension,  progressing well on pitocin  Labor: slow progression on pitocin Preeclampsia:  labs stable Fetal Wellbeing:  Category I Pain Control:  Labor support without medications I/D:  n/a Anticipated MOD:  NSVD  [name], CNM 11/02/2015, 1:33 PM

## 2015-11-03 ENCOUNTER — Inpatient Hospital Stay: Payer: Managed Care, Other (non HMO) | Admitting: Anesthesiology

## 2015-11-03 ENCOUNTER — Encounter
Admission: RE | Disposition: A | Discharge status: Home / Self Care | Payer: Self-pay | Source: Home / Self Care | Attending: Obstetrics and Gynecology

## 2015-11-03 DIAGNOSIS — Z3401 Encounter for supervision of normal first pregnancy, first trimester: Secondary | ICD-10-CM

## 2015-11-03 HISTORY — PX: PERINEAL LACERATION REPAIR: SHX5389

## 2015-11-03 LAB — CBC
HCT: 21.3 % — ABNORMAL LOW (ref 35.0–47.0)
HEMATOCRIT: 28.8 % — AB (ref 35.0–47.0)
HEMOGLOBIN: 9.6 g/dL — AB (ref 12.0–16.0)
Hemoglobin: 7.1 g/dL — ABNORMAL LOW (ref 12.0–16.0)
MCH: 27.1 pg (ref 26.0–34.0)
MCH: 26.6 pg (ref 26.0–34.0)
MCHC: 33.3 g/dL (ref 32.0–36.0)
MCHC: 33.5 g/dL (ref 32.0–36.0)
MCV: 81.5 fL (ref 80.0–100.0)
MCV: 79.5 fL — AB (ref 80.0–100.0)
PLATELETS: 196 10*3/uL (ref 150–440)
Platelets: 241 10*3/uL (ref 150–440)
RBC: 3.53 MIL/uL — AB (ref 3.80–5.20)
RBC: 2.68 MIL/uL — AB (ref 3.80–5.20)
RDW: 14.5 % (ref 11.5–14.5)
RDW: 14.7 % — AB (ref 11.5–14.5)
WBC: 19.3 10*3/uL — ABNORMAL HIGH (ref 3.6–11.0)
WBC: 17.8 10*3/uL — AB (ref 3.6–11.0)

## 2015-11-03 LAB — PREPARE RBC (CROSSMATCH)

## 2015-11-03 LAB — HEMOGLOBIN AND HEMATOCRIT, BLOOD
HCT: 23.9 % — ABNORMAL LOW (ref 35.0–47.0)
Hemoglobin: 8.2 g/dL — ABNORMAL LOW (ref 12.0–16.0)

## 2015-11-03 SURGERY — SUTURE REPAIR, LACERATION, PERINEUM
Anesthesia: General | Site: Vagina | Wound class: Clean Contaminated

## 2015-11-03 MED ORDER — ONDANSETRON HCL 4 MG/2ML IJ SOLN: 4.0000 mg | INTRAMUSCULAR | Status: DC | PRN

## 2015-11-03 MED ORDER — OXYCODONE HCL 5 MG/5ML PO SOLN: 5.0000 mg | Freq: Once | ORAL | Status: DC | PRN

## 2015-11-03 MED ORDER — IBUPROFEN 600 MG PO TABS
600.0000 mg | ORAL_TABLET | Freq: Four times a day (QID) | ORAL | Status: DC
  Administered 2015-11-03 – 2015-11-05 (×4): 600 mg via ORAL
  Filled 2015-11-03 (×4): qty 1

## 2015-11-03 MED ORDER — MISOPROSTOL 200 MCG PO TABS
ORAL_TABLET | ORAL | Status: AC
  Filled 2015-11-03: qty 2

## 2015-11-03 MED ORDER — ACETAMINOPHEN-CODEINE #3 300-30 MG PO TABS
1.0000 | ORAL_TABLET | ORAL | Status: DC | PRN
  Administered 2015-11-03 – 2015-11-04 (×2): 1 via ORAL
  Filled 2015-11-03 (×2): qty 1

## 2015-11-03 MED ORDER — OXYTOCIN 10 UNIT/ML IJ SOLN
10.0000 [IU] | Freq: Once | INTRAMUSCULAR | Status: AC
  Administered 2015-11-03: 10 [IU] via INTRAMUSCULAR
  Filled 2015-11-03: qty 1

## 2015-11-03 MED ORDER — FERROUS SULFATE 325 (65 FE) MG PO TABS
325.0000 mg | ORAL_TABLET | Freq: Three times a day (TID) | ORAL | Status: DC
  Administered 2015-11-04 – 2015-11-05 (×5): 325 mg via ORAL
  Filled 2015-11-03 (×5): qty 1

## 2015-11-03 MED ORDER — PROPOFOL 10 MG/ML IV BOLUS
INTRAVENOUS | Status: DC | PRN
  Administered 2015-11-03: 140 mg via INTRAVENOUS

## 2015-11-03 MED ORDER — LACTATED RINGERS IV SOLN
INTRAVENOUS | Status: DC
Start: 2015-11-03 — End: 2015-11-04

## 2015-11-03 MED ORDER — CARBOPROST TROMETHAMINE 250 MCG/ML IM SOLN
INTRAMUSCULAR | Status: AC
  Administered 2015-11-03: 12:00:00
  Filled 2015-11-03: qty 1

## 2015-11-03 MED ORDER — AMMONIA AROMATIC IN INHA
RESPIRATORY_TRACT | Status: AC
  Filled 2015-11-03: qty 10

## 2015-11-03 MED ORDER — ONDANSETRON HCL 4 MG/2ML IJ SOLN
INTRAMUSCULAR | Status: DC | PRN
  Administered 2015-11-03: 4 mg via INTRAVENOUS

## 2015-11-03 MED ORDER — FENTANYL CITRATE (PF) 100 MCG/2ML IJ SOLN: 25.0000 ug | INTRAMUSCULAR | Status: DC | PRN

## 2015-11-03 MED ORDER — CARBOPROST TROMETHAMINE 250 MCG/ML IM SOLN
INTRAMUSCULAR | Status: DC
Start: 2015-11-03 — End: 2015-11-03
  Filled 2015-11-03: qty 1

## 2015-11-03 MED ORDER — ALBUMIN HUMAN 5 % IV SOLN
INTRAVENOUS | Status: DC | PRN
  Administered 2015-11-03: 13:00:00 via INTRAVENOUS

## 2015-11-03 MED ORDER — FLEET ENEMA 7-19 GM/118ML RE ENEM: 1.0000 | ENEMA | Freq: Once | RECTAL | Status: DC

## 2015-11-03 MED ORDER — ACETAMINOPHEN 325 MG PO TABS: 650.0000 mg | ORAL_TABLET | ORAL | Status: DC | PRN

## 2015-11-03 MED ORDER — CARBOPROST TROMETHAMINE 250 MCG/ML IM SOLN
250.0000 ug | INTRAMUSCULAR | Status: DC | PRN
  Filled 2015-11-03: qty 1

## 2015-11-03 MED ORDER — SODIUM CHLORIDE 0.9 % IV SOLN: Freq: Once | INTRAVENOUS | Status: DC

## 2015-11-03 MED ORDER — LACTATED RINGERS IV SOLN
INTRAVENOUS | Status: DC | PRN
  Administered 2015-11-03: 12:00:00 via INTRAVENOUS

## 2015-11-03 MED ORDER — COCONUT OIL OIL: 1.0000 "application " | TOPICAL_OIL | Status: DC | PRN

## 2015-11-03 MED ORDER — LIDOCAINE HCL (CARDIAC) 20 MG/ML IV SOLN
INTRAVENOUS | Status: DC | PRN
  Administered 2015-11-03: 100 mg via INTRAVENOUS

## 2015-11-03 MED ORDER — NEOSTIGMINE METHYLSULFATE 10 MG/10ML IV SOLN
INTRAVENOUS | Status: DC | PRN
  Administered 2015-11-03: 5 mg via INTRAVENOUS

## 2015-11-03 MED ORDER — SUCCINYLCHOLINE CHLORIDE 20 MG/ML IJ SOLN
INTRAMUSCULAR | Status: DC | PRN
  Administered 2015-11-03: 100 mg via INTRAVENOUS

## 2015-11-03 MED ORDER — PRENATAL MULTIVITAMIN CH
1.0000 | ORAL_TABLET | Freq: Every day | ORAL | Status: DC
  Administered 2015-11-03 – 2015-11-05 (×3): 1 via ORAL
  Filled 2015-11-03 (×3): qty 1

## 2015-11-03 MED ORDER — WITCH HAZEL-GLYCERIN EX PADS: 1.0000 "application " | MEDICATED_PAD | CUTANEOUS | Status: DC | PRN

## 2015-11-03 MED ORDER — OXYCODONE HCL 5 MG PO TABS: 5.0000 mg | ORAL_TABLET | Freq: Once | ORAL | Status: DC | PRN

## 2015-11-03 MED ORDER — ONDANSETRON HCL 4 MG PO TABS: 4.0000 mg | ORAL_TABLET | ORAL | Status: DC | PRN

## 2015-11-03 MED ORDER — FENTANYL 2.5 MCG/ML W/ROPIVACAINE 0.2% IN NS 100 ML EPIDURAL INFUSION (ARMC-ANES)
EPIDURAL | Status: AC
  Administered 2015-11-03: 250 ug
  Filled 2015-11-03: qty 100

## 2015-11-03 MED ORDER — MISOPROSTOL 200 MCG PO TABS
ORAL_TABLET | ORAL | Status: AC
  Filled 2015-11-03: qty 1

## 2015-11-03 MED ORDER — DIBUCAINE 1 % RE OINT: 1.0000 "application " | TOPICAL_OINTMENT | RECTAL | Status: DC | PRN

## 2015-11-03 MED ORDER — BENZOCAINE-MENTHOL 20-0.5 % EX AERO
1.0000 "application " | INHALATION_SPRAY | CUTANEOUS | Status: DC | PRN
  Filled 2015-11-03 (×2): qty 56

## 2015-11-03 MED ORDER — CARBOPROST TROMETHAMINE 250 MCG/ML IM SOLN
INTRAMUSCULAR | Status: AC
  Administered 2015-11-03: 250 ug
  Filled 2015-11-03: qty 1

## 2015-11-03 MED ORDER — AMPICILLIN SODIUM 2 G IJ SOLR
2.0000 g | Freq: Once | INTRAMUSCULAR | Status: DC
  Filled 2015-11-03: qty 2000

## 2015-11-03 MED ORDER — CLINDAMYCIN PHOSPHATE 900 MG/50ML IV SOLN
900.0000 mg | Freq: Once | INTRAVENOUS | Status: AC
  Administered 2015-11-03: 900 mg via INTRAVENOUS
  Filled 2015-11-03: qty 50

## 2015-11-03 MED ORDER — SIMETHICONE 80 MG PO CHEW: 80.0000 mg | CHEWABLE_TABLET | ORAL | Status: DC | PRN

## 2015-11-03 MED ORDER — PHENYLEPHRINE HCL 10 MG/ML IJ SOLN
INTRAMUSCULAR | Status: DC | PRN
  Administered 2015-11-03: 100 ug via INTRAVENOUS

## 2015-11-03 MED ORDER — SENNOSIDES-DOCUSATE SODIUM 8.6-50 MG PO TABS: 2.0000 | ORAL_TABLET | ORAL | Status: DC

## 2015-11-03 MED ORDER — DIPHENHYDRAMINE HCL 25 MG PO CAPS: 25.0000 mg | ORAL_CAPSULE | Freq: Once | ORAL | Status: DC

## 2015-11-03 MED ORDER — ZOLPIDEM TARTRATE 5 MG PO TABS: 5.0000 mg | ORAL_TABLET | Freq: Every evening | ORAL | Status: DC | PRN

## 2015-11-03 MED ORDER — BUTORPHANOL TARTRATE 1 MG/ML IJ SOLN
INTRAMUSCULAR | Status: AC
  Administered 2015-11-03: 2 mg
  Filled 2015-11-03: qty 2

## 2015-11-03 MED ORDER — SODIUM CHLORIDE 0.9 % IV SOLN
INTRAVENOUS | Status: DC | PRN
  Administered 2015-11-03: 12:00:00 via INTRAVENOUS

## 2015-11-03 MED ORDER — GLYCOPYRROLATE 0.2 MG/ML IJ SOLN
INTRAMUSCULAR | Status: DC | PRN
Start: 2015-11-03 — End: 2015-11-03
  Administered 2015-11-03: .8 mg via INTRAVENOUS

## 2015-11-03 MED ORDER — ACETAMINOPHEN 325 MG PO TABS: 650.0000 mg | ORAL_TABLET | Freq: Once | ORAL | Status: DC

## 2015-11-03 MED ORDER — DIPHENHYDRAMINE HCL 25 MG PO CAPS: 25.0000 mg | ORAL_CAPSULE | Freq: Four times a day (QID) | ORAL | Status: DC | PRN

## 2015-11-03 MED ORDER — ROCURONIUM BROMIDE 100 MG/10ML IV SOLN
INTRAVENOUS | Status: DC | PRN
  Administered 2015-11-03: 30 mg via INTRAVENOUS

## 2015-11-03 SURGICAL SUPPLY — 28 items
BAG URO DRAIN 2000ML W/SPOUT (MISCELLANEOUS) ×3 IMPLANT
CANISTER SUCT 1200ML W/VALVE (MISCELLANEOUS) ×3 IMPLANT
CATH FOLEY 2WAY  5CC 16FR (CATHETERS) ×2
CATH URTH 16FR FL 2W BLN LF (CATHETERS) ×1 IMPLANT
DRAPE PERI LITHO V/GYN (MISCELLANEOUS) ×3 IMPLANT
DRAPE SHEET LG 3/4 BI-LAMINATE (DRAPES) ×3 IMPLANT
DRAPE UNDER BUTTOCK W/FLU (DRAPES) ×3 IMPLANT
ELECT REM PT RETURN 9FT ADLT (ELECTROSURGICAL) ×3
ELECTRODE REM PT RTRN 9FT ADLT (ELECTROSURGICAL) ×1 IMPLANT
GAUZE PACK 2X3YD (MISCELLANEOUS) ×3 IMPLANT
GLOVE BIO SURGEON STRL SZ 6 (GLOVE) ×3 IMPLANT
GLOVE BIOGEL PI IND STRL 6.5 (GLOVE) ×1 IMPLANT
GLOVE BIOGEL PI INDICATOR 6.5 (GLOVE) ×2
GOWN STRL REUS W/ TWL LRG LVL3 (GOWN DISPOSABLE) ×2 IMPLANT
GOWN STRL REUS W/TWL LRG LVL3 (GOWN DISPOSABLE) ×4
IV LACTATED RINGERS 1000ML (IV SOLUTION) ×3 IMPLANT
KIT RM TURNOVER CYSTO AR (KITS) ×3 IMPLANT
LABEL OR SOLS (LABEL) IMPLANT
NDL SAFETY 22GX1.5 (NEEDLE) ×3 IMPLANT
NS IRRIG 500ML POUR BTL (IV SOLUTION) ×3 IMPLANT
PACK BASIN MINOR ARMC (MISCELLANEOUS) ×3 IMPLANT
PAD OB MATERNITY 4.3X12.25 (PERSONAL CARE ITEMS) ×3 IMPLANT
PAD PREP 24X41 OB/GYN DISP (PERSONAL CARE ITEMS) ×3 IMPLANT
SPONGE LAP 18X18 5 PK (GAUZE/BANDAGES/DRESSINGS) ×3 IMPLANT
SPONGE XRAY 4X4 16PLY STRL (MISCELLANEOUS) ×6 IMPLANT
SUT VIC AB 2-0 CT1 (SUTURE) ×12 IMPLANT
SUT VICRYL 3-0 27IN (SUTURE) IMPLANT
SYR CONTROL 10ML (SYRINGE) ×3 IMPLANT

## 2015-11-03 NOTE — Progress Notes (Signed)
Day #0 of Surgery Procedure(s) (LRB): SUTURE REPAIR VAGINAL LACERATION (N/A) EXAM UNDER ANESTHESIA (N/A)  Subjective: Patient reports no complaints.  Denies dizziness, SOB, chest pain.  Notes bleeding is minimal currently.     Objective: I have reviewed patient's vital signs, intake and output, medications and labs. Filed Vitals:   11/03/15 1348 11/03/15 1354 11/03/15 1419 11/03/15 1518  BP: 115/74  138/87 126/81  Pulse: 111 116 127 93  Temp: 100.9 F (38.3 C)  97.7 F (36.5 C) 97.1 F (36.2 C)  TempSrc:   Oral Axillary  Resp: 21 20 20 18   Height:      Weight:      SpO2: 100% 100% 100% 100%    General: alert and no distress Resp: clear to auscultation bilaterally Cardio: regular rate and rhythm, S1, S2 normal, no murmur, click, rub or gallop GI: soft, non-tender; bowel sounds normal; no masses,  no organomegaly Extremities: extremities normal, atraumatic, no cyanosis or edema Vaginal Bleeding: minimal blood noted on pad. Fundus firm, at level of umbilicus.    Labs:   CBC Latest Ref Rng 11/03/2015 11/03/2015 11/02/2015  WBC 3.6 - 11.0 K/uL - 19.3(H) 10.8  Hemoglobin 12.0 - 16.0 g/dL 8.2(L) 9.6(L) 10.9(L)  Hematocrit 35.0 - 47.0 % 23.9(L) 28.8(L) 31.7(L)  Platelets 150 - 440 K/uL - 241 231    Assessment: s/p Procedure(s): SUTURE REPAIR PERINEAL LACERATION (N/A) EXAM UNDER ANESTHESIA (N/A)  Postpartum hemorrhage due to uterine atony (EBL 2565) :stable and anemia Gestational HTN  Plan: - Patient doing well post-procedure, currently stable.   - S/p 1 unit PRBCs in OR.  Stat CBC 1 hr post-transfusion notes CBC at 8.2.  Will continue to check serial Hgbs q 4 hrs until stable.  Discussed with patient that she may require additional units of blood based on lab values.  Is currently having normal postpartum bleeding. Reiterated strict bleeding precautions.  Advised to have assistance with ambulation tonight. Will also treat with PO iron  TID.  - Will administer 1 dose of Cleocin  postpartum to decrease risk of infection due to uterine exploration (multiple episodes of bimanual massage, assessing for retained products).  Was also given for GBS prophylaxis during labor.  Patient has Hemabate ordered prn for future episodes of uterine atony. - Strict I/O.  To notify MD of low urine output (<30 cc/hr) -  Regular diet as tolerated -  PO medication -  Maintain IV fluids  - Continue to monitor BPs.  Currently stable.  Patient with h/o GHTN in pregnancy.    LOS: 2 days    Shelley Graham 11/03/2015, 3:38 PM

## 2015-11-03 NOTE — Progress Notes (Signed)
RN to the bedside to assist patient with final stage of laboring progress, SVE 100/10/0.  Pt.'s last BM was before start of induction.  Pt. Refuses enema, afraid of the process, RN explain procedure, pt. States, "I'm scared".  Pt. Currently in position with legs resting on strippus, RN coaching patient to open her legs, pt. States, "I'm scared".  RN coaching pt. With baby steps because she is afraid of having her baby.  FOB & pt.'s mother at the bedside. RN remains at the bedside.

## 2015-11-03 NOTE — Significant Event (Signed)
OBSTETRICS/GYNECOLOGY PROGRESS NOTE  Called by nurse to midwife delivery due to postpartum hemorrhage.  At time of arrival, patient patient had received a dose of Cytotec and Hemabate, for uterine atony, with vaginal continued bleeding.  Midwife Melody Laughlin AFBShambley performing speculum exam, attempting to assess for cervical lacerations.  I took over speculum exam, attempting to identify if other vaginal side wall or cervical lacerations present.  No major sidewall lacerations noted, however unable to visualize cervix adequately due to bleeding and patient discomfort. Stadol 2 mg had been given via IV at 1125, fentanyl 100 mcg given 1135. Did not desire to administer any further analgesics at that time due to risk of respiratory depression. Discussed exam under anesthesia with patient where possible lacerations could be identified and repaired without discomfort.  Patient was in agreement.  Decision was made to proceed to the operating room.  One laparotomy sponge was placed in the patient's vagina for packing.  Procedure discussed including risks and benefits, consent forms signed prior to proceeding to OR.  Patient's vitals were relatively stable except for noted tachycardia in the 120s en route to OR.  Started 1 unit of PRBCs en route to the OR.  Stat CBC obtained prior to administration of blood products.    Hildred LaserAnika Rosalyn Archambault, MD [facility]

## 2015-11-03 NOTE — Progress Notes (Signed)
Pros and Cons discussed with patient, pt. In agreement, Vacuum applied (Kiwi).

## 2015-11-03 NOTE — Anesthesia Postprocedure Evaluation (Signed)
Anesthesia Post Note  Patient: Shelley Graham  Procedure(s) Performed: Procedure(s) (LRB): SUTURE REPAIR PERINEAL LACERATION (N/A) EXAM UNDER ANESTHESIA (N/A)  Patient location during evaluation: PACU Anesthesia Type: General Level of consciousness: awake and alert Pain management: pain level controlled Vital Signs Assessment: post-procedure vital signs reviewed and stable Respiratory status: spontaneous breathing, nonlabored ventilation, respiratory function stable and patient connected to nasal cannula oxygen Cardiovascular status: blood pressure returned to baseline and stable Postop Assessment: no signs of nausea or vomiting Anesthetic complications: no    Last Vitals:  Filed Vitals:   11/03/15 1354 11/03/15 1419  BP:  138/87  Pulse: 116 127  Temp:  36.5 C  Resp: 20 20    Last Pain:  Filed Vitals:   11/03/15 1421  PainSc: 0-No pain                 Cleda MccreedyJoseph K Aliscia Clayton

## 2015-11-03 NOTE — Progress Notes (Addendum)
Postpartum Hemorrhage note by Nursing:  Infant born at 11:11 a.m., placenta out at 11:16, Oxytocin 40U/1000LR started at 11:16, rate 999 dose 500ml., provider requested Cytoctec at 11:18, 800 mcg given via provider via rectum, while giving continuous fundal massage. 11:21 Dr. Valentino Saxonherry called to delivery. 11:30 Dr. Valentino Saxonherry at the bedside.  Epidural infusion completed prior to delivery of baby. Lidocaine given for vaginal repair,  Stadol 2 mg given via IV at 1125, fentanyl 100 mcg given 1135.  Hemabate, 250 mcg given at 11:30 a.m. OR put on stand by.  11:50 one unit Blood started, verified by 2nd RN.  Pt. To OR at 1155 for suspected cervical laceration. EBL 2465 ml measured by RN.  See CNM & MD note.

## 2015-11-03 NOTE — Progress Notes (Signed)
Shelley Graham is a 20 y.o. G1P0 at 6964w1d by LMP admitted for induction of labor due to Hypertension.  Subjective: Reports sharp pain with contraction  Objective: BP 141/99 mmHg  Pulse 90  Temp(Src) 97.7 F (36.5 C) (Oral)  Resp 20  Ht 5\' 8"  (1.727 m)  Wt 245 lb (111.131 kg)  BMI 37.26 kg/m2  LMP 01/26/2015 (Approximate) I/O last 3 completed shifts: In: 7117.3 [I.V.:6837.4; Other:29.8; IV Piggyback:250] Out: 1050 [Urine:1050] Total I/O In: -  Out: 225 [Urine:225]  FHT:  FHR: 125 bpm, variability: moderate,  accelerations:  Present,  decelerations:  Absent UC:   irregular, every 2-3 minutes SVE:   Dilation: Lip/rim Effacement (%): 100 Station: 0 Exam by:: Millner, RN   Labs: Lab Results  Component Value Date   WBC 10.8 11/02/2015   HGB 10.9* 11/02/2015   HCT 31.7* 11/02/2015   MCV 79.8* 11/02/2015   PLT 231 11/02/2015    Assessment / Plan: Induction of labor due to gestational hypertension,  progressing well on pitocin  Labor: Progressing normally Preeclampsia:  labs stable Fetal Wellbeing:  Category I Pain Control:  Epidural I/D:  n/a Anticipated MOD:  NSVD  Shelley Graham 11/03/2015, 8:02 AM

## 2015-11-03 NOTE — Progress Notes (Signed)
RN to the bedside to encourage pt. with pushing, pt. feeling pressure but refuses to push. Pt. educated and coached on pushing process; pt. refuses to push. Rn will let pt. Labor down.  Station +1.

## 2015-11-03 NOTE — Op Note (Addendum)
Procedure(s): EXAM UNDER ANESTHESIA/VAGINAL LACERATION REPAIR Procedure Note  Shelley Graham female 20 y.o. 11/03/2015  Indications: The patient is a 20 y.o. 591P1001 female s/p recent vaginal delivery with uterine atony, postpartum hemorrhage and suspected cervical laceration. Is s/p 2nd degree perineal laceration repair.   Pre-operative Diagnosis: Uterine atony, postpartum hemorrhage, suspected cervical laceration  Post-operative Diagnosis:  Same, except no cervical laceration visualized  Surgeon: Hildred LaserAnika Lasha Echeverria, MD  Assistants: None  Anesthesia: General endotracheal anesthesia  Procedure Details: The patient was seen in the Holding Room. The risks, benefits, complications, treatment options, and expected outcomes were discussed with the patient.  The patient concurred with the proposed plan, giving informed consent.  The site of surgery properly noted/marked. The patient was taken to the Operating Room, identified as Shelley Graham and the procedure verified as Procedure(s) (LRB): SUTURE REPAIR CERVICAL LACERATION (N/A) EXAM UNDER ANESTHESIA (N/A). A Time Out was held and the above information confirmed.  She was then placed under general anesthesia without difficulty. She was placed in the dorsal lithotomy position, the vaginal packing was removed, and she was prepped and draped in a sterile manner.  A foley catheter was placed.  A sterile weighted speculum was inserted into the vagina and a Deaver retractor was used to retract the anterior vaginal wall.  The cervix was identified and grasped at the anterior lip using a ring forcep. Several ring forceps were then used to grasp circumferentially around the cervix.  No identifiable laceration was noted, however patient was noted to begin bleeding heavily again.  All instruments were removed from the vagina and a bimanual exam was performed.  The uterus was noted to be atonic.  Bimanual massage was performed and the patient was administered a  dose of 10 mg IM of pitocin. Within 1-2 minutes, the uterus was noted to obtain tone and the bleeding slowed.  All clots were removed from the lower uterine segment.    Next, the weighted speculum and Deaver retractor were then replaced into the patient's vagina.  The cervix was then identified again and circumferentially examined using several ring forceps.  No cervical lacerations were identified.  The ring forceps were removed from the cervix.  The vagina was explored and bilateral 1st degree periurethral lacerations were noted with some oozing.  The lacerations were repaired with 2-0 Vicryl in a figure-of-eight suture.  All other instruments were removed from the vagina.  The foley catheter was removed.   The patient tolerated the procedures well.  All instruments, needles, and sponge counts were correct x 2. The patient was taken to the recovery room awake, extubated and in stable condition.  She received 1 unit PRBC during surgical procedure.   Findings: Boggy atonic uterus 1st degree bilateral periurethral lacerations Intact cervix, s/p vaginal delivery changes  Estimated Blood Loss:  100 ml      Drains: straight catheterization prior to procedure with 300 ml of clear urine         Total IV Fluids:  Total I/O In: 1270 [I.V.:700; Blood:320; IV Piggyback:250] Out: 625 [Urine:525; Blood:100]  ml  Specimens: None         Implants: None         Complications:  None; patient tolerated the procedure well.         Disposition: PACU - hemodynamically stable.         Condition: stable   Hildred LaserAnika Yoshi Vicencio, MD [facility]

## 2015-11-03 NOTE — Transfer of Care (Signed)
Immediate Anesthesia Transfer of Care Note  Patient: Shelley Graham  Procedure(s) Performed: Procedure(s): SUTURE REPAIR PERINEAL LACERATION (N/A) EXAM UNDER ANESTHESIA (N/A)  Patient Location: PACU  Anesthesia Type:General  Level of Consciousness: sedated and responds to stimulation  Airway & Oxygen Therapy: Patient Spontanous Breathing and Patient connected to face mask oxygen  Post-op Assessment: Report given to RN and Post -op Vital signs reviewed and stable  Post vital signs: Reviewed and stable  Last Vitals:  Filed Vitals:   11/03/15 1303 11/03/15 1306  BP: 100/88 100/68  Pulse: 124 117  Temp: 36.7 C   Resp: 21 23    Last Pain:  Filed Vitals:   11/03/15 1306  PainSc: 10-Worst pain ever      Patients Stated Pain Goal: 3 (11/02/15 0810)  Complications: No apparent anesthesia complications

## 2015-11-03 NOTE — OR Nursing (Signed)
Removed one 18x 18 sponge from vagina.

## 2015-11-03 NOTE — Progress Notes (Signed)
Pt. To OR on 2nd floor for cervical Laceration, Dr. Valentino Saxonherry at the bedside.

## 2015-11-03 NOTE — Anesthesia Preprocedure Evaluation (Addendum)
Anesthesia Evaluation  Patient identified by MRN, date of birth, ID band Patient awake    Reviewed: Allergy & Precautions, H&P , NPO status , Patient's Chart, lab work & pertinent test results  Airway Mallampati: III  TM Distance: >3 FB Neck ROM: full    Dental  (+) Teeth Intact   Pulmonary neg shortness of breath, former smoker,           Cardiovascular Exercise Tolerance: Good hypertension, (-) Past MI  Rhythm:regular Rate:Normal     Neuro/Psych PSYCHIATRIC DISORDERS Anxiety Depression    GI/Hepatic Neg liver ROS, GERD  Controlled,  Endo/Other  negative endocrine ROS  Renal/GU negative Renal ROS     Musculoskeletal   Abdominal   Peds  Hematology negative hematology ROS (+)   Anesthesia Other Findings Post partum hemorrhage   Past Medical History:   History of migraine headaches                                Hx of chlamydia infection                                    Anxiety                                                      Depression                                                   UTI (lower urinary tract infection)                            Comment:RESOLVED   Hypertension                                                 Pregnancy induced hypertension                              BMI    Body Mass Index   37.26 kg/m 2      Reproductive/Obstetrics (+) Pregnancy                           Anesthesia Physical  Anesthesia Plan  ASA: IV and emergent  Anesthesia Plan: General ETT and Rapid Sequence   Post-op Pain Management:    Induction:   Airway Management Planned:   Additional Equipment:   Intra-op Plan:   Post-operative Plan:   Informed Consent: I have reviewed the patients History and Physical, chart, labs and discussed the procedure including the risks, benefits and alternatives for the proposed anesthesia with the patient or authorized representative who has  indicated his/her understanding and acceptance.   Dental Advisory Given  Plan Discussed with: Anesthesiologist  Anesthesia Plan Comments:         Anesthesia Quick Evaluation

## 2015-11-03 NOTE — Anesthesia Procedure Notes (Signed)
Procedure Name: Intubation Performed by: Junious SilkNOLES, Erielle Gawronski Pre-anesthesia Checklist: Patient identified, Patient being monitored, Timeout performed, Emergency Drugs available and Suction available Patient Re-evaluated:Patient Re-evaluated prior to inductionOxygen Delivery Method: Circle system utilized Preoxygenation: Pre-oxygenation with 100% oxygen Intubation Type: IV induction, Cricoid Pressure applied and Rapid sequence Laryngoscope Size: 3 and McGraph Grade View: Grade I Tube type: Oral Tube size: 7.0 mm Number of attempts: 1 Airway Equipment and Method: Stylet and Video-laryngoscopy Placement Confirmation: ETT inserted through vocal cords under direct vision,  positive ETCO2 and breath sounds checked- equal and bilateral Secured at: 21 cm Tube secured with: Tape Dental Injury: Teeth and Oropharynx as per pre-operative assessment

## 2015-11-04 LAB — TYPE AND SCREEN
ABO/RH(D): A POS
Antibody Screen: NEGATIVE
UNIT DIVISION: 0
Unit division: 0
Unit division: 0

## 2015-11-04 LAB — CBC
HEMATOCRIT: 21.4 % — AB (ref 35.0–47.0)
HEMATOCRIT: 22.2 % — AB (ref 35.0–47.0)
HEMOGLOBIN: 7.4 g/dL — AB (ref 12.0–16.0)
Hemoglobin: 7.3 g/dL — ABNORMAL LOW (ref 12.0–16.0)
MCH: 27.3 pg (ref 26.0–34.0)
MCH: 26.9 pg (ref 26.0–34.0)
MCHC: 33.9 g/dL (ref 32.0–36.0)
MCHC: 33.6 g/dL (ref 32.0–36.0)
MCV: 80.5 fL (ref 80.0–100.0)
MCV: 80.1 fL (ref 80.0–100.0)
Platelets: 197 10*3/uL (ref 150–440)
Platelets: 205 10*3/uL (ref 150–440)
RBC: 2.66 MIL/uL — ABNORMAL LOW (ref 3.80–5.20)
RBC: 2.77 MIL/uL — ABNORMAL LOW (ref 3.80–5.20)
RDW: 15.1 % — AB (ref 11.5–14.5)
RDW: 15.2 % — AB (ref 11.5–14.5)
WBC: 17.6 10*3/uL — ABNORMAL HIGH (ref 3.6–11.0)
WBC: 16.2 10*3/uL — ABNORMAL HIGH (ref 3.6–11.0)

## 2015-11-04 LAB — RUBELLA SCREEN: Rubella: 1.06 index (ref >0.99)

## 2015-11-04 LAB — HEMOGLOBIN AND HEMATOCRIT, BLOOD
HCT: 21.4 % — ABNORMAL LOW (ref 35.0–47.0)
Hemoglobin: 7.4 g/dL — ABNORMAL LOW (ref 12.0–16.0)

## 2015-11-04 NOTE — Progress Notes (Addendum)
Post Partum Day 1 Subjective: No problems or concerns. Patient states doing well. Reports decreased bleeding and pain controlled with pain medications. Denies calf pain.  No report of dizziness, shortness of breath or chest pain.    Objective: Blood pressure 92/66, pulse 120, temperature 98.6 F (37 C), temperature source Oral, resp. rate 18, height 5\' 8"  (1.727 m), weight 111.131 kg (245 lb), last menstrual period 01/26/2015, SpO2 97 %, unknown if currently breastfeeding.  Physical Exam:  General: Face normal color;  alert, cooperative and appears stated age Lochia: appropriate Uterine Fundus: firm Incision: n/a DVT Evaluation: No evidence of DVT seen on physical exam. Negative Homan's sign.   Recent Labs  11/04/15 0124 11/04/15 0528  HGB 7.4* 7.3*  HCT 21.4* 21.4*    Assessment/Plan: PP Day #1 s/p NSVD and PPH - asymptomatic  Saline Lock Consulted with Dr. Valentino Saxonherry; Recheck CBC at 1300 Reassess patient  prn   LOS: 3 days   Marlis EdelsonKARIM, WALIDAH N 11/04/2015, 9:51 AM

## 2015-11-04 NOTE — Anesthesia Postprocedure Evaluation (Signed)
Anesthesia Post Note  Patient: Shelley Graham  Procedure(s) Performed: * No procedures listed *  Patient location during evaluation: Mother Baby Anesthesia Type: Epidural Level of consciousness: awake and alert and oriented Pain management: pain level controlled Vital Signs Assessment: post-procedure vital signs reviewed and stable Respiratory status: spontaneous breathing Cardiovascular status: stable Postop Assessment: no headache, no signs of nausea or vomiting and adequate PO intake Anesthetic complications: no    Last Vitals:  Filed Vitals:   11/04/15 0012 11/04/15 0350  BP: 140/58 94/65  Pulse: 100 124  Temp: 37.4 C   Resp: 18 18    Last Pain:  Filed Vitals:   11/04/15 0349  PainSc: 1                  Dustan Hyams,  Alessandra BevelsJennifer M

## 2015-11-05 LAB — CBC
HEMATOCRIT: 20.4 % — AB (ref 35.0–47.0)
HEMOGLOBIN: 7 g/dL — AB (ref 12.0–16.0)
MCH: 27.5 pg (ref 26.0–34.0)
MCHC: 34.3 g/dL (ref 32.0–36.0)
MCV: 80.1 fL (ref 80.0–100.0)
Platelets: 224 10*3/uL (ref 150–440)
RBC: 2.55 MIL/uL — ABNORMAL LOW (ref 3.80–5.20)
RDW: 15.4 % — ABNORMAL HIGH (ref 11.5–14.5)
WBC: 12.8 10*3/uL — AB (ref 3.6–11.0)

## 2015-11-05 MED ORDER — FUSION PLUS PO CAPS: 1.0000 | ORAL_CAPSULE | Freq: Every day | ORAL | Status: DC

## 2015-11-05 MED ORDER — IBUPROFEN 600 MG PO TABS: 600.0000 mg | ORAL_TABLET | Freq: Four times a day (QID) | ORAL | Status: DC

## 2015-11-05 NOTE — Discharge Instructions (Signed)

## 2015-11-05 NOTE — Discharge Summary (Signed)
OB Discharge Summary     Patient Name: Shelley Graham DOB: 09/22/1995 MRN: [id number]  Date of admission: 11/01/2015 Delivering MD: Purcell NailsSHAMBLEY, Shelley N   Date of discharge: 11/05/2015  Admitting diagnosis: induction of labor for Gestational Hypertension  Intrauterine pregnancy: 6153w1d     Secondary diagnosis:  Active Problems:   Gestational hypertension  Additional problems: Uterine atony, postpartum hemorrhage     Discharge diagnosis: Term Pregnancy Delivered, Gestational Hypertension and PPH                                                                                                Post partum procedures:blood transfusion  Augmentation: AROM, Pitocin and Foley Balloon  Complications: Hemorrhage>101400mL  Hospital course:  Induction of Labor With Vaginal Delivery   20 y.o. yo G1P1001 at 5653w1d was admitted to the hospital 11/01/2015 for induction of labor.  Indication for induction: Gestational hypertension.  Patient had an uncomplicated labor course as follows: Membrane Rupture Time/Date: 1:27 PM ,11/02/2015   Intrapartum Procedures: Episiotomy:                                           Lacerations:  2nd degree [3]  Patient had delivery of a Viable infant.  Information for the patient's newborn:  Shelley Graham, [name] [[id number]][[id number]]  Delivery Method: Vag-Spont   11/03/2015  Details of delivery can be found in separate delivery note.  Patient had a routine postpartum course. Patient is discharged home 11/05/2015.  No problems or concerns. Patient states doing well. Reports minimal bleeding and pain controlled with pain medications. Reports not needing to take pain medication.  Denies dizziness, shortness of breath, or palpitations. Ambulating without difficulty.   Denies calf pain.  Bottlefeeding.  Physical exam  Filed Vitals:   11/04/15 1945 11/04/15 2259 11/05/15 0808 11/05/15 0818  BP: 128/79 129/75 143/91 127/71  Pulse: 92 88 94   Temp: 98.4 F (36.9 C) 97.5 F (36.4 C) 98 F  (36.7 C)   TempSrc: Oral Oral Oral   Resp: 20 16 18    Height:      Weight:      SpO2: 98% 100% 100%    General: alert, cooperative and appears stated age CVS:  RRR, without murmur, gallops, or rubs Lungs:  CTA bilat ABD:  +BSx4, normal Lochia: appropriate Uterine Fundus: firm DVT Evaluation: No evidence of DVT seen on physical exam. Negative Homan's sign.  Labs: Lab Results  Component Value Date   WBC 16.2* 11/04/2015   HGB 7.4* 11/04/2015   HCT 22.2* 11/04/2015   MCV 80.1 11/04/2015   PLT 205 11/04/2015   CMP Latest Ref Rng 11/01/2015  Glucose 65 - 99 mg/dL 74  BUN 6 - 20 mg/dL 14  Creatinine 7.820.44 - 9.561.00 mg/dL 2.130.52  Sodium 086135 - 578145 mmol/L 136  Potassium 3.5 - 5.1 mmol/L 3.9  Chloride 101 - 111 mmol/L 107  CO2 22 - 32 mmol/L 18(L)  Calcium 8.9 - 10.3 mg/dL 9.5  Total Protein 6.5 -  8.1 g/dL 7.3  Total Bilirubin 0.3 - 1.2 mg/dL 1.0  Alkaline Phos 38 - 126 U/L 241(H)  AST 15 - 41 U/L 20  ALT 14 - 54 U/L 11(L)    Discharge instruction: per After Visit Summary and "Baby and Me Booklet".  After visit meds:    Medication List    STOP taking these medications        pantoprazole 40 MG tablet  Commonly known as:  PROTONIX     terconazole 0.4 % vaginal cream  Commonly known as:  TERAZOL 7      TAKE these medications        FUSION PLUS Caps  Take 1 capsule by mouth daily.     ibuprofen 600 MG tablet  Commonly known as:  ADVIL,MOTRIN  Take 1 tablet (600 mg total) by mouth every 6 (six) hours.     prenatal multivitamin Tabs tablet  Take 1 tablet by mouth daily at 12 noon.     sertraline 100 MG tablet  Commonly known as:  ZOLOFT  Take 0.5 tablets (50 mg total) by mouth daily.        Diet: routine diet  Activity: Advance as tolerated. Pelvic rest for 6 weeks.   Outpatient follow up:6 weeks Follow up Appt:Future Appointments Date Time Provider Department Center  12/14/2015 1:30 PM Shelley Graham, CNM [facility] None   Follow up Visit:No Follow-up on  file.  Postpartum contraception: None and undecided, plans to decide at appoitment  Newborn Data: Live born female  Birth Weight: 8 lb 9.9 oz (3910 g) APGAR: 8, 9  Baby Feeding: Bottle Disposition:home with mother   11/05/2015 Shelley Graham, CNM

## 2015-11-05 NOTE — Progress Notes (Signed)
Discharge instructions complete. Patient verbalizes understanding of teaching. Patient discharged home at 1315.

## 2015-11-06 ENCOUNTER — Encounter: Payer: Self-pay | Admitting: Obstetrics and Gynecology

## 2015-11-06 ENCOUNTER — Emergency Department
Admission: EM | Admit: 2015-11-06 | Discharge: 2015-11-07 | Disposition: A | Discharge status: Home / Self Care | Payer: Managed Care, Other (non HMO) | Attending: Emergency Medicine | Admitting: Emergency Medicine

## 2015-11-06 DIAGNOSIS — Z87891 Personal history of nicotine dependence: Secondary | ICD-10-CM | POA: Diagnosis not present

## 2015-11-06 DIAGNOSIS — F329 Major depressive disorder, single episode, unspecified: Secondary | ICD-10-CM | POA: Diagnosis not present

## 2015-11-06 DIAGNOSIS — D649 Anemia, unspecified: Secondary | ICD-10-CM | POA: Insufficient documentation

## 2015-11-06 DIAGNOSIS — F1290 Cannabis use, unspecified, uncomplicated: Secondary | ICD-10-CM | POA: Diagnosis not present

## 2015-11-06 DIAGNOSIS — I10 Essential (primary) hypertension: Secondary | ICD-10-CM | POA: Diagnosis not present

## 2015-11-06 DIAGNOSIS — R42 Dizziness and giddiness: Secondary | ICD-10-CM | POA: Diagnosis present

## 2015-11-06 NOTE — ED Notes (Signed)
Pt arrived tot he ED accompanied by her mother for dizziness, leg swelling and seeing spots. Pt reports that she gave birth 3 days ago and after giving birth she began to hemorage, therefor she received blood. Pt was released from the hospital yesterday and today began to experience the symptoms. Pt is AOx4, looks tired and weak.

## 2015-11-07 ENCOUNTER — Encounter: Payer: Self-pay | Admitting: Obstetrics and Gynecology

## 2015-11-07 ENCOUNTER — Ambulatory Visit (INDEPENDENT_AMBULATORY_CARE_PROVIDER_SITE_OTHER): Payer: Medicaid Other | Admitting: Obstetrics and Gynecology

## 2015-11-07 VITALS — BP 139/87 | HR 98 | Wt 232.2 lb

## 2015-11-07 DIAGNOSIS — D62 Acute posthemorrhagic anemia: Secondary | ICD-10-CM | POA: Diagnosis not present

## 2015-11-07 DIAGNOSIS — D649 Anemia, unspecified: Secondary | ICD-10-CM | POA: Diagnosis not present

## 2015-11-07 LAB — CBC WITH DIFFERENTIAL/PLATELET
BASOS ABS: 0 10*3/uL (ref 0–0.1)
BASOS PCT: 0 %
EOS ABS: 0.3 10*3/uL (ref 0–0.7)
EOS PCT: 3 %
HCT: 20.5 % — ABNORMAL LOW (ref 35.0–47.0)
Hemoglobin: 7 g/dL — ABNORMAL LOW (ref 12.0–16.0)
LYMPHS PCT: 27 %
Lymphs Abs: 3 10*3/uL (ref 1.0–3.6)
MCH: 27.6 pg (ref 26.0–34.0)
MCHC: 34.1 g/dL (ref 32.0–36.0)
MCV: 80.8 fL (ref 80.0–100.0)
Monocytes Absolute: 0.7 10*3/uL (ref 0.2–0.9)
Monocytes Relative: 6 %
Neutro Abs: 6.9 10*3/uL — ABNORMAL HIGH (ref 1.4–6.5)
Neutrophils Relative %: 64 %
PLATELETS: 283 10*3/uL (ref 150–440)
RBC: 2.54 MIL/uL — AB (ref 3.80–5.20)
RDW: 14.9 % — AB (ref 11.5–14.5)
WBC: 10.9 10*3/uL (ref 3.6–11.0)

## 2015-11-07 LAB — BASIC METABOLIC PANEL
ANION GAP: 9 (ref 5–15)
BUN: 14 mg/dL (ref 6–20)
CALCIUM: 8.6 mg/dL — AB (ref 8.9–10.3)
CO2: 24 mmol/L (ref 22–32)
Chloride: 107 mmol/L (ref 101–111)
Creatinine, Ser: 0.56 mg/dL (ref 0.44–1.00)
GFR calc Af Amer: >60 mL/min (ref >60)
Glucose, Bld: 85 mg/dL (ref 65–99)
POTASSIUM: 3.5 mmol/L (ref 3.5–5.1)
SODIUM: 140 mmol/L (ref 135–145)

## 2015-11-07 LAB — TYPE AND SCREEN
ABO/RH(D): A POS
Antibody Screen: NEGATIVE

## 2015-11-07 LAB — CBC
HCT: 20 % — ABNORMAL LOW (ref 35.0–47.0)
Hemoglobin: 6.9 g/dL — ABNORMAL LOW (ref 12.0–16.0)
MCH: 27.9 pg (ref 26.0–34.0)
MCHC: 34.3 g/dL (ref 32.0–36.0)
MCV: 81.5 fL (ref 80.0–100.0)
Platelets: 265 10*3/uL (ref 150–440)
RBC: 2.45 MIL/uL — ABNORMAL LOW (ref 3.80–5.20)
RDW: 15.5 % — ABNORMAL HIGH (ref 11.5–14.5)
WBC: 9.8 10*3/uL (ref 3.6–11.0)

## 2015-11-07 MED ORDER — SODIUM CHLORIDE 0.9 % IV BOLUS (SEPSIS)
1000.0000 mL | Freq: Once | INTRAVENOUS | Status: AC
  Administered 2015-11-07: 1000 mL via INTRAVENOUS

## 2015-11-07 MED ORDER — FUSION PLUS PO CAPS: 1.0000 | ORAL_CAPSULE | Freq: Every day | ORAL | Status: DC

## 2015-11-07 MED ORDER — PRENATAL MULTIVITAMIN CH: 1.0000 | ORAL_TABLET | Freq: Every day | ORAL | Status: DC

## 2015-11-07 MED ORDER — ACETAMINOPHEN 500 MG PO TABS
1000.0000 mg | ORAL_TABLET | Freq: Once | ORAL | Status: AC
  Administered 2015-11-07: 1000 mg via ORAL

## 2015-11-07 MED ORDER — IBUPROFEN 600 MG PO TABS: 600.0000 mg | ORAL_TABLET | Freq: Four times a day (QID) | ORAL | Status: DC

## 2015-11-07 MED ORDER — FUSION PLUS PO CAPS: 1.0000 | ORAL_CAPSULE | Freq: Two times a day (BID) | ORAL | Status: DC

## 2015-11-07 MED ORDER — CITRANATAL B-CALM 20-1 MG & 2 X 25 MG PO MISC: 20.0000 mg | Freq: Every day | ORAL | Status: DC

## 2015-11-07 MED ORDER — ACETAMINOPHEN 500 MG PO TABS
ORAL_TABLET | ORAL | Status: AC
  Administered 2015-11-07: 1000 mg via ORAL
  Filled 2015-11-07: qty 2

## 2015-11-07 NOTE — Progress Notes (Signed)
Chief complaint: 1. ER follow-up 2. Postpartum anemia  Patient presents for follow-up from emergency room for assessment of symptomatic anemia. Patient was seen last night because of headache and scotomata. Hemoglobin is stable based on serial evaluations, with hemoglobin 6.9 and 7.0 Patient is status post spontaneous vaginal delivery complicated by uterine atony and postpartum hemorrhage requiring 1 unit blood transfusion Patient had induction of labor secondary to gestational hypertension; she is not experiencing any preeclampsia symptoms at this time  BP 139/87 mmHg  Pulse 98  Wt 232 lb 3.2 oz (105.325 kg)  Breastfeeding? No Orthostatic vital signs:  Sitting-135/83; heart rate 101  Standing-123/85; heart rate 112  Lining-139/87; heart rate 98   Patient is alert and oriented in no acute distress. Extremities: 1+ edema; warm and dry  ASSESSMENT: 1. Postpartum anemia, mildly symptomatic 2. Stable hemoglobin 6.9; 7.0 3. History of gestational hypertension requiring induction of labor 4. Status post spontaneous vaginal delivery complicated by uterine atony and postpartum hemorrhage requiring 1 unit blood transfusion  PLAN: 1. Start iron supplementation twice a day and prenatal vitamins daily 2. Maintain hydration 3. Return in 72 hours for CBC and vital signs check  A total of 15 minutes were spent face-to-face with the patient during this encounter and over half of that time dealt with counseling and coordination of care.  Herold HarmsMartin A Mitali Shenefield, MD  Note: This dictation was prepared with Dragon dictation along with smaller phrase technology. Any transcriptional errors that result from this process are unintentional.

## 2015-11-07 NOTE — Addendum Note (Signed)
Addended by: Marchelle FolksMILLER, Gearldene Fiorenza G on: 11/07/2015 10:01 AM   Modules accepted: Orders

## 2015-11-07 NOTE — Addendum Note (Signed)
Addended by: Marchelle FolksMILLER, Aayden Cefalu G on: 11/07/2015 10:18 AM   Modules accepted: Orders, Medications

## 2015-11-07 NOTE — ED Notes (Signed)
While doing orthostatics, pt had to immed sit down when doing the standing b/p due to weakness all over.  Denies dizziness

## 2015-11-07 NOTE — Discharge Instructions (Signed)
Please seek medical attention for any high fevers, chest pain, shortness of breath, change in behavior, persistent vomiting, bloody stool or any other new or concerning symptoms.   Anemia, Nonspecific Anemia is a condition in which the concentration of red blood cells or hemoglobin in the blood is below normal. Hemoglobin is a substance in red blood cells that carries oxygen to the tissues of the body. Anemia results in not enough oxygen reaching these tissues.  CAUSES  Common causes of anemia include:   Excessive bleeding. Bleeding may be internal or external. This includes excessive bleeding from periods (in women) or from the intestine.   Poor nutrition.   Chronic kidney, thyroid, and liver disease.  Bone marrow disorders that decrease red blood cell production.  Cancer and treatments for cancer.  HIV, AIDS, and their treatments.  Spleen problems that increase red blood cell destruction.  Blood disorders.  Excess destruction of red blood cells due to infection, medicines, and autoimmune disorders. SIGNS AND SYMPTOMS   Minor weakness.   Dizziness.   Headache.  Palpitations.   Shortness of breath, especially with exercise.   Paleness.  Cold sensitivity.  Indigestion.  Nausea.  Difficulty sleeping.  Difficulty concentrating. Symptoms may occur suddenly or they may develop slowly.  DIAGNOSIS  Additional blood tests are often needed. These help your health care provider determine the best treatment. Your health care provider will check your stool for blood and look for other causes of blood loss.  TREATMENT  Treatment varies depending on the cause of the anemia. Treatment can include:   Supplements of iron, vitamin B12, or folic acid.   Hormone medicines.   A blood transfusion. This may be needed if blood loss is severe.   Hospitalization. This may be needed if there is significant continual blood loss.   Dietary changes.  Spleen removal. HOME  CARE INSTRUCTIONS Keep all follow-up appointments. It often takes many weeks to correct anemia, and having your health care provider check on your condition and your response to treatment is very important. SEEK IMMEDIATE MEDICAL CARE IF:   You develop extreme weakness, shortness of breath, or chest pain.   You become dizzy or have trouble concentrating.  You develop heavy vaginal bleeding.   You develop a rash.   You have bloody or black, tarry stools.   You faint.   You vomit up blood.   You vomit repeatedly.   You have abdominal pain.  You have a fever or persistent symptoms for more than 2-3 days.   You have a fever and your symptoms suddenly get worse.   You are dehydrated.  MAKE SURE YOU:  Understand these instructions.  Will watch your condition.  Will get help right away if you are not doing well or get worse.   This information is not intended to replace advice given to you by your health care provider. Make sure you discuss any questions you have with your health care provider.   Document Released: 05/23/2004 Document Revised: 12/16/2012 Document Reviewed: 10/09/2012 Elsevier Interactive Patient Education [employer]2016 Elsevier Inc.

## 2015-11-07 NOTE — ED Notes (Signed)
Discharge instructions reviewed with patient. Patient verbalized understanding. Patient taken to lobby via wheelchair and helped into vehicle without difficulty.  

## 2015-11-07 NOTE — Patient Instructions (Signed)
1. Iron twice daily 2. Prenatal vitamins daily 3. Maintain hydration 4. Return on Friday for a CBC check and vital signs check 5. Return sooner if any symptoms worsen

## 2015-11-07 NOTE — ED Provider Notes (Signed)
[facility] Emergency Department Provider Note   ____________________________________________  Time seen: ~0100  I have reviewed the triage vital signs and the nursing notes.   HISTORY  Chief Complaint Dizziness; Leg Swelling; and Spots and/or Floaters   History limited by: Not Limited   HPI Shelley Graham is a 20 y.o. female who presents to the emergency department today because of concerns for dizziness, headache and seeing spots in the setting of recent vaginal delivery complicated by uterine atony and postpartum hemorrhage. She did receive one unit of blood her in the hospital. She was discharged yesterday. Roughly 4 hours ago she started developing her symptoms. They are present when she stands up. When she lies flat she states that she does not feel dizzy or sees spots. She denies any significant bleeding since discharge. States she has been eating and drinking a fair amount. She denies any fevers.   Past Medical History  Diagnosis Date  . History of migraine headaches   . Hx of chlamydia infection   . Anxiety   . Depression   . UTI (lower urinary tract infection)     RESOLVED  . Hypertension   . Pregnancy induced hypertension     Patient Active Problem List   Diagnosis Date Noted  . Gestational hypertension 11/01/2015  . Obesity (BMI 30-39.9) 09/27/2015  . Gallstones and inflammation of gallbladder without obstruction 05/25/2015  . Marijuana use, episodic 05/25/2015  . GBS bacteriuria 03/14/2015  . Maternal varicella, non-immune 03/10/2015  . Rubella non-immune status, antepartum 03/10/2015  . History of migraine headaches   . Anxiety   . Depression     Past Surgical History  Procedure Laterality Date  . Other surgical history      tubes ears 18 months  . Tubes in ears Bilateral   . Perineal laceration repair N/A 11/03/2015    Procedure: SUTURE REPAIR PERINEAL LACERATION;  Surgeon: Hildred LaserAnika Cherry, MD;  Location: [facility] ORS;  Service:  Gynecology;  Laterality: N/A;    Current Outpatient Rx  Name  Route  Sig  Dispense  Refill  . ibuprofen (ADVIL,MOTRIN) 600 MG tablet   Oral   Take 1 tablet (600 mg total) by mouth every 6 (six) hours.   30 tablet   0   . Iron-FA-B Cmp-C-Biot-Probiotic (FUSION PLUS) CAPS   Oral   Take 1 capsule by mouth daily.   30 capsule   6   . Prenatal Vit-Fe Fumarate-FA (PRENATAL MULTIVITAMIN) TABS tablet   Oral   Take 1 tablet by mouth daily at 12 noon.           Allergies Ceftriaxone sodium in dextrose  Family History  Problem Relation Age of Onset  . Asthma Mother     as child  . Asthma Brother   . Cancer Father   . Cancer Maternal Grandmother     colon  . Cancer Paternal Grandfather     lung  . Migraines Mother   . Thyroid disease Mother     Social History Social History  Substance Use Topics  . Smoking status: Former Smoker -- 0.25 packs/day for 6 years    Types: Cigarettes    Quit date: 03/15/2015  . Smokeless tobacco: Never Used  . Alcohol Use: No    Review of Systems  Constitutional: Negative for fever. Cardiovascular: Negative for chest pain. Respiratory: Negative for shortness of breath. Gastrointestinal: Negative for abdominal pain, vomiting and diarrhea. Genitourinary: Negative for dysuria. Neurological: Negative for headaches, focal weakness or numbness.  10-point ROS otherwise negative.  ____________________________________________   PHYSICAL EXAM:  VITAL SIGNS: ED Triage Vitals  Enc Vitals Group     BP --      Pulse Rate 11/06/15 2340 95     Resp 11/06/15 2340 20     Temp 11/06/15 2340 98.4 F (36.9 C)     Temp Source 11/06/15 2340 Oral     SpO2 11/06/15 2340 100 %     Weight 11/06/15 2340 242 lb (109.77 kg)     Height 11/06/15 2340 5\' 8"  (1.727 m)     Head Cir --      Peak Flow --      Pain Score 11/06/15 2342 4   Constitutional: Alert and oriented. Well appearing and in no distress. Eyes: Conjunctivae are normal. PERRL. Normal  extraocular movements. ENT   Head: Normocephalic and atraumatic.   Nose: No congestion/rhinnorhea.   Mouth/Throat: Mucous membranes are moist.   Neck: No stridor. Hematological/Lymphatic/Immunilogical: No cervical lymphadenopathy. Cardiovascular: Normal rate, regular rhythm.  No murmurs, rubs, or gallops. Respiratory: Normal respiratory effort without tachypnea nor retractions. Breath sounds are clear and equal bilaterally. No wheezes/rales/rhonchi. Gastrointestinal: Soft and nontender. No distention. There is no CVA tenderness. Genitourinary: Deferred Musculoskeletal: Normal range of motion in all extremities. No joint effusions.  No lower extremity tenderness nor edema. Neurologic:  Normal speech and language. No gross focal neurologic deficits are appreciated.  Skin:  Skin is warm, dry and intact. No rash noted. Psychiatric: Mood and affect are normal. Speech and behavior are normal. Patient exhibits appropriate insight and judgment.  ____________________________________________    LABS (pertinent positives/negatives)  Labs Reviewed  CBC WITH DIFFERENTIAL/PLATELET - Abnormal; Notable for the following:    RBC 2.54 (*)    Hemoglobin 7.0 (*)    HCT 20.5 (*)    RDW 14.9 (*)    Neutro Abs 6.9 (*)    All other components within normal limits  BASIC METABOLIC PANEL - Abnormal; Notable for the following:    Calcium 8.6 (*)    All other components within normal limits  CBC - Abnormal; Notable for the following:    RBC 2.45 (*)    Hemoglobin 6.9 (*)    HCT 20.0 (*)    RDW 15.5 (*)    All other components within normal limits  TYPE AND SCREEN     ____________________________________________   EKG  None  ____________________________________________    RADIOLOGY  None  ____________________________________________   PROCEDURES  Procedure(s) performed: None  Critical Care performed: No  ____________________________________________   INITIAL  IMPRESSION / ASSESSMENT AND PLAN / ED COURSE  Pertinent labs & imaging results that were available during my care of the patient were reviewed by me and considered in my medical decision making (see chart for details).  Patient presented to the emergency department today because of concerns for dizziness. Patient recently had a admission for delivery complicated by postpartum hemorrhage which required 1 unit of blood. Initial blood work showed hemoglobin of 7. At this point I talked to Dr. Greggory Keen with OB/GYN who stated he would be able to see her in his clinic early in the morning. Patient was given a liter of fluid here which did seem to help somewhat with the patient's symptoms. She remained slightly tachycardic with standing however blood pressure was stable. Repeat hemoglobin was 6.9. Felt that patient was safe to follow up with OB/GYN in clinic in a couple of hours.  ____________________________________________   FINAL CLINICAL IMPRESSION(S) / ED DIAGNOSES  Final diagnoses:  Anemia, unspecified anemia type     Note: This dictation was prepared with Dragon dictation. Any transcriptional errors that result from this process are unintentional    Phineas Semen, MD 11/07/15 [phone]

## 2015-11-10 ENCOUNTER — Ambulatory Visit (INDEPENDENT_AMBULATORY_CARE_PROVIDER_SITE_OTHER): Payer: Medicaid Other | Admitting: Obstetrics and Gynecology

## 2015-11-10 ENCOUNTER — Other Ambulatory Visit: Payer: Medicaid Other

## 2015-11-10 VITALS — BP 135/86 | HR 121

## 2015-11-10 DIAGNOSIS — D62 Acute posthemorrhagic anemia: Secondary | ICD-10-CM

## 2015-11-10 MED ORDER — POLYSACCHARIDE IRON COMPLEX 150 MG PO CAPS: 150.0000 mg | ORAL_CAPSULE | Freq: Every day | ORAL | Status: DC

## 2015-11-10 NOTE — Progress Notes (Signed)
Orthostatic VS for the past 24 hrs:  BP- Lying Pulse- Lying BP- Sitting Pulse- Sitting BP- Standing at 0 minutes Pulse- Standing at 0 minutes  11/10/15 1205 124/85 mmHg 89 132/90 mmHg 99 135/86 mmHg 121   Pt presents today for BP check and CBC for postpartum anemia. Pt states that she has been compliant with iron supplement and overall feels much better. Pt c/o back pain not relieved with motrin, advised pt on the use of Tylenol and heating pad. To call back if no improvement.

## 2015-11-10 NOTE — Patient Instructions (Signed)
Anemia, Nonspecific Anemia is a condition in which the concentration of red blood cells or hemoglobin in the blood is below normal. Hemoglobin is a substance in red blood cells that carries oxygen to the tissues of the body. Anemia results in not enough oxygen reaching these tissues.  CAUSES  Common causes of anemia include:   Excessive bleeding. Bleeding may be internal or external. This includes excessive bleeding from periods (in women) or from the intestine.   Poor nutrition.   Chronic kidney, thyroid, and liver disease.  Bone marrow disorders that decrease red blood cell production.  Cancer and treatments for cancer.  HIV, AIDS, and their treatments.  Spleen problems that increase red blood cell destruction.  Blood disorders.  Excess destruction of red blood cells due to infection, medicines, and autoimmune disorders. SIGNS AND SYMPTOMS   Minor weakness.   Dizziness.   Headache.  Palpitations.   Shortness of breath, especially with exercise.   Paleness.  Cold sensitivity.  Indigestion.  Nausea.  Difficulty sleeping.  Difficulty concentrating. Symptoms may occur suddenly or they may develop slowly.  DIAGNOSIS  Additional blood tests are often needed. These help your health care provider determine the best treatment. Your health care provider will check your stool for blood and look for other causes of blood loss.  TREATMENT  Treatment varies depending on the cause of the anemia. Treatment can include:   Supplements of iron, vitamin B12, or folic acid.   Hormone medicines.   A blood transfusion. This may be needed if blood loss is severe.   Hospitalization. This may be needed if there is significant continual blood loss.   Dietary changes.  Spleen removal. HOME CARE INSTRUCTIONS Keep all follow-up appointments. It often takes many weeks to correct anemia, and having your health care provider check on your condition and your response to  treatment is very important. SEEK IMMEDIATE MEDICAL CARE IF:   You develop extreme weakness, shortness of breath, or chest pain.   You become dizzy or have trouble concentrating.  You develop heavy vaginal bleeding.   You develop a rash.   You have bloody or black, tarry stools.   You faint.   You vomit up blood.   You vomit repeatedly.   You have abdominal pain.  You have a fever or persistent symptoms for more than 2-3 days.   You have a fever and your symptoms suddenly get worse.   You are dehydrated.  MAKE SURE YOU:  Understand these instructions.  Will watch your condition.  Will get help right away if you are not doing well or get worse.   This information is not intended to replace advice given to you by your health care provider. Make sure you discuss any questions you have with your health care provider.   Document Released: 05/23/2004 Document Revised: 12/16/2012 Document Reviewed: 10/09/2012 Elsevier Interactive Patient Education 2016 Elsevier Inc.  

## 2015-11-11 LAB — CBC WITH DIFFERENTIAL/PLATELET
BASOS: 0 %
Basophils Absolute: 0 10*3/uL (ref 0.0–0.2)
EOS (ABSOLUTE): 0.2 10*3/uL (ref 0.0–0.4)
EOS: 2 %
HEMOGLOBIN: 8.3 g/dL — AB (ref 11.1–15.9)
Hematocrit: 26.7 % — ABNORMAL LOW (ref 34.0–46.6)
IMMATURE GRANS (ABS): 0.1 10*3/uL (ref 0.0–0.1)
IMMATURE GRANULOCYTES: 1 %
LYMPHS: 27 %
Lymphocytes Absolute: 2.5 10*3/uL (ref 0.7–3.1)
MCH: 26.2 pg — AB (ref 26.6–33.0)
MCHC: 31.1 g/dL — ABNORMAL LOW (ref 31.5–35.7)
MCV: 84 fL (ref 79–97)
MONOCYTES: 6 %
Monocytes Absolute: 0.5 10*3/uL (ref 0.1–0.9)
NEUTROS ABS: 5.9 10*3/uL (ref 1.4–7.0)
NEUTROS PCT: 64 %
PLATELETS: 498 10*3/uL — AB (ref 150–379)
RBC: 3.17 x10E6/uL — ABNORMAL LOW (ref 3.77–5.28)
RDW: 16.7 % — ABNORMAL HIGH (ref 12.3–15.4)
WBC: 9.2 10*3/uL (ref 3.4–10.8)

## 2015-11-13 ENCOUNTER — Telehealth: Payer: Self-pay | Admitting: Obstetrics and Gynecology

## 2015-11-13 NOTE — Telephone Encounter (Signed)
Patient called stating she is still in pain and didn't know what she should do. Please Advise.

## 2015-11-14 ENCOUNTER — Other Ambulatory Visit: Payer: Self-pay

## 2015-11-14 ENCOUNTER — Other Ambulatory Visit: Payer: Self-pay | Admitting: Obstetrics and Gynecology

## 2015-11-14 ENCOUNTER — Telehealth: Payer: Self-pay | Admitting: *Deleted

## 2015-11-14 MED ORDER — IBUPROFEN 600 MG PO TABS: 600.0000 mg | ORAL_TABLET | Freq: Four times a day (QID) | ORAL | Status: DC

## 2015-11-14 MED ORDER — POLYSACCHARIDE IRON COMPLEX 150 MG PO CAPS: 150.0000 mg | ORAL_CAPSULE | Freq: Every day | ORAL | Status: DC

## 2015-11-14 NOTE — Telephone Encounter (Signed)
I sent in a refill on her prescription strength motrin. Also OK to have her try benedryl for the rash. If either get worse she needs to go to ED for evaluation

## 2015-11-14 NOTE — Telephone Encounter (Signed)
Pt came in the office wanting to speak with Melody or Carzell Saldivar, I went to waiting room spoke with pt, I advised her to take tylenol and alternate with advil, she said well what about this rash on my body, I said well does it itch, how long as it been there? We discussed her rash I told her I was going to go and speak with Melody and I would be back, now remind you she walked in and we have a full schedule.  I then get a call from Gabriel RainwaterRobin Fuller who states pt is on the phone cussing and wants to speak with Melody or Meeka Cartelli.  I picked up the phone and she was cussing I said Elmarie Shileyiffany Im sorry about your pain medication, she proceeded to tell me " to kiss her ass, she wasn't ever coming back here and when she was pregnant Melody gave her rx for pain medication and she wouldn't take it due to being pregnant", however she asked for rx everytime she came in, then we sent out a drug screen and no pain medication was in her urine, I tried to give her Charlesetta IvoryDenise Collins name our manager and she said "fuck all yaw, she isnt going to do shit for me and Melody always had a smart ass answer and she was screaming and yelling and cussing and I told her to have a good day and I hung up.

## 2015-12-04 ENCOUNTER — Emergency Department: Payer: Managed Care, Other (non HMO)

## 2015-12-04 ENCOUNTER — Encounter: Payer: Self-pay | Admitting: Emergency Medicine

## 2015-12-04 ENCOUNTER — Emergency Department
Admission: EM | Admit: 2015-12-04 | Discharge: 2015-12-04 | Disposition: A | Discharge status: Home / Self Care | Payer: Managed Care, Other (non HMO) | Attending: Emergency Medicine | Admitting: Emergency Medicine

## 2015-12-04 DIAGNOSIS — K8050 Calculus of bile duct without cholangitis or cholecystitis without obstruction: Secondary | ICD-10-CM | POA: Diagnosis not present

## 2015-12-04 DIAGNOSIS — Z791 Long term (current) use of non-steroidal anti-inflammatories (NSAID): Secondary | ICD-10-CM | POA: Insufficient documentation

## 2015-12-04 DIAGNOSIS — R197 Diarrhea, unspecified: Secondary | ICD-10-CM | POA: Diagnosis not present

## 2015-12-04 DIAGNOSIS — I10 Essential (primary) hypertension: Secondary | ICD-10-CM | POA: Diagnosis not present

## 2015-12-04 DIAGNOSIS — Z87891 Personal history of nicotine dependence: Secondary | ICD-10-CM | POA: Diagnosis not present

## 2015-12-04 DIAGNOSIS — R1011 Right upper quadrant pain: Secondary | ICD-10-CM | POA: Diagnosis present

## 2015-12-04 DIAGNOSIS — F1290 Cannabis use, unspecified, uncomplicated: Secondary | ICD-10-CM | POA: Insufficient documentation

## 2015-12-04 LAB — URINALYSIS COMPLETE WITH MICROSCOPIC (ARMC ONLY)
Bilirubin Urine: NEGATIVE
GLUCOSE, UA: NEGATIVE mg/dL
Ketones, ur: NEGATIVE mg/dL
NITRITE: NEGATIVE
Protein, ur: NEGATIVE mg/dL
SPECIFIC GRAVITY, URINE: 1.006 (ref 1.005–1.030)
pH: 7 (ref 5.0–8.0)

## 2015-12-04 LAB — COMPREHENSIVE METABOLIC PANEL
ALK PHOS: 271 U/L — AB (ref 38–126)
ALT: 209 U/L — ABNORMAL HIGH (ref 14–54)
ANION GAP: 8 (ref 5–15)
AST: 397 U/L — ABNORMAL HIGH (ref 15–41)
Albumin: 4.3 g/dL (ref 3.5–5.0)
BILIRUBIN TOTAL: 1.8 mg/dL — AB (ref 0.3–1.2)
BUN: 11 mg/dL (ref 6–20)
CALCIUM: 11.2 mg/dL — AB (ref 8.9–10.3)
CO2: 27 mmol/L (ref 22–32)
Chloride: 104 mmol/L (ref 101–111)
Creatinine, Ser: 0.71 mg/dL (ref 0.44–1.00)
Glucose, Bld: 109 mg/dL — ABNORMAL HIGH (ref 65–99)
Potassium: 3.6 mmol/L (ref 3.5–5.1)
Sodium: 139 mmol/L (ref 135–145)
TOTAL PROTEIN: 7.9 g/dL (ref 6.5–8.1)

## 2015-12-04 LAB — CBC
HCT: 34.3 % — ABNORMAL LOW (ref 35.0–47.0)
HEMOGLOBIN: 11.3 g/dL — AB (ref 12.0–16.0)
MCH: 25.6 pg — ABNORMAL LOW (ref 26.0–34.0)
MCHC: 32.9 g/dL (ref 32.0–36.0)
MCV: 77.9 fL — ABNORMAL LOW (ref 80.0–100.0)
Platelets: 364 10*3/uL (ref 150–440)
RBC: 4.4 MIL/uL (ref 3.80–5.20)
RDW: 16.5 % — ABNORMAL HIGH (ref 11.5–14.5)
WBC: 9.7 10*3/uL (ref 3.6–11.0)

## 2015-12-04 LAB — LIPASE, BLOOD: Lipase: 24 U/L (ref 11–51)

## 2015-12-04 MED ORDER — ONDANSETRON 4 MG PO TBDP: 4.0000 mg | ORAL_TABLET | Freq: Three times a day (TID) | ORAL | 0 refills | Status: DC | PRN

## 2015-12-04 MED ORDER — HYDROCODONE-ACETAMINOPHEN 5-325 MG PO TABS: 1.0000 | ORAL_TABLET | ORAL | 0 refills | Status: DC | PRN

## 2015-12-04 NOTE — ED Provider Notes (Signed)
[facility] Emergency Department Provider Note  Time seen: 5:13 PM  I have reviewed the triage vital signs and the nursing notes.   HISTORY  Chief Complaint Diarrhea; Abdominal Pain; Nausea; and Emesis    HPI Shelley Dalesiffany J Graham is a 20 y.o. female the past medical history of hypertension, who presents the emergency department with abdominal discomfort, nausea and vomiting. According to the patient during her pregnancy she was diagnosed with possible gallbladder disease. She states she was having attacks of gallbladder pain which she describes as tightness in the upper abdomen associated with nausea and vomiting. She had her child in July however she continues to have pain, states the pain has been worsening since having her child. She states last night was the worst, she states the pain lasted for several hours along with vomiting felt like she was going to pass out. States she was still having some discomfort today so she came to the emergency department for evaluation. Has not noticed an association with food. Describes her "attack" is having upper abdominal "tightness" moderate in severity associated with nausea and often times vomiting. She also states intermittent diarrhea for the past several days.  Past Medical History:  Diagnosis Date  . Anxiety   . Depression   . History of migraine headaches   . Hx of chlamydia infection   . Hypertension   . Pregnancy induced hypertension   . UTI (lower urinary tract infection)    RESOLVED    Patient Active Problem List   Diagnosis Date Noted  . Acute posthemorrhagic anemia 11/07/2015  . Gestational hypertension 11/01/2015  . Obesity (BMI 30-39.9) 09/27/2015  . Gallstones and inflammation of gallbladder without obstruction 05/25/2015  . Marijuana use, episodic 05/25/2015  . GBS bacteriuria 03/14/2015  . Maternal varicella, non-immune 03/10/2015  . Rubella non-immune status, antepartum 03/10/2015  . History of  migraine headaches   . Anxiety   . Depression     Past Surgical History:  Procedure Laterality Date  . OTHER SURGICAL HISTORY     tubes ears 18 months  . PERINEAL LACERATION REPAIR N/A 11/03/2015   Procedure: SUTURE REPAIR PERINEAL LACERATION;  Surgeon: Hildred LaserAnika Cherry, MD;  Location: [facility] ORS;  Service: Gynecology;  Laterality: N/A;  . tubes in ears Bilateral     Prior to Admission medications   Medication Sig Start Date End Date Taking? Authorizing Provider  ibuprofen (ADVIL,MOTRIN) 600 MG tablet Take 1 tablet (600 mg total) by mouth every 6 (six) hours. 11/14/15   Melody N Shambley, CNM  iron polysaccharides (NIFEREX) 150 MG capsule Take 1 capsule (150 mg total) by mouth daily. 11/10/15   Prentice DockerMartin A Defrancesco, MD  iron polysaccharides (NU-IRON) 150 MG capsule Take 1 capsule (150 mg total) by mouth daily. 11/14/15   Prentice DockerMartin A Defrancesco, MD  Iron-FA-B Cmp-C-Biot-Probiotic (FUSION PLUS) CAPS Take 1 capsule by mouth 2 (two) times daily. 11/07/15   Prentice DockerMartin A Defrancesco, MD  Prenat w/o A FeCbnFeGlu-FA &B6 (CITRANATAL B-CALM) 20-1 & 25 (2) MG MISC Take 20 mg by mouth daily. 11/07/15   Herold HarmsMartin A Defrancesco, MD    Allergies  Allergen Reactions  . Ceftriaxone Sodium In Dextrose Rash    Reaction occurred immediately after starting IV Ceftriaxone and medication was stopped directly after symptoms began.     Family History  Problem Relation Age of Onset  . Asthma Mother     as child  . Migraines Mother   . Thyroid disease Mother   . Cancer Father   .  Cancer Maternal Grandmother     colon  . Cancer Paternal Grandfather     lung  . Asthma Brother     Social History Social History  Substance Use Topics  . Smoking status: Former Smoker    Packs/day: 0.25    Years: 6.00    Types: Cigarettes    Quit date: 03/15/2015  . Smokeless tobacco: Never Used  . Alcohol use No    Review of Systems Constitutional: Negative for fever. Cardiovascular: Negative for chest pain. Respiratory: Negative  for shortness of breath. Gastrointestinal: Upper abdominal tightness, positive for nausea, vomiting, diarrhea. Genitourinary: Negative for dysuria. Musculoskeletal: Negative for back pain. Neurological: Negative for headache 10-point ROS otherwise negative.  ____________________________________________   PHYSICAL EXAM:  VITAL SIGNS: ED Triage Vitals  Enc Vitals Group     BP 12/04/15 1550 (!) 145/88     Pulse Rate 12/04/15 1550 99     Resp 12/04/15 1550 18     Temp 12/04/15 1550 98.4 F (36.9 C)     Temp src --      SpO2 12/04/15 1550 100 %     Weight --      Height --      Head Circumference --      Peak Flow --      Pain Score 12/04/15 1458 7     Pain Loc --      Pain Edu? --      Excl. in GC? --     Constitutional: Alert and oriented. Well appearing and in no distress. Eyes: Normal exam ENT   Head: Normocephalic and atraumatic.   Mouth/Throat: Mucous membranes are moist. Cardiovascular: Normal rate, regular rhythm. No murmur Respiratory: Normal respiratory effort without tachypnea nor retractions. Breath sounds are clear  Gastrointestinal: Soft and nontender. No distention. Musculoskeletal: Nontender with normal range of motion in all extremities.  Neurologic:  Normal speech and language. No gross focal neurologic deficits Skin:  Skin is warm, dry and intact.  Psychiatric: Mood and affect are normal. Speech and behavior are normal.   ____________________________________________     RADIOLOGY  Cholelithiasis without signs of active infection possible mild gallbladder wall thickening. No pericholecystic fluid.   ___________________________________________   INITIAL IMPRESSION / ASSESSMENT AND PLAN / ED COURSE  Pertinent labs & imaging results that were available during my care of the patient were reviewed by me and considered in my medical decision making (see chart for details).  The patient presents the emergency department with episodes of upper  abdominal tightness nausea and vomiting. Patient does have mild right upper quadrant tenderness to palpation currently. No epigastric tenderness. Denies fever. We will check labs, and obtain a culture on the right upper quadrant.  Liver function tests are elevated, right upper quadrant ultrasound pending at this time.  Ultrasound shows possible mild lateral wall thickening but a contracted gallbladder with gallstones present. Liver function tests are elevated. I discussed the patient with Dr. Excell Seltzer who believes the patient likely passed a CBD stone. Patient has no discomfort at this time, denies any nausea at this time. I believe the patient is safe for outpatient follow-up. Dr. Excell Seltzer states he'll see the patient in the office. We will feed the patient in the emergency department to ensure she does not have repeat abdominal pain. His lungs patient is able tolerate food in the emergency department we'll discharge with pain and nausea medication as needed with surgery follow-up as soon as possible.  Tolerated food without issue.  We will  dc with gen surg follow up.    ____________________________________________   FINAL CLINICAL IMPRESSION(S) / ED DIAGNOSES  Right upper quadrant pain Biliary colic   Minna Antis, MD 12/04/15 [id number]

## 2015-12-04 NOTE — ED Triage Notes (Signed)
Pt to ed with c/o abd pain and n/v/d every week for past month since birth of her baby.  Pt states she was dx with gallstones during pregnancy and has not seen surgeon.

## 2015-12-04 NOTE — ED Notes (Signed)
Patient transported to Ultrasound 

## 2015-12-11 ENCOUNTER — Telehealth: Payer: Self-pay | Admitting: Obstetrics and Gynecology

## 2015-12-11 ENCOUNTER — Other Ambulatory Visit: Payer: Self-pay | Admitting: *Deleted

## 2015-12-11 MED ORDER — FLUCONAZOLE 150 MG PO TABS: 150.0000 mg | ORAL_TABLET | Freq: Once | ORAL | 1 refills | Status: AC

## 2015-12-11 NOTE — Telephone Encounter (Signed)
Called pt states she has tried [facility] otc, called her in diflucan to [facility]

## 2015-12-11 NOTE — Telephone Encounter (Signed)
PT THINKS SHE HAS A YEAST INFECTION. SHE ASKED FOR SOME CREAM TO BE SENT TO [facility] [address]

## 2015-12-13 ENCOUNTER — Ambulatory Visit (INDEPENDENT_AMBULATORY_CARE_PROVIDER_SITE_OTHER): Payer: Managed Care, Other (non HMO) | Admitting: Surgery

## 2015-12-13 ENCOUNTER — Encounter: Payer: Self-pay | Admitting: Surgery

## 2015-12-13 VITALS — BP 143/83 | HR 102 | Temp 98.7°F | Ht 67.0 in | Wt 224.0 lb

## 2015-12-13 DIAGNOSIS — K8050 Calculus of bile duct without cholangitis or cholecystitis without obstruction: Secondary | ICD-10-CM

## 2015-12-13 MED ORDER — HYDROCODONE-ACETAMINOPHEN 5-325 MG PO TABS: 1.0000 | ORAL_TABLET | ORAL | 0 refills | Status: DC | PRN

## 2015-12-13 NOTE — Progress Notes (Signed)
Surgical Consultation  12/13/2015  Shelley Graham is an 20 y.o. female.   CC:abd pain  HPI: Recurrent frequent and episodic epigastric and right upper quadrant pain she had nausea and vomiting last week but has not vomited in a week she relates this to fatty food intake and while she states she has hot quite often she has not taken her temperature and she was hot this morning and her temperature was normal here at the office. Had no jaundice or acholic stools this pain started 6 months ago when she was pregnant she is now several weeks postpartum Of note she is smoking a pack of cigarettes per day and smoked cigarettes during the first part of her pregnancy. She is not working Family history noncontributory  Past Medical History:  Diagnosis Date  . Anxiety   . Depression   . History of migraine headaches   . Hx of chlamydia infection   . Hypertension   . Pregnancy induced hypertension   . UTI (lower urinary tract infection)    RESOLVED    Past Surgical History:  Procedure Laterality Date  . OTHER SURGICAL HISTORY     tubes ears 18 months  . PERINEAL LACERATION REPAIR N/A 11/03/2015   Procedure: SUTURE REPAIR PERINEAL LACERATION;  Surgeon: Hildred LaserAnika Cherry, MD;  Location: [facility] ORS;  Service: Gynecology;  Laterality: N/A;  . tubes in ears Bilateral     Family History  Problem Relation Age of Onset  . Asthma Mother     as child  . Migraines Mother   . Thyroid disease Mother   . Cancer Father   . Cancer Maternal Grandmother     colon  . Cancer Paternal Grandfather     lung  . Asthma Brother     Social History:  reports that she quit smoking about 8 months ago. Her smoking use included Cigarettes. She has a 1.50 pack-year smoking history. She has never used smokeless tobacco. She reports that she uses drugs, including Benzodiazepines and Marijuana. She reports that she does not drink alcohol.  Allergies:  Allergies  Allergen Reactions  . Ceftriaxone Sodium In Dextrose Rash     Reaction occurred immediately after starting IV Ceftriaxone and medication was stopped directly after symptoms began.     Medications reviewed.   Review of Systems:   Review of Systems  Constitutional: Negative for chills, fever and weight loss.  HENT: Negative.   Eyes: Negative.   Respiratory: Negative.   Cardiovascular: Negative.   Gastrointestinal: Positive for abdominal pain, diarrhea, nausea and vomiting. Negative for blood in stool, constipation, heartburn and melena.  Genitourinary: Negative.   Musculoskeletal: Negative.   Skin: Negative.   Neurological: Negative.   Endo/Heme/Allergies: Negative.   Psychiatric/Behavioral: Negative.      Physical Exam:  BP (!) 143/83 (BP Location: Left Arm, Patient Position: Sitting, Cuff Size: Large)   Pulse (!) 102   Temp 98.7 F (37.1 C) (Oral)   Ht 5\' 7"  (1.702 m)   Wt 224 lb (101.6 kg)   BMI 35.08 kg/m   Physical Exam  Constitutional: She is oriented to person, place, and time and well-developed, well-nourished, and in no distress. No distress.  Obese comfortable no acute distress  HENT:  Head: Normocephalic and atraumatic.  Eyes: Right eye exhibits no discharge. Left eye exhibits no discharge. No scleral icterus.  Neck: Normal range of motion.  Cardiovascular: Normal rate, regular rhythm and normal heart sounds.   Pulmonary/Chest: Effort normal and breath sounds normal. No respiratory distress.  She has no wheezes. She has no rales.  Abdominal: Soft. She exhibits no distension. There is no tenderness. There is no rebound and no guarding.  Negative Murphy sign or mass  Musculoskeletal: Normal range of motion. She exhibits no edema or tenderness.  Lymphadenopathy:    She has no cervical adenopathy.  Neurological: She is alert and oriented to person, place, and time.  Skin: Skin is warm and dry. No rash noted. She is not diaphoretic. No erythema.  No jaundice multiple tattoos  Psychiatric: Mood and affect normal.   Vitals reviewed.     No results found for this or any previous visit (from the past 48 hour(s)). No results found.  Assessment/Plan:  Studies from the emergency room are personally reviewed showing nondilated bile ducts and elevated liver function tests. She also had a negative Murphy sign at the time of her study. This a patient who has gallstones and has probably passed a bile duct stone last week she is not clinically jaundiced at this time. My regulation would be to repeat her liver function tests and schedule laparoscopic cholecystectomy with cholangiography. The rationale for this been discussed the options of observation reviewed and the risks of bleeding infection recurrence failure to resolve her symptoms conversion to an open procedure bile duct damage bile duct leak retained common bile duct stone any of which could require further surgery and/or ERCP stent and papillotomy were all discussed she understood and agreed to proceed  Lattie Hawichard E Decarlos Empey, MD, FACS

## 2015-12-13 NOTE — Addendum Note (Signed)
Addended by: Adela PortsBONICHE, Doneta Bayman on: 12/13/2015 11:01 AM   Modules accepted: Orders

## 2015-12-13 NOTE — Patient Instructions (Signed)
We will do your surgery on 12/26/2015. We will call you to give you additional information.

## 2015-12-14 ENCOUNTER — Ambulatory Visit: Payer: Medicaid Other | Admitting: Obstetrics and Gynecology

## 2015-12-14 ENCOUNTER — Telehealth: Payer: Self-pay

## 2015-12-14 NOTE — Telephone Encounter (Signed)
Patient has been scheduled for a Laparoscopic Cholecystectomy with Intra-operative Cholangiogram on 01/12/16 by Dr. Excell Seltzerooper at [facility].  PAT is scheduled for 01/04/16 by phone from 9a-1p.

## 2015-12-19 ENCOUNTER — Emergency Department: Payer: Managed Care, Other (non HMO)

## 2015-12-19 ENCOUNTER — Encounter: Payer: Self-pay | Admitting: *Deleted

## 2015-12-19 ENCOUNTER — Encounter: Payer: Self-pay | Admitting: Emergency Medicine

## 2015-12-19 DIAGNOSIS — K8010 Calculus of gallbladder with chronic cholecystitis without obstruction: Principal | ICD-10-CM | POA: Insufficient documentation

## 2015-12-19 DIAGNOSIS — I10 Essential (primary) hypertension: Secondary | ICD-10-CM | POA: Insufficient documentation

## 2015-12-19 DIAGNOSIS — R52 Pain, unspecified: Secondary | ICD-10-CM | POA: Diagnosis present

## 2015-12-19 DIAGNOSIS — F419 Anxiety disorder, unspecified: Secondary | ICD-10-CM | POA: Insufficient documentation

## 2015-12-19 DIAGNOSIS — F1290 Cannabis use, unspecified, uncomplicated: Secondary | ICD-10-CM | POA: Diagnosis not present

## 2015-12-19 DIAGNOSIS — Z87891 Personal history of nicotine dependence: Secondary | ICD-10-CM | POA: Diagnosis not present

## 2015-12-19 DIAGNOSIS — F329 Major depressive disorder, single episode, unspecified: Secondary | ICD-10-CM | POA: Diagnosis not present

## 2015-12-19 DIAGNOSIS — K8020 Calculus of gallbladder without cholecystitis without obstruction: Secondary | ICD-10-CM | POA: Diagnosis present

## 2015-12-19 LAB — COMPREHENSIVE METABOLIC PANEL
ALK PHOS: 126 U/L (ref 38–126)
ALT: 22 U/L (ref 14–54)
AST: 23 U/L (ref 15–41)
Albumin: 4.4 g/dL (ref 3.5–5.0)
Anion gap: 8 (ref 5–15)
BUN: 18 mg/dL (ref 6–20)
CALCIUM: 9.8 mg/dL (ref 8.9–10.3)
CO2: 24 mmol/L (ref 22–32)
CREATININE: 0.67 mg/dL (ref 0.44–1.00)
Chloride: 107 mmol/L (ref 101–111)
Glucose, Bld: 116 mg/dL — ABNORMAL HIGH (ref 65–99)
Potassium: 3.4 mmol/L — ABNORMAL LOW (ref 3.5–5.1)
Sodium: 139 mmol/L (ref 135–145)
Total Bilirubin: 0.6 mg/dL (ref 0.3–1.2)
Total Protein: 8.2 g/dL — ABNORMAL HIGH (ref 6.5–8.1)

## 2015-12-19 LAB — CBC
HCT: 34.4 % — ABNORMAL LOW (ref 35.0–47.0)
Hemoglobin: 10.9 g/dL — ABNORMAL LOW (ref 12.0–16.0)
MCH: 24.2 pg — AB (ref 26.0–34.0)
MCHC: 31.7 g/dL — ABNORMAL LOW (ref 32.0–36.0)
MCV: 76.1 fL — ABNORMAL LOW (ref 80.0–100.0)
PLATELETS: 406 10*3/uL (ref 150–440)
RBC: 4.51 MIL/uL (ref 3.80–5.20)
RDW: 17 % — AB (ref 11.5–14.5)
WBC: 11.7 10*3/uL — AB (ref 3.6–11.0)

## 2015-12-19 LAB — LIPASE, BLOOD: Lipase: 25 U/L (ref 11–51)

## 2015-12-19 NOTE — ED Triage Notes (Signed)
Patient ambulatory to triage with steady gait, without difficulty or distress noted; pt sent by Dr Excell Seltzerooper for increased right sided abd pain and nausea today; known gallstones

## 2015-12-19 NOTE — Telephone Encounter (Signed)
I have called patient to go over her surgery date. No answer. I have left a detailed message.   Patient has an appointment with Dr Rosezetta SchlatterBurnette to discuss surgery on 12/20/15.

## 2015-12-20 ENCOUNTER — Ambulatory Visit: Admission: EM | Admit: 2015-12-20 | Payer: Managed Care, Other (non HMO) | Source: Ambulatory Visit | Admitting: Surgery

## 2015-12-20 ENCOUNTER — Observation Stay: Payer: Managed Care, Other (non HMO) | Admitting: Anesthesiology

## 2015-12-20 ENCOUNTER — Encounter
Admission: EM | Disposition: A | Discharge status: Home / Self Care | Payer: Self-pay | Source: Home / Self Care | Attending: Emergency Medicine

## 2015-12-20 ENCOUNTER — Observation Stay
Admission: EM | Admit: 2015-12-20 | Discharge: 2015-12-21 | Disposition: A | Discharge status: Home / Self Care | Payer: Managed Care, Other (non HMO) | Attending: Surgery | Admitting: Surgery

## 2015-12-20 ENCOUNTER — Ambulatory Visit: Payer: Self-pay | Admitting: General Surgery

## 2015-12-20 DIAGNOSIS — K8050 Calculus of bile duct without cholangitis or cholecystitis without obstruction: Secondary | ICD-10-CM

## 2015-12-20 DIAGNOSIS — R52 Pain, unspecified: Secondary | ICD-10-CM

## 2015-12-20 DIAGNOSIS — K8020 Calculus of gallbladder without cholecystitis without obstruction: Secondary | ICD-10-CM

## 2015-12-20 HISTORY — PX: CHOLECYSTECTOMY: SHX55

## 2015-12-20 LAB — URINALYSIS COMPLETE WITH MICROSCOPIC (ARMC ONLY)
BILIRUBIN URINE: NEGATIVE
Glucose, UA: NEGATIVE mg/dL
KETONES UR: NEGATIVE mg/dL
LEUKOCYTES UA: NEGATIVE
Nitrite: NEGATIVE
PH: 5 (ref 5.0–8.0)
PROTEIN: NEGATIVE mg/dL
SPECIFIC GRAVITY, URINE: 1.014 (ref 1.005–1.030)

## 2015-12-20 LAB — PREGNANCY, URINE: Preg Test, Ur: NEGATIVE

## 2015-12-20 LAB — SURGICAL PCR SCREEN
MRSA, PCR: NEGATIVE
Staphylococcus aureus: NEGATIVE

## 2015-12-20 SURGERY — LAPAROSCOPIC CHOLECYSTECTOMY
Anesthesia: General

## 2015-12-20 MED ORDER — ACETAMINOPHEN 10 MG/ML IV SOLN
INTRAVENOUS | Status: AC
  Filled 2015-12-20: qty 100

## 2015-12-20 MED ORDER — ONDANSETRON HCL 4 MG/2ML IJ SOLN
INTRAMUSCULAR | Status: DC | PRN
  Administered 2015-12-20: 4 mg via INTRAVENOUS

## 2015-12-20 MED ORDER — ACETAMINOPHEN 10 MG/ML IV SOLN
INTRAVENOUS | Status: DC | PRN
  Administered 2015-12-20: 1000 mg via INTRAVENOUS

## 2015-12-20 MED ORDER — FENTANYL CITRATE (PF) 100 MCG/2ML IJ SOLN
INTRAMUSCULAR | Status: AC
  Filled 2015-12-20: qty 2

## 2015-12-20 MED ORDER — PROPOFOL 10 MG/ML IV BOLUS
INTRAVENOUS | Status: DC | PRN
  Administered 2015-12-20: 170 mg via INTRAVENOUS

## 2015-12-20 MED ORDER — SUCCINYLCHOLINE CHLORIDE 20 MG/ML IJ SOLN
INTRAMUSCULAR | Status: DC | PRN
  Administered 2015-12-20: 100 mg via INTRAVENOUS

## 2015-12-20 MED ORDER — ONDANSETRON HCL 4 MG PO TABS: 4.0000 mg | ORAL_TABLET | Freq: Four times a day (QID) | ORAL | Status: DC | PRN

## 2015-12-20 MED ORDER — MORPHINE SULFATE (PF) 2 MG/ML IV SOLN
2.0000 mg | INTRAVENOUS | Status: DC | PRN
  Administered 2015-12-20 (×2): 2 mg via INTRAVENOUS
  Filled 2015-12-20 (×2): qty 1

## 2015-12-20 MED ORDER — CLINDAMYCIN PHOSPHATE 900 MG/50ML IV SOLN: 900.0000 mg | Freq: Once | INTRAVENOUS | Status: DC

## 2015-12-20 MED ORDER — ROCURONIUM BROMIDE 100 MG/10ML IV SOLN
INTRAVENOUS | Status: DC | PRN
  Administered 2015-12-20: 30 mg via INTRAVENOUS

## 2015-12-20 MED ORDER — ONDANSETRON HCL 4 MG/2ML IJ SOLN: 4.0000 mg | Freq: Four times a day (QID) | INTRAMUSCULAR | Status: DC | PRN

## 2015-12-20 MED ORDER — SUGAMMADEX SODIUM 200 MG/2ML IV SOLN
INTRAVENOUS | Status: DC | PRN
  Administered 2015-12-20: 181.4 mg via INTRAVENOUS

## 2015-12-20 MED ORDER — MIDAZOLAM HCL 2 MG/2ML IJ SOLN
INTRAMUSCULAR | Status: DC | PRN
  Administered 2015-12-20: 2 mg via INTRAVENOUS

## 2015-12-20 MED ORDER — ONDANSETRON HCL 4 MG/2ML IJ SOLN: 4.0000 mg | Freq: Once | INTRAMUSCULAR | Status: DC | PRN

## 2015-12-20 MED ORDER — KETOROLAC TROMETHAMINE 30 MG/ML IJ SOLN
30.0000 mg | Freq: Four times a day (QID) | INTRAMUSCULAR | Status: DC
  Administered 2015-12-20 – 2015-12-21 (×3): 30 mg via INTRAVENOUS
  Filled 2015-12-20 (×3): qty 1

## 2015-12-20 MED ORDER — DEXTROSE IN LACTATED RINGERS 5 % IV SOLN
INTRAVENOUS | Status: DC
  Administered 2015-12-20 (×3): via INTRAVENOUS
  Filled 2015-12-20 (×2): qty 1000

## 2015-12-20 MED ORDER — LIDOCAINE HCL (CARDIAC) 20 MG/ML IV SOLN
INTRAVENOUS | Status: DC | PRN
  Administered 2015-12-20: 50 mg via INTRAVENOUS

## 2015-12-20 MED ORDER — DIPHENHYDRAMINE HCL 50 MG/ML IJ SOLN
25.0000 mg | Freq: Once | INTRAMUSCULAR | Status: AC
  Administered 2015-12-20: 25 mg via INTRAVENOUS

## 2015-12-20 MED ORDER — LACTATED RINGERS IV SOLN
INTRAVENOUS | Status: DC | PRN
  Administered 2015-12-20: 18:00:00 via INTRAVENOUS

## 2015-12-20 MED ORDER — DIPHENHYDRAMINE HCL 50 MG/ML IJ SOLN
INTRAMUSCULAR | Status: AC
  Administered 2015-12-20: 25 mg via INTRAVENOUS
  Filled 2015-12-20: qty 1

## 2015-12-20 MED ORDER — DEXAMETHASONE SODIUM PHOSPHATE 10 MG/ML IJ SOLN
INTRAMUSCULAR | Status: DC | PRN
  Administered 2015-12-20: 10 mg via INTRAVENOUS

## 2015-12-20 MED ORDER — HEPARIN SODIUM (PORCINE) 5000 UNIT/ML IJ SOLN
5000.0000 [IU] | Freq: Three times a day (TID) | INTRAMUSCULAR | Status: DC
  Administered 2015-12-20 – 2015-12-21 (×3): 5000 [IU] via SUBCUTANEOUS
  Filled 2015-12-20 (×3): qty 1

## 2015-12-20 MED ORDER — CIPROFLOXACIN IN D5W 400 MG/200ML IV SOLN
400.0000 mg | Freq: Two times a day (BID) | INTRAVENOUS | Status: DC
  Administered 2015-12-20: 400 mg via INTRAVENOUS
  Filled 2015-12-20: qty 200

## 2015-12-20 MED ORDER — FENTANYL CITRATE (PF) 100 MCG/2ML IJ SOLN
25.0000 ug | INTRAMUSCULAR | Status: AC | PRN
  Administered 2015-12-20 (×6): 25 ug via INTRAVENOUS

## 2015-12-20 MED ORDER — KETOROLAC TROMETHAMINE 30 MG/ML IJ SOLN
INTRAMUSCULAR | Status: DC | PRN
  Administered 2015-12-20: 30 mg via INTRAVENOUS

## 2015-12-20 MED ORDER — FENTANYL CITRATE (PF) 100 MCG/2ML IJ SOLN
INTRAMUSCULAR | Status: DC | PRN
  Administered 2015-12-20: 50 ug via INTRAVENOUS
  Administered 2015-12-20: 100 ug via INTRAVENOUS
  Administered 2015-12-20 (×2): 50 ug via INTRAVENOUS

## 2015-12-20 MED ORDER — CLINDAMYCIN PHOSPHATE 900 MG/50ML IV SOLN
INTRAVENOUS | Status: DC | PRN
  Administered 2015-12-20: 900 mg via INTRAVENOUS

## 2015-12-20 MED ORDER — OXYCODONE-ACETAMINOPHEN 7.5-325 MG PO TABS
2.0000 | ORAL_TABLET | ORAL | Status: DC | PRN
  Administered 2015-12-21: 2 via ORAL
  Filled 2015-12-20: qty 2

## 2015-12-20 SURGICAL SUPPLY — 44 items
APPLICATOR COTTON TIP 6IN STRL (MISCELLANEOUS) IMPLANT
APPLIER CLIP 5 13 M/L LIGAMAX5 (MISCELLANEOUS) ×3
BLADE SURG 15 STRL LF DISP TIS (BLADE) ×1 IMPLANT
BLADE SURG 15 STRL SS (BLADE) ×2
CANISTER SUCT 1200ML W/VALVE (MISCELLANEOUS) ×3 IMPLANT
CHLORAPREP W/TINT 26ML (MISCELLANEOUS) ×3 IMPLANT
CHOLANGIOGRAM CATH TAUT (CATHETERS) IMPLANT
CLEANER CAUTERY TIP 5X5 PAD (MISCELLANEOUS) ×1 IMPLANT
CLIP APPLIE 5 13 M/L LIGAMAX5 (MISCELLANEOUS) ×1 IMPLANT
DECANTER SPIKE VIAL GLASS SM (MISCELLANEOUS) ×6 IMPLANT
DEVICE TROCAR PUNCTURE CLOSURE (ENDOMECHANICALS) IMPLANT
DRAPE C-ARM XRAY 36X54 (DRAPES) IMPLANT
ELECT REM PT RETURN 9FT ADLT (ELECTROSURGICAL) ×3
ELECTRODE REM PT RTRN 9FT ADLT (ELECTROSURGICAL) ×1 IMPLANT
ENDOPOUCH RETRIEVER 10 (MISCELLANEOUS) ×3 IMPLANT
GLOVE BIO SURGEON STRL SZ7 (GLOVE) ×3 IMPLANT
GOWN STRL REUS W/ TWL LRG LVL3 (GOWN DISPOSABLE) ×3 IMPLANT
GOWN STRL REUS W/TWL LRG LVL3 (GOWN DISPOSABLE) ×6
IRRIGATION STRYKERFLOW (MISCELLANEOUS) ×1 IMPLANT
IRRIGATOR STRYKERFLOW (MISCELLANEOUS) ×3
IV CATH ANGIO 12GX3 LT BLUE (NEEDLE) ×3 IMPLANT
IV NS 1000ML (IV SOLUTION) ×2
IV NS 1000ML BAXH (IV SOLUTION) ×1 IMPLANT
L-HOOK LAP DISP 36CM (ELECTROSURGICAL) ×3
LHOOK LAP DISP 36CM (ELECTROSURGICAL) ×1 IMPLANT
LIQUID BAND (GAUZE/BANDAGES/DRESSINGS) ×3 IMPLANT
NEEDLE HYPO 22GX1.5 SAFETY (NEEDLE) ×3 IMPLANT
PACK LAP CHOLECYSTECTOMY (MISCELLANEOUS) ×3 IMPLANT
PAD CLEANER CAUTERY TIP 5X5 (MISCELLANEOUS) ×2
PENCIL ELECTRO HAND CTR (MISCELLANEOUS) ×3 IMPLANT
SCISSORS METZENBAUM CVD 33 (INSTRUMENTS) ×3 IMPLANT
SLEEVE ENDOPATH XCEL 5M (ENDOMECHANICALS) ×6 IMPLANT
SOL ANTI-FOG 6CC FOG-OUT (MISCELLANEOUS) ×1 IMPLANT
SOL FOG-OUT ANTI-FOG 6CC (MISCELLANEOUS) ×2
STOPCOCK 3 WAY  REPLAC (MISCELLANEOUS) IMPLANT
SUT ETHIBOND 0 MO6 C/R (SUTURE) IMPLANT
SUT MNCRL AB 4-0 PS2 18 (SUTURE) ×3 IMPLANT
SUT VIC AB 0 CT2 27 (SUTURE) IMPLANT
SUT VICRYL 0 AB UR-6 (SUTURE) ×6 IMPLANT
SYR 20CC LL (SYRINGE) ×3 IMPLANT
TROCAR XCEL BLUNT TIP 100MML (ENDOMECHANICALS) ×3 IMPLANT
TROCAR XCEL NON-BLD 5MMX100MML (ENDOMECHANICALS) ×3 IMPLANT
TUBING INSUFFLATOR HI FLOW (MISCELLANEOUS) ×3 IMPLANT
WATER STERILE IRR 1000ML POUR (IV SOLUTION) ×3 IMPLANT

## 2015-12-20 NOTE — Progress Notes (Signed)
Laparoscopic Cholecystectomy  Pre-operative Diagnosis: Recurrent Biliary colic  Post-operative Diagnosis: Same  Procedure: Laparoscopic cholecystectomy  Surgeon: Sterling Bigiego Vic Esco, MD FACS  Anesthesia: Gen. with endotracheal tube   Findings: GS  Estimated Blood Loss: 5cc              Specimens: Gallbladder           Complications: none   Procedure Details  The patient was seen again in the Holding Room. The benefits, complications, treatment options, and expected outcomes were discussed with the patient. The risks of bleeding, infection, recurrence of symptoms, failure to resolve symptoms, bile duct damage, bile duct leak, retained common bile duct stone, bowel injury, any of which could require further surgery and/or ERCP, stent, or papillotomy were reviewed with the patient. The likelihood of improving the patient's symptoms with return to their baseline status is good.  The patient and/or family concurred with the proposed plan, giving informed consent.  The patient was taken to Operating Room, identified as Leslie Dalesiffany J Kurihara and the procedure verified as Laparoscopic Cholecystectomy.  A Time Out was held and the above information confirmed.  Prior to the induction of general anesthesia, antibiotic prophylaxis was administered. VTE prophylaxis was in place. General endotracheal anesthesia was then administered and tolerated well. After the induction, the abdomen was prepped with Chloraprep and draped in the sterile fashion. The patient was positioned in the supine position.  Local anesthetic  was injected into the skin near the umbilicus and an incision made. Cut down technique was used to enter the abdominal cavity and a Hasson trochar was placed after two vicryl stitches were anchored to the fascia. Pneumoperitoneum was then created with CO2 and tolerated well without any adverse changes in the patient's vital signs.  Three 5-mm ports were placed in the right upper quadrant all under direct  vision. All skin incisions  were infiltrated with a local anesthetic agent before making the incision and placing the trocars.   The patient was positioned  in reverse Trendelenburg, tilted slightly to the patient's left.  The gallbladder was identified, the fundus grasped and retracted cephalad. Adhesions were lysed bluntly. The infundibulum was grasped and retracted laterally, exposing the peritoneum overlying the triangle of Calot. This was then divided and exposed in a blunt fashion. An extended critical view of the cystic duct and cystic artery was obtained.  The cystic duct was clearly identified and bluntly dissected.   Artery and duct were double clipped and divided.  The gallbladder was taken from the gallbladder fossa in a retrograde fashion with the electrocautery. The gallbladder was removed and placed in an Endocatch bag. The liver bed was irrigated and inspected. Hemostasis was achieved with the electrocautery. Copious irrigation was utilized and was repeatedly aspirated until clear.  The gallbladder and Endocatch sac were then removed through the umbilical port site.   Inspection of the right upper quadrant was performed. No bleeding, bile duct injury or leak, or bowel injury was noted. Pneumoperitoneum was released.  The periumbilical port site was closed with figure-of-eight 0 Vicryl sutures. 4-0 subcuticular Monocryl was used to close the skin. Dermabond was  applied.  The patient was then extubated and brought to the recovery room in stable condition. Sponge, lap, and needle counts were correct at closure and at the conclusion of the case.               Sterling Bigiego Porshia Blizzard, MD, FACS

## 2015-12-20 NOTE — Progress Notes (Signed)
Pt seen and examined for lap chole today, chronic cholecystitis. The risks, benefits, complications, treatment options, and expected outcomes were discussed with the patient. The possibilities of bleeding, recurrent infection, finding a normal gallbladder, perforation of viscus organs, damage to surrounding structures, bile leak, abscess formation, needing a drain placed, the need for additional procedures, reaction to medication, pulmonary aspiration,  failure to diagnose a condition, the possible need to convert to an open procedure, and creating a complication requiring transfusion or operation were discussed with the patient. The patient and/or family concurred with the proposed plan, giving informed consent.

## 2015-12-20 NOTE — Anesthesia Preprocedure Evaluation (Signed)
Anesthesia Evaluation  Patient identified by MRN, date of birth, ID band Patient awake    Reviewed: Allergy & Precautions, H&P , NPO status , Patient's Chart, lab work & pertinent test results, reviewed documented beta blocker date and time   Airway Mallampati: II  TM Distance: >3 FB Neck ROM: full    Dental  (+) Teeth Intact, Chipped,    Pulmonary neg pulmonary ROS, former smoker,    Pulmonary exam normal        Cardiovascular hypertension, negative cardio ROS Normal cardiovascular exam Rhythm:regular Rate:Normal     Neuro/Psych negative neurological ROS  negative psych ROS   GI/Hepatic negative GI ROS, Neg liver ROS,   Endo/Other  negative endocrine ROS  Renal/GU negative Renal ROS  negative genitourinary   Musculoskeletal   Abdominal   Peds  Hematology negative hematology ROS (+) anemia ,   Anesthesia Other Findings Past Medical History: No date: Anxiety No date: Depression No date: History of migraine headaches No date: Hx of chlamydia infection No date: Hypertension No date: Pregnancy induced hypertension No date: UTI (lower urinary tract infection)     Comment: RESOLVED Past Surgical History: No date: OTHER SURGICAL HISTORY     Comment: tubes ears 18 months 11/03/2015: PERINEAL LACERATION REPAIR N/A     Comment: Procedure: SUTURE REPAIR PERINEAL LACERATION;               Surgeon: Hildred LaserAnika Cherry, MD;  Location: [facility] ORS;              Service: Gynecology;  Laterality: N/A; No date: tubes in ears Bilateral BMI    Body Mass Index:  30.41 kg/m     Reproductive/Obstetrics negative OB ROS                            Anesthesia Physical Anesthesia Plan  ASA: II and emergent  Anesthesia Plan: General ETT   Post-op Pain Management:    Induction: Intravenous, Cricoid pressure planned and Rapid sequence  Airway Management Planned: Oral ETT  Additional Equipment:   Intra-op  Plan:   Post-operative Plan:   Informed Consent: I have reviewed the patients History and Physical, chart, labs and discussed the procedure including the risks, benefits and alternatives for the proposed anesthesia with the patient or authorized representative who has indicated his/her understanding and acceptance.   Dental Advisory Given  Plan Discussed with: CRNA  Anesthesia Plan Comments:        Anesthesia Quick Evaluation

## 2015-12-20 NOTE — H&P (Signed)
Shelley Graham is an 20 y.o. female.    Chief Complaint: Right upper quadrant pain  HPI: The patient seen in the office last week with a history of recurrent episodic right upper quadrant pain associated fatty food intolerance and workup showing gallstones and likely biliary colic is causing her symptoms. She was offered multiple different dates for surgery but due to her new baby and her mother's inability to obtain time off from work in a timely fashion she did not schedule surgery until later in the month. She has had recurrent and episodic pain and called earlier today. I spoke to her mother on the telephone and gave her the options and she preferred to come into the hospital today for semiurgent surgery pending OR time availability.  Past Medical History:  Diagnosis Date  . Anxiety   . Depression   . History of migraine headaches   . Hx of chlamydia infection   . Hypertension   . Pregnancy induced hypertension   . UTI (lower urinary tract infection)    RESOLVED    Past Surgical History:  Procedure Laterality Date  . OTHER SURGICAL HISTORY     tubes ears 18 months  . PERINEAL LACERATION REPAIR N/A 11/03/2015   Procedure: SUTURE REPAIR PERINEAL LACERATION;  Surgeon: Rubie Maid, MD;  Location: [facility] ORS;  Service: Gynecology;  Laterality: N/A;  . tubes in ears Bilateral     Family History  Problem Relation Age of Onset  . Asthma Mother     as child  . Migraines Mother   . Thyroid disease Mother   . Cancer Father   . COPD Father   . Cancer Maternal Grandmother     colon  . Cancer Paternal Grandfather     lung  . Asthma Brother    Social History:  reports that she quit smoking about 9 months ago. Her smoking use included Cigarettes. She has a 1.50 pack-year smoking history. She has never used smokeless tobacco. She reports that she uses drugs, including Benzodiazepines and Marijuana. She reports that she does not drink alcohol.  Allergies:  Allergies  Allergen Reactions   . Ceftriaxone Sodium In Dextrose Rash    Reaction occurred immediately after starting IV Ceftriaxone and medication was stopped directly after symptoms began.      (Not in a hospital admission)   Review of Systems  Constitutional: Negative for chills, fever and weight loss.  HENT: Negative.   Eyes: Negative.   Respiratory: Negative.   Cardiovascular: Negative.   Gastrointestinal: Positive for abdominal pain and nausea. Negative for blood in stool, constipation, diarrhea, heartburn, melena and vomiting.  Genitourinary: Negative.   Musculoskeletal: Negative.   Skin: Negative.   Neurological: Negative.   Endo/Heme/Allergies: Negative.   Psychiatric/Behavioral: Negative.      Physical Exam:  BP 129/77 (BP Location: Left Arm)   Pulse 92   Temp 98.5 F (36.9 C) (Oral)   Resp 18   Ht '5\' 8"'  (1.727 m)   Wt 200 lb (90.7 kg)   LMP 12/19/2015 (Exact Date)   SpO2 99%   BMI 30.41 kg/m   Physical Exam  Constitutional: She is oriented to person, place, and time and well-developed, well-nourished, and in no distress. No distress.  Obese female patient appears comfortable moves around in the bed without difficulty  HENT:  Head: Normocephalic and atraumatic.  Eyes: Pupils are equal, round, and reactive to light. Right eye exhibits no discharge. Left eye exhibits no discharge. No scleral icterus.  Neck: Normal  range of motion.  Cardiovascular: Normal rate, regular rhythm and normal heart sounds.   Pulmonary/Chest: Effort normal and breath sounds normal. No respiratory distress. She has no wheezes. She has no rales.  Abdominal: Soft. She exhibits no distension. There is no tenderness. There is no rebound and no guarding.  No right upper quadrant tenderness to speak of and negative Murphy sign  Musculoskeletal: Normal range of motion. She exhibits no edema, tenderness or deformity.  Lymphadenopathy:    She has no cervical adenopathy.  Neurological: She is alert and oriented to person,  place, and time.  Skin: Skin is warm and dry. She is not diaphoretic. No erythema.  Multiple tattoos  Psychiatric: Mood and affect normal.  Vitals reviewed.       Results for orders placed or performed during the hospital encounter of 12/20/15 (from the past 48 hour(s))  Lipase, blood     Status: None   Collection Time: 12/19/15 10:41 PM  Result Value Ref Range   Lipase 25 11 - 51 U/L  Comprehensive metabolic panel     Status: Abnormal   Collection Time: 12/19/15 10:41 PM  Result Value Ref Range   Sodium 139 135 - 145 mmol/L   Potassium 3.4 (L) 3.5 - 5.1 mmol/L   Chloride 107 101 - 111 mmol/L   CO2 24 22 - 32 mmol/L   Glucose, Bld 116 (H) 65 - 99 mg/dL   BUN 18 6 - 20 mg/dL   Creatinine, Ser 0.67 0.44 - 1.00 mg/dL   Calcium 9.8 8.9 - 10.3 mg/dL   Total Protein 8.2 (H) 6.5 - 8.1 g/dL   Albumin 4.4 3.5 - 5.0 g/dL   AST 23 15 - 41 U/L   ALT 22 14 - 54 U/L   Alkaline Phosphatase 126 38 - 126 U/L   Total Bilirubin 0.6 0.3 - 1.2 mg/dL   GFR calc non Af Amer >60 >60 mL/min   GFR calc Af Amer >60 >60 mL/min    Comment: (NOTE) The eGFR has been calculated using the CKD EPI equation. This calculation has not been validated in all clinical situations. eGFR's persistently <60 mL/min signify possible Chronic Kidney Disease.    Anion gap 8 5 - 15  CBC     Status: Abnormal   Collection Time: 12/19/15 10:41 PM  Result Value Ref Range   WBC 11.7 (H) 3.6 - 11.0 K/uL   RBC 4.51 3.80 - 5.20 MIL/uL   Hemoglobin 10.9 (L) 12.0 - 16.0 g/dL   HCT 34.4 (L) 35.0 - 47.0 %   MCV 76.1 (L) 80.0 - 100.0 fL   MCH 24.2 (L) 26.0 - 34.0 pg   MCHC 31.7 (L) 32.0 - 36.0 g/dL   RDW 17.0 (H) 11.5 - 14.5 %   Platelets 406 150 - 440 K/uL   US Abdomen Limited Ruq  Result Date: 12/19/2015 CLINICAL DATA:  Abdominal pain for 2-3 weeks EXAM: US ABDOMEN LIMITED - RIGHT UPPER QUADRANT COMPARISON:  12/04/2015 FINDINGS: Gallbladder: Mobile cholelithiasis. No wall thickening or focal tenderness to suggest acute  cholecystitis. Common bile duct: Diameter: 3 mm Liver: No focal lesion identified. Within normal limits in parenchymal echogenicity. Antegrade flow in the imaged portal venous system. IMPRESSION: Cholelithiasis without signs of cholecystitis. Electronically Signed   By: Monte Fantasia M.D.   On: 12/19/2015 23:39     Assessment/Plan  The studies are reviewed today. There is no sign of elevated liver cyst function test to suggest choledocholithiasis her white blood cell count is minimally  elevated she has not been vomiting.  I discussed with her mother on the telephone and then with the patient and mother in the emergency room as well as with Dr. Owens Shark in the ED that this patient's symptoms are likely related to biliary colic she does not show signs of acute cholecystitis. She had been offered multiple opportunities to schedule surgery but chose to schedule it later next month due to grandmother's difficulty with babysitting. She was not able to get time off until later in the month.  She called today stating that she was having another attack was not vomiting but was having considerable pain and wanted to come into the hospital and possibly have surgery sooner. I described for the patient the nonurgent nature of her condition and the fact that she does not show signs of cholecystitis. I also suggested that because of her pain control issues and nausea we can bring her into the hospital hydrated start IV antibiotics and proceed with surgery once OR time was available but that OR time for a nonemergent condition is not guaranteed. I discussed with them the rationale for this approach and the options of observation or outpatient management and the risks of bleeding infection conversion to an open procedure bile duct damage bile duct leak retained common bile duct stone any of which could require further surgery and/or ERCP stent papillotomy. Of note she did have elevated liver function tests mildly at one  point in her prior visits to the emergency room but at this point there are normal. I will discuss this patient's care with Dr. Dahlia Byes and if he is available in or time is available she can have surgery later today or the next day. No promises were made as to scheduling based on her nonemergent condition and or time availability. They understood and agreed with this plan  Florene Glen, MD, FACS

## 2015-12-20 NOTE — Anesthesia Procedure Notes (Signed)
Procedure Name: Intubation Performed by: Arvel Oquinn Pre-anesthesia Checklist: Patient identified, Patient being monitored, Timeout performed, Emergency Drugs available and Suction available Patient Re-evaluated:Patient Re-evaluated prior to inductionOxygen Delivery Method: Circle system utilized Preoxygenation: Pre-oxygenation with 100% oxygen Intubation Type: IV induction, Rapid sequence and Cricoid Pressure applied Laryngoscope Size: Miller and 2 Grade View: Grade I Tube type: Oral Tube size: 7.0 mm Number of attempts: 1 Airway Equipment and Method: Stylet Placement Confirmation: ETT inserted through vocal cords under direct vision,  positive ETCO2 and breath sounds checked- equal and bilateral Secured at: 21 cm Tube secured with: Tape Dental Injury: Teeth and Oropharynx as per pre-operative assessment  Comments: OP clear        

## 2015-12-20 NOTE — ED Provider Notes (Signed)
[facility] Emergency Department Provider Note  ____________________________________________   First MD Initiated Contact with Patient 12/20/15 [phone]     (approximate)  I have reviewed the triage vital signs and the nursing notes.   HISTORY  Chief Complaint Abdominal Pain   HPI Shelley Graham Mood is a 20 y.o. female  with history of gallstones presents referred to the emergency department by Dr. Excell Seltzerooper for recurrence of right upper quadrant pain today. Patient states her current pain score 7 out of 10. Patient admits to nausea no vomiting at present. Patient denies any fever.   Past Medical History:  Diagnosis Date  . Anxiety   . Depression   . History of migraine headaches   . Hx of chlamydia infection   . Hypertension   . Pregnancy induced hypertension   . UTI (lower urinary tract infection)    RESOLVED    Patient Active Problem List   Diagnosis Date Noted  . Acute posthemorrhagic anemia 11/07/2015  . Gestational hypertension 11/01/2015  . Obesity (BMI 30-39.9) 09/27/2015  . Gallstones and inflammation of gallbladder without obstruction 05/25/2015  . Marijuana use, episodic 05/25/2015  . GBS bacteriuria 03/14/2015  . Maternal varicella, non-immune 03/10/2015  . Rubella non-immune status, antepartum 03/10/2015  . History of migraine headaches   . Anxiety   . Depression     Past Surgical History:  Procedure Laterality Date  . OTHER SURGICAL HISTORY     tubes ears 18 months  . PERINEAL LACERATION REPAIR N/A 11/03/2015   Procedure: SUTURE REPAIR PERINEAL LACERATION;  Surgeon: Hildred LaserAnika Cherry, MD;  Location: [facility] ORS;  Service: Gynecology;  Laterality: N/A;  . tubes in ears Bilateral     Prior to Admission medications   Medication Sig Start Date End Date Taking? Authorizing Provider  HYDROcodone-acetaminophen (NORCO/VICODIN) 5-325 MG tablet Take 1 tablet by mouth every 4 (four) hours as needed for moderate pain. 12/13/15   Lattie Hawichard E Cooper, MD     Allergies Ceftriaxone sodium in dextrose  Family History  Problem Relation Age of Onset  . Asthma Mother     as child  . Migraines Mother   . Thyroid disease Mother   . Cancer Father   . COPD Father   . Cancer Maternal Grandmother     colon  . Cancer Paternal Grandfather     lung  . Asthma Brother     Social History Social History  Substance Use Topics  . Smoking status: Former Smoker    Packs/day: 0.25    Years: 6.00    Types: Cigarettes    Quit date: 03/15/2015  . Smokeless tobacco: Never Used  . Alcohol use No    Review of Systems Constitutional: No fever/chills Eyes: No visual changes. ENT: No sore throat. Cardiovascular: Denies chest pain. Respiratory: Denies shortness of breath. Gastrointestinal: Positive for abdominal pain and nausea no vomiting.  No diarrhea.  No constipation. Genitourinary: Negative for dysuria. Musculoskeletal: Negative for back pain. Skin: Negative for rash. Neurological: Negative for headaches, focal weakness or numbness.  10-point ROS otherwise negative.  ____________________________________________   PHYSICAL EXAM:  VITAL SIGNS: ED Triage Vitals  Enc Vitals Group     BP 12/19/15 2239 129/77     Pulse Rate 12/19/15 2239 92     Resp 12/19/15 2239 18     Temp 12/19/15 2239 98.5 F (36.9 C)     Temp Source 12/19/15 2239 Oral     SpO2 12/19/15 2239 99 %     Weight 12/19/15  2238 200 lb (90.7 kg)     Height 12/19/15 2238 5\' 8"  (1.727 m)     Head Circumference --      Peak Flow --      Pain Score 12/19/15 2238 8     Pain Loc --      Pain Edu? --      Excl. in GC? --     Constitutional: Alert and oriented. Well appearing and in no acute distress. Eyes: Conjunctivae are normal. PERRL. EOMI. Head: Atraumatic. Mouth/Throat: Mucous membranes are moist.  Oropharynx non-erythematous. Neck: No stridor.  No meningeal signs.   Cardiovascular: Normal rate, regular rhythm. Good peripheral circulation. Grossly normal heart  sounds. Respiratory: Normal respiratory effort.  No retractions. Lungs CTAB. Gastrointestinal: Right upper quadrant tenderness to palpation. No distention.  Musculoskeletal: No lower extremity tenderness nor edema. No gross deformities of extremities. Neurologic:  Normal speech and language. No gross focal neurologic deficits are appreciated.  Skin:  Skin is warm, dry and intact. No rash noted.   ____________________________________________   LABS (all labs ordered are listed, but only abnormal results are displayed)  Labs Reviewed  COMPREHENSIVE METABOLIC PANEL - Abnormal; Notable for the following:       Result Value   Potassium 3.4 (*)    Glucose, Bld 116 (*)    Total Protein 8.2 (*)    All other components within normal limits  CBC - Abnormal; Notable for the following:    WBC 11.7 (*)    Hemoglobin 10.9 (*)    HCT 34.4 (*)    MCV 76.1 (*)    MCH 24.2 (*)    MCHC 31.7 (*)    RDW 17.0 (*)    All other components within normal limits  LIPASE, BLOOD  URINALYSIS COMPLETEWITH MICROSCOPIC ([facility] ONLY)   ___________________________________ RADIOLOGY I, Bluford Dewayne ShorterN BROWN, personally viewed and evaluated these images (plain radiographs) as part of my medical decision making, as well as reviewing the written report by the radiologist.  [place] Abdomen Limited Ruq  Result Date: 12/19/2015 CLINICAL DATA:  Abdominal pain for 2-3 weeks EXAM: US ABDOMEN LIMITED - RIGHT UPPER QUADRANT COMPARISON:  12/04/2015 FINDINGS: Gallbladder: Mobile cholelithiasis. No wall thickening or focal tenderness to suggest acute cholecystitis. Common bile duct: Diameter: 3 mm Liver: No focal lesion identified. Within normal limits in parenchymal echogenicity. Antegrade flow in the imaged portal venous system. IMPRESSION: Cholelithiasis without signs of cholecystitis. Electronically Signed   By: Marnee SpringJonathon  Watts M.D.   On: 12/19/2015 23:39      Procedures     INITIAL IMPRESSION / ASSESSMENT AND PLAN / ED  COURSE  Pertinent labs & imaging results that were available during my care of the patient were reviewed by me and considered in my medical decision making (see chart for details).  Patient discussed with Dr. Excell Seltzerooper general surgeon on call who will admit the patient for cholelithiasis with ongoing right upper quadrant pain.   Clinical Course    ____________________________________________  FINAL CLINICAL IMPRESSION(S) / ED DIAGNOSES  Final diagnoses:  Pain  Calculus of gallbladder without cholecystitis without obstruction     MEDICATIONS GIVEN DURING THIS VISIT:  Medications - No data to display   NEW OUTPATIENT MEDICATIONS STARTED DURING THIS VISIT:  New Prescriptions   No medications on file      Note:  This document was prepared using Dragon voice recognition software and may include unintentional dictation errors.    Darci Currentandolph N Brown, MD 12/20/15 [phone]

## 2015-12-20 NOTE — Transfer of Care (Signed)
Immediate Anesthesia Transfer of Care Note  Patient: Shelley Graham  Procedure(s) Performed: Procedure(s): LAPAROSCOPIC CHOLECYSTECTOMY (N/A)  Patient Location: PACU  Anesthesia Type:General  Level of Consciousness: awake, alert , oriented and patient cooperative  Airway & Oxygen Therapy: Patient Spontanous Breathing  Post-op Assessment: Report given to RN and Post -op Vital signs reviewed and stable  Post vital signs: Reviewed and stable  Last Vitals:  Vitals:   12/20/15 1258 12/20/15 1846  BP: (!) 109/52 (!) 111/98  Pulse: 66 (!) 102  Resp: 16 18  Temp: 36.6 C 36.5 C    Last Pain:  Vitals:   12/20/15 1342  TempSrc:   PainSc: 3          Complications: No apparent anesthesia complications

## 2015-12-20 NOTE — Telephone Encounter (Signed)
Surgery has been canceled with Dr Excell Seltzerooper. Patient was seen at [facility] ED. She is scheduled to have surgery as inpatient on 12/20/15 (today) with Dr Everlene FarrierPabon.

## 2015-12-20 NOTE — Op Note (Signed)
Laparoscopic Cholecystectomy  Pre-operative Diagnosis: Recurrent Biliary colic  Post-operative Diagnosis: Same  Procedure: Laparoscopic cholecystectomy  Surgeon: Tylee Newby, MD FACS  Anesthesia: Gen. with endotracheal tube   Findings: GS  Estimated Blood Loss: 5cc              Specimens: Gallbladder           Complications: none   Procedure Details  The patient was seen again in the Holding Room. The benefits, complications, treatment options, and expected outcomes were discussed with the patient. The risks of bleeding, infection, recurrence of symptoms, failure to resolve symptoms, bile duct damage, bile duct leak, retained common bile duct stone, bowel injury, any of which could require further surgery and/or ERCP, stent, or papillotomy were reviewed with the patient. The likelihood of improving the patient's symptoms with return to their baseline status is good.  The patient and/or family concurred with the proposed plan, giving informed consent.  The patient was taken to Operating Room, identified as Shelley Graham and the procedure verified as Laparoscopic Cholecystectomy.  A Time Out was held and the above information confirmed.  Prior to the induction of general anesthesia, antibiotic prophylaxis was administered. VTE prophylaxis was in place. General endotracheal anesthesia was then administered and tolerated well. After the induction, the abdomen was prepped with Chloraprep and draped in the sterile fashion. The patient was positioned in the supine position.  Local anesthetic  was injected into the skin near the umbilicus and an incision made. Cut down technique was used to enter the abdominal cavity and a Hasson trochar was placed after two vicryl stitches were anchored to the fascia. Pneumoperitoneum was then created with CO2 and tolerated well without any adverse changes in the patient's vital signs.  Three 5-mm ports were placed in the right upper quadrant all under direct  vision. All skin incisions  were infiltrated with a local anesthetic agent before making the incision and placing the trocars.   The patient was positioned  in reverse Trendelenburg, tilted slightly to the patient's left.  The gallbladder was identified, the fundus grasped and retracted cephalad. Adhesions were lysed bluntly. The infundibulum was grasped and retracted laterally, exposing the peritoneum overlying the triangle of Calot. This was then divided and exposed in a blunt fashion. An extended critical view of the cystic duct and cystic artery was obtained.  The cystic duct was clearly identified and bluntly dissected.   Artery and duct were double clipped and divided.  The gallbladder was taken from the gallbladder fossa in a retrograde fashion with the electrocautery. The gallbladder was removed and placed in an Endocatch bag. The liver bed was irrigated and inspected. Hemostasis was achieved with the electrocautery. Copious irrigation was utilized and was repeatedly aspirated until clear.  The gallbladder and Endocatch sac were then removed through the umbilical port site.   Inspection of the right upper quadrant was performed. No bleeding, bile duct injury or leak, or bowel injury was noted. Pneumoperitoneum was released.  The periumbilical port site was closed with figure-of-eight 0 Vicryl sutures. 4-0 subcuticular Monocryl was used to close the skin. Dermabond was  applied.  The patient was then extubated and brought to the recovery room in stable condition. Sponge, lap, and needle counts were correct at closure and at the conclusion of the case.               Nichelle Renwick, MD, FACS   

## 2015-12-20 NOTE — ED Notes (Signed)
Pt informed urine specimen needed, stats unable now but will try again.

## 2015-12-20 NOTE — ED Notes (Signed)
Pt breaks out in hives/raised rash at left arm, medication Stopped and Dr. Excell Seltzerooper notified. Dr. Excell Seltzerooper states to D/C the med and given 25mg  of Benadryl. Airway intact. Receiving RN Misty StanleyStacey made aware.

## 2015-12-21 ENCOUNTER — Encounter: Payer: Self-pay | Admitting: Surgery

## 2015-12-21 MED ORDER — OXYCODONE-ACETAMINOPHEN 7.5-325 MG PO TABS: 2.0000 | ORAL_TABLET | ORAL | 0 refills | Status: DC | PRN

## 2015-12-21 NOTE — Discharge Summary (Signed)
  Patient ID: Shelley Graham MRN: [id number] DOB/AGE: 21/09/1995 21 y.o.  Admit date: 12/20/2015 Discharge date: 12/21/2015   Discharge Diagnoses:  Active Problems:   Biliary colic   Procedures:lapchole  Hospital Course: Admitted for recurrent and persistent biliary colic, underwent uneventful pa chole and stayed overnight. At the time of DC she was taking PO, AVSS, ambulating. HER PE NAD, alert Abd: soft, Incisions c/d/i, no peritonitis or infection. Condition at DC is stable   Disposition: 01-Home or Self Care  Discharge Instructions    Call MD for:  difficulty breathing, headache or visual disturbances    Complete by:  As directed   Call MD for:  extreme fatigue    Complete by:  As directed   Call MD for:  hives    Complete by:  As directed   Call MD for:  persistant dizziness or light-headedness    Complete by:  As directed   Call MD for:  persistant nausea and vomiting    Complete by:  As directed   Call MD for:  redness, tenderness, or signs of infection (pain, swelling, redness, odor or green/yellow discharge around incision site)    Complete by:  As directed   Call MD for:  severe uncontrolled pain    Complete by:  As directed   Call MD for:  temperature >100.4    Complete by:  As directed   Diet - low sodium heart healthy    Complete by:  As directed   Discharge instructions    Complete by:  As directed   Shower In am   Increase activity slowly    Complete by:  As directed   Lifting restrictions    Complete by:  As directed   20 lbs x 6 weeks   No wound care    Complete by:  As directed       Medication List    TAKE these medications   HYDROcodone-acetaminophen 5-325 MG tablet Commonly known as:  NORCO/VICODIN Take 1 tablet by mouth every 4 (four) hours as needed for moderate pain.   oxyCODONE-acetaminophen 7.5-325 MG tablet Commonly known as:  PERCOCET Take 2 tablets by mouth every 4 (four) hours as needed for moderate pain.      Follow-up Information    Leafy Roiego F Shelley Valdivia, MD Follow up on 12/28/2015.   Specialty:  General Surgery Contact information: [address] [place] [address] [phone]            Sterling Bigiego Shelley Overholt, MD FACS

## 2015-12-21 NOTE — Progress Notes (Signed)
Pt discharged to home via transport with mother.  Discharge paperwork and follow-up instructions reviewed with patient.  Pt verbalized understanding of instructions

## 2015-12-22 LAB — SURGICAL PATHOLOGY

## 2015-12-25 NOTE — Anesthesia Postprocedure Evaluation (Signed)
Anesthesia Post Note  Patient: Shelley Graham  Procedure(s) Performed: Procedure(s) (LRB): LAPAROSCOPIC CHOLECYSTECTOMY (N/A)  Patient location during evaluation: PACU Anesthesia Type: General Level of consciousness: awake and alert Pain management: pain level controlled Vital Signs Assessment: post-procedure vital signs reviewed and stable Respiratory status: spontaneous breathing, nonlabored ventilation, respiratory function stable and patient connected to nasal cannula oxygen Cardiovascular status: blood pressure returned to baseline and stable Postop Assessment: no signs of nausea or vomiting Anesthetic complications: no    Last Vitals:  Vitals:   12/20/15 2128 12/21/15 0529  BP: 133/77 134/66  Pulse: 92 61  Resp: 20 20  Temp: 36.6 C 36.6 C    Last Pain:  Vitals:   12/21/15 0915  TempSrc:   PainSc: 7                  Yevette EdwardsJames G Zamia Tyminski

## 2015-12-28 ENCOUNTER — Encounter: Payer: Managed Care, Other (non HMO) | Admitting: Surgery

## 2015-12-30 ENCOUNTER — Ambulatory Visit (HOSPITAL_COMMUNITY)
Admission: RE | Admit: 2015-12-30 | Discharge: 2015-12-30 | Disposition: A | Discharge status: Home / Self Care | Payer: Managed Care, Other (non HMO) | Attending: Psychiatry | Admitting: Psychiatry

## 2015-12-30 DIAGNOSIS — I10 Essential (primary) hypertension: Secondary | ICD-10-CM | POA: Insufficient documentation

## 2015-12-30 DIAGNOSIS — F419 Anxiety disorder, unspecified: Secondary | ICD-10-CM | POA: Insufficient documentation

## 2015-12-30 DIAGNOSIS — F329 Major depressive disorder, single episode, unspecified: Secondary | ICD-10-CM | POA: Insufficient documentation

## 2015-12-30 DIAGNOSIS — G43909 Migraine, unspecified, not intractable, without status migrainosus: Secondary | ICD-10-CM | POA: Insufficient documentation
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
## 2015-12-30 NOTE — H&P (Signed)
Behavioral Health Medical Screening Exam  Shelley Graham is an 20 y.o. female.  Total Time spent with patient: 45 minutes  Psychiatric Specialty Exam: Physical Exam  Nursing note and vitals reviewed. Constitutional: She is oriented to person, place, and time. She appears well-developed.  HENT:  Head: Normocephalic.  Eyes: Pupils are equal, round, and reactive to light.  Neck: Normal range of motion.  Musculoskeletal: Normal range of motion.  Neurological: She is alert and oriented to person, place, and time.  Psychiatric: She has a normal mood and affect. Her behavior is normal. Judgment and thought content normal.    Review of Systems  Psychiatric/Behavioral: Positive for depression. Negative for hallucinations, memory loss, substance abuse and suicidal ideas. The patient is not nervous/anxious.   All other systems reviewed and are negative.   Last menstrual period 12/19/2015, not currently breastfeeding.There is no height or weight on file to calculate BMI.  General Appearance: Fairly Groomed  Eye Contact:  Fair  Speech:  Clear and Coherent and Normal Rate  Volume:  Normal  Mood:  Depressed  Affect:  Appropriate and Congruent  Thought Process:  Coherent and Goal Directed  Orientation:  Full (Time, Place, and Person)  Thought Content:  WDL  Suicidal Thoughts:  No  Homicidal Thoughts:  No  Memory:  Immediate;   Fair Recent;   Fair  Judgement:  Intact  Insight:  Good and Present  Psychomotor Activity:  Normal  Concentration: Concentration: Fair and Attention Span: Fair  Recall:  Good  Fund of Knowledge:Good  Language: Good  Akathisia:  No  Handed:  Right  AIMS (if indicated):     Assets:  Communication Skills Desire for Improvement Financial Resources/Insurance Housing Leisure Time Physical Health Resilience Social Support Talents/Skills Vocational/Educational  Sleep:       Musculoskeletal: Strength & Muscle Tone: within normal limits Gait & Station:  normal Patient leans: N/A  Last menstrual period 12/19/2015, not currently breastfeeding.  Recommendations: Outpatient disposition. Discussed with patient about postpartum depression, risks, and expectations. Reviewed sudden changes that may take place. Delivery was 2 months ago.   Based on my evaluation the patient does not appear to have an emergency medical condition.  [name], FNP 12/30/2015, 4:41 PM

## 2016-01-04 ENCOUNTER — Inpatient Hospital Stay: Admission: RE | Admit: 2016-01-04 | Payer: Managed Care, Other (non HMO) | Source: Ambulatory Visit

## 2016-01-10 ENCOUNTER — Encounter: Payer: Self-pay | Admitting: Emergency Medicine

## 2016-01-10 ENCOUNTER — Emergency Department
Admission: EM | Admit: 2016-01-10 | Discharge: 2016-01-10 | Disposition: A | Discharge status: Home / Self Care | Payer: Managed Care, Other (non HMO) | Attending: Student in an Organized Health Care Education/Training Program | Admitting: Student in an Organized Health Care Education/Training Program

## 2016-01-10 DIAGNOSIS — I10 Essential (primary) hypertension: Secondary | ICD-10-CM | POA: Diagnosis not present

## 2016-01-10 DIAGNOSIS — F1290 Cannabis use, unspecified, uncomplicated: Secondary | ICD-10-CM | POA: Diagnosis not present

## 2016-01-10 DIAGNOSIS — Z87891 Personal history of nicotine dependence: Secondary | ICD-10-CM | POA: Diagnosis not present

## 2016-01-10 DIAGNOSIS — F419 Anxiety disorder, unspecified: Secondary | ICD-10-CM | POA: Insufficient documentation

## 2016-01-10 DIAGNOSIS — Z791 Long term (current) use of non-steroidal anti-inflammatories (NSAID): Secondary | ICD-10-CM | POA: Insufficient documentation

## 2016-01-10 MED ORDER — LORAZEPAM 1 MG PO TABS
1.0000 mg | ORAL_TABLET | Freq: Once | ORAL | Status: AC
  Administered 2016-01-10: 1 mg via ORAL
  Filled 2016-01-10: qty 1
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
## 2016-01-10 NOTE — ED Provider Notes (Signed)
[facility] Emergency Department Provider Note    First MD Initiated Contact with Patient 01/10/16 1753     (approximate)  I have reviewed the triage vital signs and the nursing notes.   HISTORY  Chief Complaint Depression    HPI Shelley Graham is a 20 y.o. female history of anxiety presents with 2 months of worsening anxiety and mood. States that this is recently exacerbated by the recent birth of her child. Denies any SI or HI. Not on any anti-antidepressants or antianxiety medications.  Denies any hallucinations. She has been caring for her newborn.  States that she was previously on medium dose Zoloft for several years since high school but that didn't significantly help. Is that she is feeling more exhausted since the birth of her child. Has a good appetite. No thoughts of harming herself or the baby.   Past Medical History:  Diagnosis Date  . Anxiety   . Depression   . History of migraine headaches   . Hx of chlamydia infection   . Hypertension   . Pregnancy induced hypertension   . UTI (lower urinary tract infection)    RESOLVED    Patient Active Problem List   Diagnosis Date Noted  . Biliary colic 12/20/2015  . Acute posthemorrhagic anemia 11/07/2015  . Gestational hypertension 11/01/2015  . Obesity (BMI 30-39.9) 09/27/2015  . Gallstones and inflammation of gallbladder without obstruction 05/25/2015  . Marijuana use, episodic 05/25/2015  . GBS bacteriuria 03/14/2015  . Maternal varicella, non-immune 03/10/2015  . Rubella non-immune status, antepartum 03/10/2015  . History of migraine headaches   . Anxiety   . Depression     Past Surgical History:  Procedure Laterality Date  . CHOLECYSTECTOMY N/A 12/20/2015   Procedure: LAPAROSCOPIC CHOLECYSTECTOMY;  Surgeon: Leafy Ro, MD;  Location: [facility] ORS;  Service: General;  Laterality: N/A;  . OTHER SURGICAL HISTORY     tubes ears 18 months  . PERINEAL LACERATION REPAIR N/A 11/03/2015   Procedure: SUTURE REPAIR PERINEAL LACERATION;  Surgeon: Hildred Laser, MD;  Location: [facility] ORS;  Service: Gynecology;  Laterality: N/A;  . tubes in ears Bilateral     Prior to Admission medications   Medication Sig Start Date End Date Taking? Authorizing Provider  HYDROcodone-acetaminophen (NORCO/VICODIN) 5-325 MG tablet Take 1 tablet by mouth every 4 (four) hours as needed for moderate pain. Patient not taking: Reported on 12/20/2015 12/13/15   Lattie Haw, MD  oxyCODONE-acetaminophen (PERCOCET) 7.5-325 MG tablet Take 2 tablets by mouth every 4 (four) hours as needed for moderate pain. 12/21/15   Diego F Pabon, MD    Allergies Ceftriaxone sodium in dextrose and Ciprofloxacin  Family History  Problem Relation Age of Onset  . Asthma Mother     as child  . Migraines Mother   . Thyroid disease Mother   . Cancer Father   . COPD Father   . Cancer Maternal Grandmother     colon  . Cancer Paternal Grandfather     lung  . Asthma Brother     Social History Social History  Substance Use Topics  . Smoking status: Former Smoker    Packs/day: 0.25    Years: 6.00    Types: Cigarettes    Quit date: 03/15/2015  . Smokeless tobacco: Never Used  . Alcohol use No    Review of Systems Patient denies headaches, rhinorrhea, blurry vision, numbness, shortness of breath, chest pain, edema, cough, abdominal pain, nausea, vomiting, diarrhea, dysuria, fevers, rashes or  hallucinations unless otherwise stated above in HPI. ____________________________________________   PHYSICAL EXAM:  VITAL SIGNS: Vitals:   01/10/16 1714  BP: 138/86  Pulse: (!) 109  Resp: 16  Temp: 98.1 F (36.7 C)    Constitutional: Alert and oriented. Well appearing and in no acute distress. Eyes: Conjunctivae are normal. PERRL. EOMI. Head: Atraumatic. Nose: No congestion/rhinnorhea. Mouth/Throat: Mucous membranes are moist.  Oropharynx non-erythematous. Neck: No stridor. Painless ROM. No cervical spine  tenderness to palpation Hematological/Lymphatic/Immunilogical: No cervical lymphadenopathy. Cardiovascular: Normal rate, regular rhythm. Grossly normal heart sounds.  Good peripheral circulation. Respiratory: Normal respiratory effort.  No retractions. Lungs CTAB. Gastrointestinal: Soft and nontender. No distention. No abdominal bruits. No CVA tenderness. Genitourinary:  Musculoskeletal: No lower extremity tenderness nor edema.  No joint effusions. Neurologic:  Normal speech and language. No gross focal neurologic deficits are appreciated. No gait instability. Skin:  Skin is warm, dry and intact. No rash noted. Psychiatric: Mood and affect are normal. Speech and behavior are normal.  ____________________________________________   LABS (all labs ordered are listed, but only abnormal results are displayed)  No results found for this or any previous visit (from the past 24 hour(s)). ____________________________________________  ____________________________________________  RADIOLOGY   ____________________________________________   PROCEDURES  Procedure(s) performed: none    Critical Care performed: no ____________________________________________   INITIAL IMPRESSION / ASSESSMENT AND PLAN / ED COURSE  Pertinent labs & imaging results that were available during my care of the patient were reviewed by me and considered in my medical decision making (see chart for details).  DDX: Psychosis, delirium, medication effect, noncompliance, polysubstance abuse, Si, Hi, depression   Shelley Graham is a 20 y.o. who presents to the ED with for evaluation of anxiety.  Patient has psych history of reported anxiety. She is showing some signs of postpartum depression but denies any SI or HI. Has follow-up with psychiatrist. She is otherwise he mechanically stable and does not show any signs of instability or psychosis. She does not have any SI or HI. Patient value in by psychiatry at bedside  and felt stable for outpatient follow-up.  Have discussed with the patient and available family all diagnostics and treatments performed thus far and all questions were answered to the best of my ability. The patient demonstrates understanding and agreement with plan.   Clinical Course     ____________________________________________   FINAL CLINICAL IMPRESSION(S) / ED DIAGNOSES  Final diagnoses:  Anxiety      NEW MEDICATIONS STARTED DURING THIS VISIT:  New Prescriptions   No medications on file     Note:  This document was prepared using Dragon voice recognition software and may include unintentional dictation errors.    Willy EddyPatrick Dianne Bady, MD 01/10/16 2242
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
## 2016-01-10 NOTE — ED Triage Notes (Signed)
Pt with hx of depression and anxiety, reports she had a baby 2 months ago. Pt has been on medications due to being pregnant, pt was medication compliant before that. Pt reports she's been on medications since she was 14. Pt denies SI, HI. Pt reports occassional auditory hallucinations, paranoia and increased anxiety. Pt states "sometimes I feel like I can't take care of my baby, I just can't even get out of bed".

## 2016-02-09 ENCOUNTER — Telehealth: Payer: Self-pay | Admitting: Surgery

## 2016-02-09 NOTE — Telephone Encounter (Signed)
Returned phone call to patient at this time. She states that for the last 3 weeks she has had intermittent Left sided lumbar back pain and Right Upper Quadrant abdominal pain that is worse with movement. She also has complaints of diarrhea since surgery. States that this is a constant problem each time she eats. Denies nausea/vomiting, constipation, urinary symptoms, and fever/chills.  Patient was told to try some ice to area as well as Ibuprofen three times daily around the clock until she sees Dr. Everlene FarrierPabon next week. Placed on physician schedule.

## 2016-02-09 NOTE — Telephone Encounter (Signed)
Had gallbladder removed but still having pain. No fever or redness just soreness. Also some back pain. She stands up all day at work. She is back to work. Surgery was in August. She would like to have some pain medication

## 2016-02-13 ENCOUNTER — Ambulatory Visit: Payer: Managed Care, Other (non HMO) | Admitting: Surgery

## 2016-02-24 ENCOUNTER — Encounter: Payer: Self-pay | Admitting: Emergency Medicine

## 2016-02-24 ENCOUNTER — Emergency Department
Admission: EM | Admit: 2016-02-24 | Discharge: 2016-02-24 | Disposition: A | Discharge status: Home / Self Care | Payer: Managed Care, Other (non HMO) | Attending: Emergency Medicine | Admitting: Emergency Medicine

## 2016-02-24 DIAGNOSIS — I10 Essential (primary) hypertension: Secondary | ICD-10-CM | POA: Insufficient documentation

## 2016-02-24 DIAGNOSIS — Z202 Contact with and (suspected) exposure to infections with a predominantly sexual mode of transmission: Secondary | ICD-10-CM | POA: Diagnosis not present

## 2016-02-24 DIAGNOSIS — N898 Other specified noninflammatory disorders of vagina: Secondary | ICD-10-CM | POA: Diagnosis not present

## 2016-02-24 DIAGNOSIS — Z87891 Personal history of nicotine dependence: Secondary | ICD-10-CM | POA: Insufficient documentation

## 2016-02-24 NOTE — ED Provider Notes (Signed)
[facility] Emergency Department Provider Note   ____________________________________________   First MD Initiated Contact with Patient 02/24/16 1359     (approximate)  I have reviewed the triage vital signs and the nursing notes.   HISTORY  Chief Complaint SEXUALLY TRANSMITTED DISEASE   HPI Shelley Graham is a 20 y.o. female for a complaint of STD. Patient states she was seen by a doctor that she cannot remember the name or the clinic that she was seen at. She states that she was seen for vaginal discharge and itching. A diagnosis of chlamydia was possibly made but patient neither confirms nor denies that she was given a prescription for medication. She states that she fell longer has a job and wouldn't be able to buy the medication regardless of a prescription. She states that her boyfriend cheated on her and that he was treated for chlamydia however she relates that he was "given a shot". Patient states that she is in a hurry as someone is taking care of her child.   Past Medical History:  Diagnosis Date  . Anxiety   . Depression   . History of migraine headaches   . Hx of chlamydia infection   . Hypertension   . Pregnancy induced hypertension   . UTI (lower urinary tract infection)    RESOLVED    Patient Active Problem List   Diagnosis Date Noted  . Biliary colic 12/20/2015  . Acute posthemorrhagic anemia 11/07/2015  . Gestational hypertension 11/01/2015  . Obesity (BMI 30-39.9) 09/27/2015  . Gallstones and inflammation of gallbladder without obstruction 05/25/2015  . Marijuana use, episodic 05/25/2015  . GBS bacteriuria 03/14/2015  . Maternal varicella, non-immune 03/10/2015  . Rubella non-immune status, antepartum 03/10/2015  . History of migraine headaches   . Anxiety   . Depression     Past Surgical History:  Procedure Laterality Date  . CHOLECYSTECTOMY N/A 12/20/2015   Procedure: LAPAROSCOPIC CHOLECYSTECTOMY;  Surgeon: Leafy Roiego F  Pabon, MD;  Location: [facility] ORS;  Service: General;  Laterality: N/A;  . OTHER SURGICAL HISTORY     tubes ears 18 months  . PERINEAL LACERATION REPAIR N/A 11/03/2015   Procedure: SUTURE REPAIR PERINEAL LACERATION;  Surgeon: Hildred LaserAnika Cherry, MD;  Location: [facility] ORS;  Service: Gynecology;  Laterality: N/A;  . tubes in ears Bilateral     Prior to Admission medications   Medication Sig Start Date End Date Taking? Authorizing Provider  HYDROcodone-acetaminophen (NORCO/VICODIN) 5-325 MG tablet Take 1 tablet by mouth every 4 (four) hours as needed for moderate pain. Patient not taking: Reported on 12/20/2015 12/13/15   Lattie Hawichard E Cooper, MD  oxyCODONE-acetaminophen (PERCOCET) 7.5-325 MG tablet Take 2 tablets by mouth every 4 (four) hours as needed for moderate pain. 12/21/15   Diego F Pabon, MD    Allergies Ceftriaxone sodium in dextrose and Ciprofloxacin  Family History  Problem Relation Age of Onset  . Asthma Mother     as child  . Migraines Mother   . Thyroid disease Mother   . Cancer Father   . COPD Father   . Cancer Maternal Grandmother     colon  . Cancer Paternal Grandfather     lung  . Asthma Brother     Social History Social History  Substance Use Topics  . Smoking status: Former Smoker    Packs/day: 0.25    Years: 6.00    Types: Cigarettes    Quit date: 03/15/2015  . Smokeless tobacco: Never Used  . Alcohol use  No    Review of Systems Constitutional: No fever/chills Respiratory: Denies shortness of breath. Gastrointestinal:   No nausea, no vomiting.   Genitourinary: Positive for vaginal discharge and itching. Skin: Negative for rash.  10-point ROS otherwise negative.  ____________________________________________   PHYSICAL EXAM:  VITAL SIGNS: ED Triage Vitals  Enc Vitals Group     BP 02/24/16 1307 (!) 149/92     Pulse Rate 02/24/16 1307 (!) 109     Resp 02/24/16 1307 18     Temp 02/24/16 1307 98.3 F (36.8 C)     Temp Source 02/24/16 1307 Oral     SpO2  02/24/16 1307 98 %     Weight 02/24/16 1308 200 lb (90.7 kg)     Height 02/24/16 1308 5\' 8"  (1.727 m)     Head Circumference --      Peak Flow --      Pain Score 02/24/16 1308 8     Pain Loc --      Pain Edu? --      Excl. in GC? --     Constitutional: Alert and oriented. Well appearing and in no acute distress. Eyes: Conjunctivae are normal. PERRL. EOMI. Head: Atraumatic. Nose: No congestion/rhinnorhea. Neck: No stridor.   Respiratory: Normal respiratory effort.   Musculoskeletal: Moves upper and lower extremities without any difficulty. Normal gait was noted. Neurologic:  Normal speech and language. No gross focal neurologic deficits are appreciated. No gait instability. Psychiatric: Mood and affect are normal. Speech and behavior are normal.  ____________________________________________   LABS (all labs ordered are listed, but only abnormal results are displayed)  Labs Reviewed - No data to display ____________________________________________    PROCEDURES  Procedure(s) performed: None  Procedures  Critical Care performed: No  ____________________________________________   INITIAL IMPRESSION / ASSESSMENT AND PLAN / ED COURSE  Pertinent labs & imaging results that were available during my care of the patient were reviewed by me and considered in my medical decision making (see chart for details).    Clinical Course   Confirmation of any testing on this patient was not found under care everywhere therefore the doctors that she did see it is not connected with epic. Patient was informed that she would need to have a pelvic exam. Patient immediately said that she did not have time for a pelvic exam or any tests done and that she will return on Sunday. Patient was encouraged to get her prescription filled that she got from her doctor.  ____________________________________________   FINAL CLINICAL IMPRESSION(S) / ED DIAGNOSES  Final diagnoses:  Possible  exposure to STD      NEW MEDICATIONS STARTED DURING THIS VISIT:  Discharge Medication List as of 02/24/2016  2:09 PM       Note:  This document was prepared using Dragon voice recognition software and may include unintentional dictation errors.    Tommi Rumpshonda L Sujey Gundry, PA-C 02/24/16 1420    Jeanmarie PlantJames A McShane, MD 02/24/16 1520

## 2016-02-24 NOTE — ED Notes (Signed)
Patient called to triage. Per First nurse patient is outside using the phone.

## 2016-02-24 NOTE — ED Triage Notes (Signed)
Patients comes to ED via POV for STD treatment. Patient was tested after her boyfriend cheated on her and was positive for chlamydia. C/O vaginal discharge and itching.

## 2016-02-25 ENCOUNTER — Emergency Department
Admission: EM | Admit: 2016-02-25 | Discharge: 2016-02-25 | Disposition: A | Discharge status: Home / Self Care | Payer: Managed Care, Other (non HMO) | Attending: Emergency Medicine | Admitting: Emergency Medicine

## 2016-02-25 ENCOUNTER — Encounter: Payer: Self-pay | Admitting: Emergency Medicine

## 2016-02-25 DIAGNOSIS — M545 Low back pain, unspecified: Secondary | ICD-10-CM

## 2016-02-25 DIAGNOSIS — Z87891 Personal history of nicotine dependence: Secondary | ICD-10-CM | POA: Insufficient documentation

## 2016-02-25 DIAGNOSIS — Z79899 Other long term (current) drug therapy: Secondary | ICD-10-CM | POA: Insufficient documentation

## 2016-02-25 DIAGNOSIS — G8929 Other chronic pain: Secondary | ICD-10-CM | POA: Diagnosis not present

## 2016-02-25 DIAGNOSIS — N898 Other specified noninflammatory disorders of vagina: Secondary | ICD-10-CM | POA: Diagnosis not present

## 2016-02-25 DIAGNOSIS — Z202 Contact with and (suspected) exposure to infections with a predominantly sexual mode of transmission: Secondary | ICD-10-CM | POA: Insufficient documentation

## 2016-02-25 DIAGNOSIS — I10 Essential (primary) hypertension: Secondary | ICD-10-CM | POA: Insufficient documentation

## 2016-02-25 LAB — URINALYSIS COMPLETE WITH MICROSCOPIC (ARMC ONLY)
BACTERIA UA: NONE SEEN
Bilirubin Urine: NEGATIVE
GLUCOSE, UA: NEGATIVE mg/dL
HGB URINE DIPSTICK: NEGATIVE
Ketones, ur: NEGATIVE mg/dL
Nitrite: NEGATIVE
PH: 7 (ref 5.0–8.0)
PROTEIN: NEGATIVE mg/dL
RBC / HPF: NONE SEEN RBC/hpf (ref 0–5)
Specific Gravity, Urine: 1.013 (ref 1.005–1.030)

## 2016-02-25 LAB — POCT PREGNANCY, URINE: PREG TEST UR: NEGATIVE

## 2016-02-25 LAB — CHLAMYDIA/NGC RT PCR (ARMC ONLY)
CHLAMYDIA TR: DETECTED — AB
N gonorrhoeae: NOT DETECTED

## 2016-02-25 LAB — WET PREP, GENITAL
SPERM: NONE SEEN
Trich, Wet Prep: NONE SEEN
Yeast Wet Prep HPF POC: NONE SEEN

## 2016-02-25 MED ORDER — ACETAMINOPHEN 325 MG PO TABS
650.0000 mg | ORAL_TABLET | Freq: Once | ORAL | Status: AC
  Administered 2016-02-25: 650 mg via ORAL
  Filled 2016-02-25: qty 2

## 2016-02-25 MED ORDER — AZITHROMYCIN 500 MG PO TABS
1000.0000 mg | ORAL_TABLET | Freq: Once | ORAL | Status: AC
  Administered 2016-02-25: 1000 mg via ORAL
  Filled 2016-02-25: qty 2

## 2016-02-25 NOTE — ED Triage Notes (Signed)
FIRST NURSE NOTE:  C/O abdominal pain and back pain.  Abdominal pain x 2 weeks and back pain since previous pregnancy.  States was seen through ED yesterday, but left AMA because she had to pick up daughter.  Patient states she just ate [employer] PTA.    AAOx3.  Skin warm and dry.  Posture upright and relaxed.  Gait steady and easy.  NAD.

## 2016-02-25 NOTE — ED Triage Notes (Signed)
States here for std check - boyfriend being treated for chlymidia

## 2016-02-25 NOTE — ED Notes (Signed)
States was here yesterday and left without being seen. States wants checked for std and for her chronic back pain

## 2016-02-25 NOTE — ED Notes (Signed)
Pt given drink and cup of ice.

## 2016-02-25 NOTE — ED Notes (Signed)
Patient called for triage.  Patient outside "taking a call".

## 2016-02-25 NOTE — ED Provider Notes (Signed)
[facility] Emergency Department Provider Note  ____________________________________________   I have reviewed the triage vital signs and the nursing notes.   HISTORY  Chief Complaint SEXUALLY TRANSMITTED DISEASE and Back Pain    HPI Shelley Graham is a 20 y.o. female his boyfriend is in prison. He was tested on the way to prison 2 months ago and was found to have Chlamydia. Patient states she went to another doctor but they didn't give her anything because she did not have her Medicaid card. She was here yesterday for vaginal discharge but left without a pelvic exam she had another appointment to pick up her daughter. Today however she has sufficient time and she would like to be treated for possible chlamydia. She has had some vaginal discharge and itching for the last several weeks. The patient also states she has chronic back pain since 2 years ago and she wants something "stronger" because it always hurts. She denies any recent injury or any incontinence of bowel or bladder she denies any numbness or weakness, its lumbar back pain that is worse and she picks her daughter up. No trauma.No radiation.   Past Medical History:  Diagnosis Date  . Anxiety   . Depression   . History of migraine headaches   . Hx of chlamydia infection   . Hypertension   . Pregnancy induced hypertension   . UTI (lower urinary tract infection)    RESOLVED    Patient Active Problem List   Diagnosis Date Noted  . Biliary colic 12/20/2015  . Acute posthemorrhagic anemia 11/07/2015  . Gestational hypertension 11/01/2015  . Obesity (BMI 30-39.9) 09/27/2015  . Gallstones and inflammation of gallbladder without obstruction 05/25/2015  . Marijuana use, episodic 05/25/2015  . GBS bacteriuria 03/14/2015  . Maternal varicella, non-immune 03/10/2015  . Rubella non-immune status, antepartum 03/10/2015  . History of migraine headaches   . Anxiety   . Depression     Past Surgical  History:  Procedure Laterality Date  . CHOLECYSTECTOMY N/A 12/20/2015   Procedure: LAPAROSCOPIC CHOLECYSTECTOMY;  Surgeon: Leafy Ro, MD;  Location: [facility] ORS;  Service: General;  Laterality: N/A;  . OTHER SURGICAL HISTORY     tubes ears 18 months  . PERINEAL LACERATION REPAIR N/A 11/03/2015   Procedure: SUTURE REPAIR PERINEAL LACERATION;  Surgeon: Hildred Laser, MD;  Location: [facility] ORS;  Service: Gynecology;  Laterality: N/A;  . tubes in ears Bilateral     Prior to Admission medications   Medication Sig Start Date End Date Taking? Authorizing Provider  HYDROcodone-acetaminophen (NORCO/VICODIN) 5-325 MG tablet Take 1 tablet by mouth every 4 (four) hours as needed for moderate pain. Patient not taking: Reported on 12/20/2015 12/13/15   Lattie Haw, MD  oxyCODONE-acetaminophen (PERCOCET) 7.5-325 MG tablet Take 2 tablets by mouth every 4 (four) hours as needed for moderate pain. 12/21/15   Diego F Pabon, MD    Allergies Ceftriaxone sodium in dextrose and Ciprofloxacin  Family History  Problem Relation Age of Onset  . Asthma Mother     as child  . Migraines Mother   . Thyroid disease Mother   . Cancer Father   . COPD Father   . Cancer Maternal Grandmother     colon  . Cancer Paternal Grandfather     lung  . Asthma Brother     Social History Social History  Substance Use Topics  . Smoking status: Former Smoker    Packs/day: 0.25    Years: 6.00  Types: Cigarettes    Quit date: 03/15/2015  . Smokeless tobacco: Never Used  . Alcohol use No    Review of Systems Constitutional: No fever/chills Eyes: No visual changes. ENT: No sore throat. No stiff neck no neck pain Cardiovascular: Denies chest pain. Respiratory: Denies shortness of breath. Gastrointestinal:   no vomiting.  No diarrhea.  No constipation. Genitourinary: Negative for dysuria. Musculoskeletal: Negative lower extremity swelling Skin: Negative for rash. Neurological: Negative for severe headaches, focal  weakness or numbness. 10-point ROS otherwise negative.  ____________________________________________   PHYSICAL EXAM:  VITAL SIGNS: ED Triage Vitals [02/25/16 1241]  Enc Vitals Group     BP 134/64     Pulse Rate (!) 110     Resp 18     Temp 98.2 F (36.8 C)     Temp Source Oral     SpO2 100 %     Weight 200 lb (90.7 kg)     Height 5\' 8"  (1.727 m)     Head Circumference      Peak Flow      Pain Score      Pain Loc      Pain Edu?      Excl. in GC?     Constitutional: Alert and oriented. Well appearing and in no acute distress. Eyes: Conjunctivae are normal. PERRL. EOMI. Head: Atraumatic. Nose: No congestion/rhinnorhea. Mouth/Throat: Mucous membranes are moist.  Oropharynx non-erythematous. Neck: No stridor.   Nontender with no meningismus Cardiovascular: Normal rate, regular rhythm. Grossly normal heart sounds.  Good peripheral circulation. Respiratory: Normal respiratory effort.  No retractions. Lungs CTAB. Abdominal: Soft and nontender. No distention. No guarding no rebound Back:  There is minimal tenderness to palpation in the paraspinal muscles of the lower back. However, she is also seen to twist in the bed to reach her cell phone and is able to lift herself up onto the pelvic exam bed pain with no discomfort.  there is no midline tenderness there are no lesions noted. there is no CVA tenderness Pelvic exam: Female nurse chaperone present, no external lesions noted, positive slightly yellow vaginal discharge noted with no purulent discharge, no cervical motion tenderness, no adnexal tenderness or mass, there is no significant uterine tenderness or mass. No vaginal bleeding Musculoskeletal: No lower extremity tenderness, no upper extremity tenderness. No joint effusions, no DVT signs strong distal pulses no edema Neurologic:  Normal speech and language. No gross focal neurologic deficits are appreciated. No saddle anesthesia Skin:  Skin is warm, dry and intact. No rash  noted. Psychiatric: Mood and affect are normal. Speech and behavior are normal.  ____________________________________________   LABS (all labs ordered are listed, but only abnormal results are displayed)  Labs Reviewed  CHLAMYDIA/NGC RT PCR ([facility] ONLY)  WET PREP, GENITAL  CHLAMYDIA/NGC RT PCR ([facility] ONLY)  WET PREP, GENITAL  URINALYSIS COMPLETEWITH MICROSCOPIC ([facility] ONLY)  RPR  HIV ANTIBODY (ROUTINE TESTING)  POC URINE PREG, ED   ____________________________________________  EKG  I personally interpreted any EKGs ordered by me or triage  ____________________________________________  RADIOLOGY  I reviewed any imaging ordered by me or triage that were performed during my shift and, if possible, patient and/or family made aware of any abnormal findings. ____________________________________________   PROCEDURES  Procedure(s) performed: None  Procedures  Critical Care performed: None  ____________________________________________   INITIAL IMPRESSION / ASSESSMENT AND PLAN / ED COURSE  Pertinent labs & imaging results that were available during my care of the patient were reviewed by me and  considered in my medical decision making (see chart for details).  Patient with 2 complaints the first is she wants to be treated for Chlamydia exposure which we can certainly do the second is that she has chronic back pain. I give her Tylenol and she states "you can't give me anything stronger"?, Patient would like narcotics for chronic back pain. It is no evidence of acute injury midline tenderness radiation or cauda equina. No evidence of abscess or mass or hematoma. No evidence of bruising or shingles. Dry cannot give her narcotics she is disappointed but understands. We will send HIV and RPR, this will be followed up by the follow-up nurse. We will await wet prep for trichomonas evaluation we'll treat the patient with azithromycin. Patient has an allergy to  ceftriaxone and is her exposure is thought to be Chlamydia and not gonorrhea, we will defer this medication.  Clinical Course   ____________________________________________   FINAL CLINICAL IMPRESSION(S) / ED DIAGNOSES  Final diagnoses:  None      This chart was dictated using voice recognition software.  Despite best efforts to proofread,  errors can occur which can change meaning.      Jeanmarie PlantJames A McShane, MD 02/25/16 [phone]

## 2016-02-26 ENCOUNTER — Telehealth: Payer: Self-pay | Admitting: Emergency Medicine

## 2016-02-26 LAB — HIV ANTIBODY (ROUTINE TESTING W REFLEX): HIV Screen 4th Generation wRfx: NONREACTIVE

## 2016-02-26 NOTE — Telephone Encounter (Addendum)
Called to inform patient of positive chlamydia test.  She was treated int he ED.  She asked about her back pain and getting a referral. She says she does not have pcp, but she does have medicaid.  I explained that she will need a referral from her pcp and encouraged her to find a pcp.    11/2--called pt to update on test results.  hiv neg.  rpr pos, but the t palladium was negative.  Patient does have an appt with a new pcp on nov 15.

## 2016-02-27 LAB — RPR: RPR: REACTIVE — AB

## 2016-02-27 LAB — RPR, QUANT+TP ABS (REFLEX)
Rapid Plasma Reagin, Quant: 1:1 {titer} — ABNORMAL HIGH
TREPONEMA PALLIDUM AB: NEGATIVE

## 2016-02-29 ENCOUNTER — Telehealth: Payer: Self-pay | Admitting: Emergency Medicine

## 2016-03-02 ENCOUNTER — Emergency Department
Admission: EM | Admit: 2016-03-02 | Discharge: 2016-03-02 | Disposition: A | Discharge status: Home / Self Care | Payer: Managed Care, Other (non HMO) | Attending: Emergency Medicine | Admitting: Emergency Medicine

## 2016-03-02 ENCOUNTER — Emergency Department: Payer: Managed Care, Other (non HMO)

## 2016-03-02 DIAGNOSIS — F1290 Cannabis use, unspecified, uncomplicated: Secondary | ICD-10-CM | POA: Insufficient documentation

## 2016-03-02 DIAGNOSIS — Z79899 Other long term (current) drug therapy: Secondary | ICD-10-CM | POA: Diagnosis not present

## 2016-03-02 DIAGNOSIS — N3090 Cystitis, unspecified without hematuria: Secondary | ICD-10-CM | POA: Diagnosis not present

## 2016-03-02 DIAGNOSIS — I10 Essential (primary) hypertension: Secondary | ICD-10-CM | POA: Diagnosis not present

## 2016-03-02 DIAGNOSIS — F1910 Other psychoactive substance abuse, uncomplicated: Secondary | ICD-10-CM | POA: Diagnosis not present

## 2016-03-02 DIAGNOSIS — R51 Headache: Secondary | ICD-10-CM | POA: Diagnosis present

## 2016-03-02 DIAGNOSIS — Z87891 Personal history of nicotine dependence: Secondary | ICD-10-CM | POA: Insufficient documentation

## 2016-03-02 LAB — URINALYSIS COMPLETE WITH MICROSCOPIC (ARMC ONLY)
BILIRUBIN URINE: NEGATIVE
GLUCOSE, UA: NEGATIVE mg/dL
HGB URINE DIPSTICK: NEGATIVE
KETONES UR: NEGATIVE mg/dL
NITRITE: NEGATIVE
Protein, ur: 100 mg/dL — AB
SPECIFIC GRAVITY, URINE: 1.027 (ref 1.005–1.030)
pH: 5 (ref 5.0–8.0)

## 2016-03-02 LAB — CBC
HEMATOCRIT: 36.3 % (ref 35.0–47.0)
HEMOGLOBIN: 11.7 g/dL — AB (ref 12.0–16.0)
MCH: 23.3 pg — ABNORMAL LOW (ref 26.0–34.0)
MCHC: 32.4 g/dL (ref 32.0–36.0)
MCV: 72 fL — ABNORMAL LOW (ref 80.0–100.0)
Platelets: 351 10*3/uL (ref 150–440)
RBC: 5.04 MIL/uL (ref 3.80–5.20)
RDW: 19.8 % — ABNORMAL HIGH (ref 11.5–14.5)
WBC: 12.3 10*3/uL — AB (ref 3.6–11.0)

## 2016-03-02 LAB — URINE DRUG SCREEN, QUALITATIVE (ARMC ONLY)
AMPHETAMINES, UR SCREEN: NOT DETECTED
BARBITURATES, UR SCREEN: NOT DETECTED
Benzodiazepine, Ur Scrn: POSITIVE — AB
COCAINE METABOLITE, UR ~~LOC~~: POSITIVE — AB
Cannabinoid 50 Ng, Ur ~~LOC~~: POSITIVE — AB
MDMA (ECSTASY) UR SCREEN: NOT DETECTED
METHADONE SCREEN, URINE: NOT DETECTED
Opiate, Ur Screen: NOT DETECTED
Phencyclidine (PCP) Ur S: NOT DETECTED
TRICYCLIC, UR SCREEN: NOT DETECTED

## 2016-03-02 LAB — COMPREHENSIVE METABOLIC PANEL
ALT: 19 U/L (ref 14–54)
ANION GAP: 8 (ref 5–15)
AST: 18 U/L (ref 15–41)
Albumin: 4.2 g/dL (ref 3.5–5.0)
Alkaline Phosphatase: 95 U/L (ref 38–126)
BILIRUBIN TOTAL: 1.2 mg/dL (ref 0.3–1.2)
BUN: 13 mg/dL (ref 6–20)
CHLORIDE: 105 mmol/L (ref 101–111)
CO2: 27 mmol/L (ref 22–32)
Calcium: 9.4 mg/dL (ref 8.9–10.3)
Creatinine, Ser: 0.75 mg/dL (ref 0.44–1.00)
GFR calc Af Amer: >60 mL/min (ref >60)
Glucose, Bld: 87 mg/dL (ref 65–99)
POTASSIUM: 3.4 mmol/L — AB (ref 3.5–5.1)
Sodium: 140 mmol/L (ref 135–145)
TOTAL PROTEIN: 7.6 g/dL (ref 6.5–8.1)

## 2016-03-02 LAB — POCT PREGNANCY, URINE: PREG TEST UR: NEGATIVE

## 2016-03-02 LAB — ETHANOL

## 2016-03-02 MED ORDER — SENNA 8.6 MG PO TABS: 2.0000 | ORAL_TABLET | Freq: Two times a day (BID) | ORAL | 0 refills | Status: DC

## 2016-03-02 MED ORDER — DOCUSATE SODIUM 100 MG PO CAPS: 200.0000 mg | ORAL_CAPSULE | Freq: Two times a day (BID) | ORAL | 0 refills | Status: DC

## 2016-03-02 MED ORDER — NITROFURANTOIN MACROCRYSTAL 100 MG PO CAPS: 100.0000 mg | ORAL_CAPSULE | Freq: Two times a day (BID) | ORAL | 0 refills | Status: DC

## 2016-03-02 NOTE — ED Triage Notes (Signed)
Pt reports she is here for detox. States has last used cocaine yesterday and some other drugs that some one gave her. Denies SI/HI. Denies alcohol abuse.

## 2016-03-02 NOTE — ED Notes (Signed)
Pt's belongings include, purse, keys, shoes phone and clothing. Pt took out multiple ear rings and nose ring that she placed in her purse. Everything was placed in a belongings bag hand handed off to nurse in behavioral quad.

## 2016-03-02 NOTE — ED Notes (Signed)
Patient transported to CT 

## 2016-03-02 NOTE — ED Provider Notes (Signed)
[facility] Emergency Department Provider Note  ____________________________________________  Time seen: Approximately 7:11 PM  I have reviewed the triage vital signs and the nursing notes.   HISTORY  Chief Complaint Drug Problem    HPI Shelley Graham is a 20 y.o. 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 She states that she had been clean for about a year and then about 2 weeks ago started using. She's only is Mollie once or twice in this time, last use was a week ago. But she reports daily cocaine use and generalized headaches associated with that over the last 2 weeks. Denies acute thunderclap headaches or any numbness tingling or weakness by report some occasional blurry vision along with generalized headache.  He was seen in the ED about a week ago for possible chlamydia and low back pain at which point she seemed to be trying to acquire opioid medications.     Past Medical History:  Diagnosis Date  . Anxiety   . Depression   . History of migraine headaches   . Hx of chlamydia infection   . Hypertension   . Pregnancy induced hypertension   . UTI (lower urinary tract infection)    RESOLVED     Patient Active Problem List   Diagnosis Date Noted  . Biliary colic 12/20/2015  . Acute posthemorrhagic anemia 11/07/2015  . Gestational hypertension 11/01/2015  . Obesity (BMI 30-39.9) 09/27/2015  . Gallstones and inflammation of gallbladder without obstruction 05/25/2015  . Marijuana use, episodic 05/25/2015  . GBS bacteriuria 03/14/2015  . Maternal varicella, non-immune 03/10/2015  . Rubella non-immune status, antepartum 03/10/2015  . History of migraine headaches   . Anxiety   . Depression      Past Surgical History:  Procedure Laterality Date  . CHOLECYSTECTOMY N/A 12/20/2015   Procedure: LAPAROSCOPIC CHOLECYSTECTOMY;  Surgeon: Leafy Roiego F Pabon, MD;  Location: [facility] ORS;  Service: General;  Laterality: N/A;  . OTHER SURGICAL  HISTORY     tubes ears 18 months  . PERINEAL LACERATION REPAIR N/A 11/03/2015   Procedure: SUTURE REPAIR PERINEAL LACERATION;  Surgeon: Hildred LaserAnika Cherry, MD;  Location: [facility] ORS;  Service: Gynecology;  Laterality: N/A;  . tubes in ears Bilateral      Prior to Admission medications   Medication Sig Start Date End Date Taking? Authorizing Provider  docusate sodium (COLACE) 100 MG capsule Take 2 capsules (200 mg total) by mouth 2 (two) times daily. 03/02/16   Sharman CheekPhillip Dalayza Zambrana, MD  HYDROcodone-acetaminophen (NORCO/VICODIN) 5-325 MG tablet Take 1 tablet by mouth every 4 (four) hours as needed for moderate pain. Patient not taking: Reported on 12/20/2015 12/13/15   Lattie Hawichard E Cooper, MD  nitrofurantoin (MACRODANTIN) 100 MG capsule Take 1 capsule (100 mg total) by mouth 2 (two) times daily. 03/02/16   Sharman CheekPhillip Margret Moat, MD  oxyCODONE-acetaminophen (PERCOCET) 7.5-325 MG tablet Take 2 tablets by mouth every 4 (four) hours as needed for moderate pain. 12/21/15   Diego Ronnette JuniperF Pabon, MD  senna (SENOKOT) 8.6 MG TABS tablet Take 2 tablets (17.2 mg total) by mouth 2 (two) times daily. 03/02/16   Sharman CheekPhillip Tyden Kann, MD     Allergies Ceftriaxone sodium in dextrose and Ciprofloxacin   Family History  Problem Relation Age of Onset  . Asthma Mother     as child  . Migraines Mother   . Thyroid disease Mother   . Cancer Father   . COPD Father   . Cancer Maternal Grandmother     colon  . Cancer  Paternal Grandfather     lung  . Asthma Brother     Social History Social History  Substance Use Topics  . Smoking status: Former Smoker    Packs/day: 0.25    Years: 6.00    Types: Cigarettes    Quit date: 03/15/2015  . Smokeless tobacco: Never Used  . Alcohol use No    Review of Systems  Constitutional:   No fever or chills.  ENT:   No sore throat. No rhinorrhea. Cardiovascular:   No chest pain. Respiratory:   No dyspnea or cough. Gastrointestinal:   Negative for abdominal pain, vomiting and diarrhea.   Neurological:   Positive as above for headaches 10-point ROS otherwise negative.  ____________________________________________   PHYSICAL EXAM:  VITAL SIGNS: ED Triage Vitals  Enc Vitals Group     BP 03/02/16 1713 122/68     Pulse Rate 03/02/16 1713 (!) 105     Resp 03/02/16 1713 20     Temp 03/02/16 1713 97.7 F (36.5 C)     Temp Source 03/02/16 1713 Oral     SpO2 03/02/16 1713 97 %     Weight 03/02/16 1720 200 lb (90.7 kg)     Height 03/02/16 1720 5\' 8"  (1.727 m)     Head Circumference --      Peak Flow --      Pain Score 03/02/16 1720 10     Pain Loc --      Pain Edu? --      Excl. in GC? --     Vital signs reviewed, nursing assessments reviewed.   Constitutional:   Alert and oriented. Well appearing and in no distress. Eyes:   No scleral icterus. No conjunctival pallor. PERRL. EOMI.  No nystagmus. ENT   Head:   Normocephalic and atraumatic.   Nose:   No congestion/rhinnorhea. No septal hematoma   Mouth/Throat:   MMM, no pharyngeal erythema. No peritonsillar mass.    Neck:   No stridor. No SubQ emphysema. No meningismus. Hematological/Lymphatic/Immunilogical:   No cervical lymphadenopathy. Cardiovascular:   RRR. Symmetric bilateral radial and DP pulses.  No murmurs.  Respiratory:   Normal respiratory effort without tachypnea nor retractions. Breath sounds are clear and equal bilaterally. No wheezes/rales/rhonchi. Gastrointestinal:   Soft and nontender. Non distended. There is no CVA tenderness.  No rebound, rigidity, or guarding. Genitourinary:   deferred Musculoskeletal:   Nontender with normal range of motion in all extremities. No joint effusions.  No lower extremity tenderness.  No edema. Neurologic:   Normal speech and language.  CN 2-10 normal. Motor grossly intact. No gross focal neurologic deficits are appreciated.  Skin:    Skin is warm, dry and intact. No rash noted.  No petechiae, purpura, or  bullae.  ____________________________________________    LABS (pertinent positives/negatives) (all labs ordered are listed, but only abnormal results are displayed) Labs Reviewed  COMPREHENSIVE METABOLIC PANEL - Abnormal; Notable for the following:       Result Value   Potassium 3.4 (*)    All other components within normal limits  CBC - Abnormal; Notable for the following:    WBC 12.3 (*)    Hemoglobin 11.7 (*)    MCV 72.0 (*)    MCH 23.3 (*)    RDW 19.8 (*)    All other components within normal limits  URINE DRUG SCREEN, QUALITATIVE ([facility] ONLY) - Abnormal; Notable for the following:    Cocaine Metabolite,Ur [place] POSITIVE (*)    Cannabinoid 50 Ng, Ur  [place] POSITIVE (*)    Benzodiazepine, Ur [place] POSITIVE (*)    All other components within normal limits  URINALYSIS COMPLETEWITH MICROSCOPIC ([facility] ONLY) - Abnormal; Notable for the following:    Color, Urine YELLOW (*)    APPearance TURBID (*)    Protein, ur 100 (*)    Leukocytes, UA 1+ (*)    Bacteria, UA RARE (*)    Squamous Epithelial / LPF 6-30 (*)    All other components within normal limits  URINE CULTURE  ETHANOL  POC URINE PREG, ED  POCT PREGNANCY, URINE   ____________________________________________   EKG    ____________________________________________    RADIOLOGY  CT head unremarkable  ____________________________________________   PROCEDURES Procedures  ____________________________________________   INITIAL IMPRESSION / ASSESSMENT AND PLAN / ED COURSE  Pertinent labs & imaging results that were available during my care of the patient were reviewed by me and considered in my medical decision making (see chart for details).  Patient well appearing no acute distress. Vital signs unremarkable, request detox from amphetamines and cocaine. I advised the patient we do not have a detox program for this and that the withdrawal syndrome from these is not life-threatening. I advised her to seek outpatient  counseling and rehabilitation programs provided her information about this such as [facility]. We'll get a CT head today to evaluate for an intracranial hemorrhage. Her blood pressures totally normal she's not in distress has no neurologic findings and I have a low suspicion but with her prolonged cocaine use and reported symptoms it's worth further screening with imaging today. If CT head doesn't reveal any severe findings the patient is suitable for outpatient follow-up.  Considering the patient's symptoms, medical history, and physical examination today, I have low suspicion for ischemic stroke, intracranial hemorrhage, meningitis, encephalitis, carotid or vertebral dissection, venous sinus thrombosis, MS, intracranial hypertension, glaucoma, CRAO, CRVO, or temporal arteritis.    ----------------------------------------- 8:29 PM on 03/02/2016 -----------------------------------------  CT head negative. Patient remains medically stable, comfortable with unremarkable vital signs. She does report some cystitis and her urinalysis is consistent with urinary tract infection. I'll give her Macrobid for cystitis.    Clinical Course   ____________________________________________   FINAL CLINICAL IMPRESSION(S) / ED DIAGNOSES  Final diagnoses:  Polysubstance abuse  Cystitis       Portions of this note were generated with dragon dictation software. Dictation errors may occur despite best attempts at proofreading.    Sharman CheekPhillip Ugonna Keirsey, MD 03/02/16 2030

## 2016-03-04 LAB — URINE CULTURE

## 2016-03-14 ENCOUNTER — Ambulatory Visit: Payer: Self-pay | Admitting: Internal Medicine

## 2016-03-14 DIAGNOSIS — Z0289 Encounter for other administrative examinations: Secondary | ICD-10-CM

## 2016-06-12 ENCOUNTER — Encounter: Payer: Self-pay | Admitting: *Deleted

## 2016-06-12 DIAGNOSIS — Z87891 Personal history of nicotine dependence: Secondary | ICD-10-CM | POA: Diagnosis not present

## 2016-06-12 DIAGNOSIS — N760 Acute vaginitis: Secondary | ICD-10-CM | POA: Insufficient documentation

## 2016-06-12 DIAGNOSIS — Z202 Contact with and (suspected) exposure to infections with a predominantly sexual mode of transmission: Secondary | ICD-10-CM | POA: Diagnosis present

## 2016-06-12 DIAGNOSIS — I10 Essential (primary) hypertension: Secondary | ICD-10-CM | POA: Insufficient documentation

## 2016-06-12 LAB — POCT PREGNANCY, URINE: PREG TEST UR: NEGATIVE

## 2016-06-12 LAB — URINALYSIS, COMPLETE (UACMP) WITH MICROSCOPIC
Bilirubin Urine: NEGATIVE
Glucose, UA: NEGATIVE mg/dL
HGB URINE DIPSTICK: NEGATIVE
Ketones, ur: NEGATIVE mg/dL
NITRITE: NEGATIVE
PH: 5 (ref 5.0–8.0)
Protein, ur: 30 mg/dL — AB
SPECIFIC GRAVITY, URINE: 1.024 (ref 1.005–1.030)

## 2016-06-12 NOTE — ED Triage Notes (Addendum)
Pt reports boyfriend told her today he has gonnorhea.  Pt here for check of std.  Pt has vag discharge.  No vag bleeding.  No dysuria. Pt has back pain

## 2016-06-13 ENCOUNTER — Emergency Department
Admission: EM | Admit: 2016-06-13 | Discharge: 2016-06-13 | Disposition: A | Discharge status: Home / Self Care | Payer: BLUE CROSS/BLUE SHIELD | Attending: Emergency Medicine | Admitting: Emergency Medicine

## 2016-06-13 DIAGNOSIS — N760 Acute vaginitis: Secondary | ICD-10-CM

## 2016-06-13 DIAGNOSIS — B9689 Other specified bacterial agents as the cause of diseases classified elsewhere: Secondary | ICD-10-CM

## 2016-06-13 DIAGNOSIS — Z202 Contact with and (suspected) exposure to infections with a predominantly sexual mode of transmission: Secondary | ICD-10-CM

## 2016-06-13 LAB — WET PREP, GENITAL
SPERM: NONE SEEN
Trich, Wet Prep: NONE SEEN
WBC WET PREP: NONE SEEN
YEAST WET PREP: NONE SEEN

## 2016-06-13 LAB — CHLAMYDIA/NGC RT PCR (ARMC ONLY)
Chlamydia Tr: NOT DETECTED
N GONORRHOEAE: NOT DETECTED

## 2016-06-13 MED ORDER — ONDANSETRON 4 MG PO TBDP
4.0000 mg | ORAL_TABLET | Freq: Once | ORAL | Status: AC
  Administered 2016-06-13: 4 mg via ORAL
  Filled 2016-06-13: qty 1

## 2016-06-13 MED ORDER — METRONIDAZOLE 500 MG PO TABS
2000.0000 mg | ORAL_TABLET | Freq: Once | ORAL | Status: AC
  Administered 2016-06-13: 2000 mg via ORAL
  Filled 2016-06-13: qty 4

## 2016-06-13 MED ORDER — LIDOCAINE HCL (PF) 1 % IJ SOLN
INTRAMUSCULAR | Status: AC
  Filled 2016-06-13: qty 5

## 2016-06-13 MED ORDER — AZITHROMYCIN 500 MG PO TABS
2000.0000 mg | ORAL_TABLET | Freq: Once | ORAL | Status: AC
  Administered 2016-06-13: 1000 mg via ORAL
  Filled 2016-06-13: qty 2

## 2016-06-13 MED ORDER — GENTAMICIN SULFATE 40 MG/ML IJ SOLN
240.0000 mg | Freq: Once | INTRAMUSCULAR | Status: AC
  Administered 2016-06-13: 240 mg via INTRAMUSCULAR
  Filled 2016-06-13: qty 6

## 2016-06-13 MED ORDER — AZITHROMYCIN 1 G PO PACK
1.0000 g | PACK | Freq: Once | ORAL | Status: DC
  Administered 2016-06-13: 1 g via ORAL
  Filled 2016-06-13: qty 1

## 2016-06-13 NOTE — Discharge Instructions (Signed)
You have been treated empirically for STD exposure. Return to the ER for worsening symptoms, fever, vomiting or other concerns.

## 2016-06-13 NOTE — ED Provider Notes (Signed)
[facility] Emergency Department Provider Note   ____________________________________________   First MD Initiated Contact with Patient 06/13/16 0101     (approximate)  I have reviewed the triage vital signs and the nursing notes.   HISTORY  Chief Complaint Exposure to STD    HPI [place] is a 21 y.o. female who presents to the ED from home with a chief complain of STD exposure. Reports her boyfriend told her today that he has gonorrhea and trichomonas; treated for both. Patient presents for STD check. Complains of vaginal discharge and lower back pain for the past 4-6 weeks. Last sexual intercourse 2 weeks ago. Denies recent fever, chills, chest pain, shortness of breath, abdominal pain, nausea, vomiting, vaginal bleeding. Denies recent travel or trauma. Nothing makes her symptoms better or worse.   Past Medical History:  Diagnosis Date  . Anxiety   . Depression   . History of migraine headaches   . Hx of chlamydia infection   . Hypertension   . Pregnancy induced hypertension   . UTI (lower urinary tract infection)    RESOLVED    Patient Active Problem List   Diagnosis Date Noted  . Biliary colic 12/20/2015  . Acute posthemorrhagic anemia 11/07/2015  . Gestational hypertension 11/01/2015  . Obesity (BMI 30-39.9) 09/27/2015  . Gallstones and inflammation of gallbladder without obstruction 05/25/2015  . Marijuana use, episodic 05/25/2015  . GBS bacteriuria 03/14/2015  . Maternal varicella, non-immune 03/10/2015  . Rubella non-immune status, antepartum 03/10/2015  . History of migraine headaches   . Anxiety   . Depression     Past Surgical History:  Procedure Laterality Date  . CHOLECYSTECTOMY N/A 12/20/2015   Procedure: LAPAROSCOPIC CHOLECYSTECTOMY;  Surgeon: Leafy Ro, MD;  Location: [facility] ORS;  Service: General;  Laterality: N/A;  . OTHER SURGICAL HISTORY     tubes ears 18 months  . PERINEAL LACERATION REPAIR N/A 11/03/2015   Procedure: SUTURE REPAIR PERINEAL LACERATION;  Surgeon: Hildred Laser, MD;  Location: [facility] ORS;  Service: Gynecology;  Laterality: N/A;  . tubes in ears Bilateral     Prior to Admission medications   Medication Sig Start Date End Date Taking? Authorizing Provider  docusate sodium (COLACE) 100 MG capsule Take 2 capsules (200 mg total) by mouth 2 (two) times daily. 03/02/16   Sharman Cheek, MD  HYDROcodone-acetaminophen (NORCO/VICODIN) 5-325 MG tablet Take 1 tablet by mouth every 4 (four) hours as needed for moderate pain. Patient not taking: Reported on 12/20/2015 12/13/15   Lattie Haw, MD  nitrofurantoin (MACRODANTIN) 100 MG capsule Take 1 capsule (100 mg total) by mouth 2 (two) times daily. 03/02/16   Sharman Cheek, MD  oxyCODONE-acetaminophen (PERCOCET) 7.5-325 MG tablet Take 2 tablets by mouth every 4 (four) hours as needed for moderate pain. 12/21/15   Diego Ronnette Juniper, MD  senna (SENOKOT) 8.6 MG TABS tablet Take 2 tablets (17.2 mg total) by mouth 2 (two) times daily. 03/02/16   Sharman Cheek, MD    Allergies Ceftriaxone sodium in dextrose and Ciprofloxacin  Family History  Problem Relation Age of Onset  . Asthma Mother     as child  . Migraines Mother   . Thyroid disease Mother   . Cancer Father   . COPD Father   . Cancer Maternal Grandmother     colon  . Cancer Paternal Grandfather     lung  . Asthma Brother     Social History Social History  Substance Use Topics  . Smoking  status: Former Smoker    Packs/day: 0.25    Years: 6.00    Types: Cigarettes    Quit date: 03/15/2015  . Smokeless tobacco: Never Used  . Alcohol use No    Review of Systems  Constitutional: No fever/chills. Eyes: No visual changes. ENT: No sore throat. Cardiovascular: Denies chest pain. Respiratory: Denies shortness of breath. Gastrointestinal: No abdominal pain.  No nausea, no vomiting.  No diarrhea.  No constipation. Genitourinary: Positive for vaginal discharge. Negative for  dysuria. Musculoskeletal: Positive for lower back pain. Skin: Negative for rash. Neurological: Negative for headaches, focal weakness or numbness.  10-point ROS otherwise negative.  ____________________________________________   PHYSICAL EXAM:  VITAL SIGNS: ED Triage Vitals  Enc Vitals Group     BP 06/12/16 2205 (!) 134/92     Pulse Rate 06/12/16 2205 89     Resp 06/12/16 2205 18     Temp 06/12/16 2205 98.3 F (36.8 C)     Temp Source 06/12/16 2205 Oral     SpO2 06/12/16 2205 100 %     Weight 06/12/16 2206 180 lb (81.6 kg)     Height 06/12/16 2206 5\' 8"  (1.727 m)     Head Circumference --      Peak Flow --      Pain Score 06/12/16 2206 10     Pain Loc --      Pain Edu? --      Excl. in GC? --     Constitutional: Alert and oriented. Well appearing and in no acute distress. Eyes: Conjunctivae are normal. PERRL. EOMI. Head: Atraumatic. Nose: No congestion/rhinnorhea. Mouth/Throat: Mucous membranes are moist.  Oropharynx non-erythematous. Neck: No stridor.   Cardiovascular: Normal rate, regular rhythm. Grossly normal heart sounds.  Good peripheral circulation. Respiratory: Normal respiratory effort.  No retractions. Lungs CTAB. Gastrointestinal: Soft and nontender. No distention. No abdominal bruits. No CVA tenderness. Musculoskeletal: No lower extremity tenderness nor edema.  No joint effusions. Neurologic:  Normal speech and language. No gross focal neurologic deficits are appreciated. No gait instability. Skin:  Skin is warm, dry and intact. No rash noted. Psychiatric: Mood and affect are normal. Speech and behavior are normal.  ____________________________________________   LABS (all labs ordered are listed, but only abnormal results are displayed)  Labs Reviewed  WET PREP, GENITAL - Abnormal; Notable for the following:       Result Value   Clue Cells Wet Prep HPF POC PRESENT (*)    All other components within normal limits  URINALYSIS, COMPLETE (UACMP) WITH  MICROSCOPIC - Abnormal; Notable for the following:    Color, Urine YELLOW (*)    APPearance HAZY (*)    Protein, ur 30 (*)    Leukocytes, UA SMALL (*)    Bacteria, UA RARE (*)    Squamous Epithelial / LPF 6-30 (*)    All other components within normal limits  CHLAMYDIA/NGC RT PCR ([facility] ONLY)  POC URINE PREG, ED  POCT PREGNANCY, URINE   ____________________________________________  EKG  None ____________________________________________  RADIOLOGY  None ____________________________________________   PROCEDURES  Procedure(s) performed:   Pelvic exam: External exam WNL without rashes, lesions or vesicles. Speculum exam reveals mild white discharge. Cervical os closed. Cervix WNL. Bimanual exam WNL.  Procedures  Critical Care performed: No  ____________________________________________   INITIAL IMPRESSION / ASSESSMENT AND PLAN / ED COURSE  Pertinent labs & imaging results that were available during my care of the patient were reviewed by me and considered in my medical decision making (  see chart for details).  21 year old female who presents for STD exposure, specifically to gonorrhea and trichomonas. Will treat empirically. She is allergic to ceftriaxone as well as ciprofloxacin; after consultation with pharmacy, will administer IM gentamicin, azithromycin, and Flagyl. Strict return precautions given. Patient verbalizes understanding and agrees with plan of care.      ____________________________________________   FINAL CLINICAL IMPRESSION(S) / ED DIAGNOSES  Final diagnoses:  STD exposure  Bacterial vaginosis      NEW MEDICATIONS STARTED DURING THIS VISIT:  Discharge Medication List as of 06/13/2016  1:59 AM       Note:  This document was prepared using Dragon voice recognition software and may include unintentional dictation errors.    Irean Hong, MD 06/13/16 [phone]

## 2016-06-28 ENCOUNTER — Emergency Department
Admission: EM | Admit: 2016-06-28 | Discharge: 2016-06-28 | Discharge status: Against Medical Advice | Payer: BLUE CROSS/BLUE SHIELD | Attending: Emergency Medicine | Admitting: Emergency Medicine

## 2016-06-28 DIAGNOSIS — R109 Unspecified abdominal pain: Secondary | ICD-10-CM | POA: Diagnosis not present

## 2016-06-28 DIAGNOSIS — Z5321 Procedure and treatment not carried out due to patient leaving prior to being seen by health care provider: Secondary | ICD-10-CM | POA: Diagnosis not present

## 2016-06-28 NOTE — ED Notes (Signed)
Attempted to call patient X 2.  No answer. Pt was seen by tech walking outside. Checked bathroom. Cannot find pt.

## 2016-06-28 NOTE — ED Notes (Signed)
No dismiss button available. Will chart as discharge. Charge RN aware.

## 2016-07-05 ENCOUNTER — Encounter: Payer: Self-pay | Admitting: Emergency Medicine

## 2016-07-05 ENCOUNTER — Emergency Department: Payer: BLUE CROSS/BLUE SHIELD

## 2016-07-05 ENCOUNTER — Emergency Department
Admission: EM | Admit: 2016-07-05 | Discharge: 2016-07-06 | Disposition: A | Discharge status: Home / Self Care | Payer: BLUE CROSS/BLUE SHIELD | Attending: Emergency Medicine | Admitting: Emergency Medicine

## 2016-07-05 DIAGNOSIS — Z87891 Personal history of nicotine dependence: Secondary | ICD-10-CM | POA: Insufficient documentation

## 2016-07-05 DIAGNOSIS — R109 Unspecified abdominal pain: Secondary | ICD-10-CM | POA: Diagnosis present

## 2016-07-05 DIAGNOSIS — K5900 Constipation, unspecified: Secondary | ICD-10-CM | POA: Diagnosis not present

## 2016-07-05 DIAGNOSIS — R51 Headache: Secondary | ICD-10-CM | POA: Insufficient documentation

## 2016-07-05 DIAGNOSIS — Z79899 Other long term (current) drug therapy: Secondary | ICD-10-CM | POA: Diagnosis not present

## 2016-07-05 DIAGNOSIS — R1012 Left upper quadrant pain: Secondary | ICD-10-CM

## 2016-07-05 DIAGNOSIS — I10 Essential (primary) hypertension: Secondary | ICD-10-CM | POA: Insufficient documentation

## 2016-07-05 DIAGNOSIS — R519 Headache, unspecified: Secondary | ICD-10-CM

## 2016-07-05 LAB — URINE DRUG SCREEN, QUALITATIVE (ARMC ONLY)
Amphetamines, Ur Screen: NOT DETECTED
BARBITURATES, UR SCREEN: NOT DETECTED
BENZODIAZEPINE, UR SCRN: POSITIVE — AB
COCAINE METABOLITE, UR ~~LOC~~: NOT DETECTED
Cannabinoid 50 Ng, Ur ~~LOC~~: NOT DETECTED
MDMA (Ecstasy)Ur Screen: NOT DETECTED
METHADONE SCREEN, URINE: NOT DETECTED
OPIATE, UR SCREEN: NOT DETECTED
PHENCYCLIDINE (PCP) UR S: NOT DETECTED
Tricyclic, Ur Screen: POSITIVE — AB

## 2016-07-05 LAB — CBC
HCT: 41.1 % (ref 35.0–47.0)
Hemoglobin: 13.8 g/dL (ref 12.0–16.0)
MCH: 26.5 pg (ref 26.0–34.0)
MCHC: 33.6 g/dL (ref 32.0–36.0)
MCV: 78.8 fL — ABNORMAL LOW (ref 80.0–100.0)
PLATELETS: 322 10*3/uL (ref 150–440)
RBC: 5.22 MIL/uL — AB (ref 3.80–5.20)
RDW: 18.1 % — AB (ref 11.5–14.5)
WBC: 13.6 10*3/uL — AB (ref 3.6–11.0)

## 2016-07-05 LAB — LIPASE, BLOOD: LIPASE: 16 U/L (ref 11–51)

## 2016-07-05 LAB — COMPREHENSIVE METABOLIC PANEL
ALT: 24 U/L (ref 14–54)
AST: 22 U/L (ref 15–41)
Albumin: 4.6 g/dL (ref 3.5–5.0)
Alkaline Phosphatase: 111 U/L (ref 38–126)
Anion gap: 8 (ref 5–15)
BILIRUBIN TOTAL: 1 mg/dL (ref 0.3–1.2)
BUN: 11 mg/dL (ref 6–20)
CALCIUM: 9.6 mg/dL (ref 8.9–10.3)
CHLORIDE: 110 mmol/L (ref 101–111)
CO2: 24 mmol/L (ref 22–32)
CREATININE: 0.54 mg/dL (ref 0.44–1.00)
Glucose, Bld: 109 mg/dL — ABNORMAL HIGH (ref 65–99)
Potassium: 3.8 mmol/L (ref 3.5–5.1)
Sodium: 142 mmol/L (ref 135–145)
TOTAL PROTEIN: 8.3 g/dL — AB (ref 6.5–8.1)

## 2016-07-05 LAB — URINALYSIS, COMPLETE (UACMP) WITH MICROSCOPIC
BILIRUBIN URINE: NEGATIVE
GLUCOSE, UA: NEGATIVE mg/dL
Hgb urine dipstick: NEGATIVE
KETONES UR: NEGATIVE mg/dL
LEUKOCYTES UA: NEGATIVE
NITRITE: NEGATIVE
PH: 5 (ref 5.0–8.0)
Protein, ur: 30 mg/dL — AB
SPECIFIC GRAVITY, URINE: 1.027 (ref 1.005–1.030)

## 2016-07-05 LAB — POCT PREGNANCY, URINE: Preg Test, Ur: NEGATIVE

## 2016-07-05 MED ORDER — POLYETHYLENE GLYCOL 3350 17 G PO PACK: 17.0000 g | PACK | Freq: Every day | ORAL | 0 refills | Status: DC

## 2016-07-05 MED ORDER — DIPHENHYDRAMINE HCL 50 MG/ML IJ SOLN
12.5000 mg | Freq: Once | INTRAMUSCULAR | Status: AC
  Administered 2016-07-05: 12.5 mg via INTRAVENOUS
  Filled 2016-07-05: qty 1

## 2016-07-05 MED ORDER — SODIUM CHLORIDE 0.9 % IV BOLUS (SEPSIS)
1000.0000 mL | Freq: Once | INTRAVENOUS | Status: AC
  Administered 2016-07-05: 1000 mL via INTRAVENOUS

## 2016-07-05 MED ORDER — IBUPROFEN 800 MG PO TABS: 800.0000 mg | ORAL_TABLET | Freq: Three times a day (TID) | ORAL | 0 refills | Status: DC | PRN

## 2016-07-05 MED ORDER — METOCLOPRAMIDE HCL 5 MG/ML IJ SOLN
10.0000 mg | Freq: Once | INTRAMUSCULAR | Status: AC
  Administered 2016-07-05: 10 mg via INTRAVENOUS
  Filled 2016-07-05: qty 2

## 2016-07-05 NOTE — ED Notes (Signed)
Pt returned to ED Rm 19 from X-ray at this time. 

## 2016-07-05 NOTE — ED Provider Notes (Signed)
[facility]-EMERGENCY DEPARTMENT Provider Note   CSN: [id number] Arrival date & time: 07/05/16  1649     History   Chief Complaint Chief Complaint  Patient presents with  . Abdominal Pain    HPI Leslie Dalesiffany J Ettinger is a 21 y.o. female history of migraines, hypertension, urine tract infection, here presenting with left-sided abdominal pain, headaches. Patient states that she's been having left-sided abdominal pain for the last 2-3 months. States that the pain is progressively become more intense. Patient states that her last menstrual period was beginning of last month. Denies any vaginal bleeding currently or vaginal discharge. She was seen recently for possible STD exposure. Patient has been having progressive headaches as well that typical of her migraines. Denies any vomiting or weakness. Patient had cholecystectomy 6 months ago that was uncomplicated but denies any epigastric pain or right upper quadrant pain.  The history is provided by the patient.    Past Medical History:  Diagnosis Date  . Anxiety   . Depression   . History of migraine headaches   . Hx of chlamydia infection   . Hypertension   . Pregnancy induced hypertension   . UTI (lower urinary tract infection)    RESOLVED    Patient Active Problem List   Diagnosis Date Noted  . Biliary colic 12/20/2015  . Acute posthemorrhagic anemia 11/07/2015  . Gestational hypertension 11/01/2015  . Obesity (BMI 30-39.9) 09/27/2015  . Gallstones and inflammation of gallbladder without obstruction 05/25/2015  . Marijuana use, episodic 05/25/2015  . GBS bacteriuria 03/14/2015  . Maternal varicella, non-immune 03/10/2015  . Rubella non-immune status, antepartum 03/10/2015  . History of migraine headaches   . Anxiety   . Depression     Past Surgical History:  Procedure Laterality Date  . CHOLECYSTECTOMY N/A 12/20/2015   Procedure: LAPAROSCOPIC CHOLECYSTECTOMY;  Surgeon: Leafy Roiego F Pabon, MD;  Location: [facility] ORS;  Service: General;   Laterality: N/A;  . OTHER SURGICAL HISTORY     tubes ears 18 months  . PERINEAL LACERATION REPAIR N/A 11/03/2015   Procedure: SUTURE REPAIR PERINEAL LACERATION;  Surgeon: Hildred LaserAnika Cherry, MD;  Location: [facility] ORS;  Service: Gynecology;  Laterality: N/A;  . tubes in ears Bilateral     OB History    Gravida Para Term Preterm AB Living   1 1 1     1    SAB TAB Ectopic Multiple Live Births         0 1       Home Medications    Prior to Admission medications   Medication Sig Start Date End Date Taking? Authorizing Provider  docusate sodium (COLACE) 100 MG capsule Take 2 capsules (200 mg total) by mouth 2 (two) times daily. 03/02/16   Sharman CheekPhillip Stafford, MD  HYDROcodone-acetaminophen (NORCO/VICODIN) 5-325 MG tablet Take 1 tablet by mouth every 4 (four) hours as needed for moderate pain. Patient not taking: Reported on 12/20/2015 12/13/15   Lattie Hawichard E Cooper, MD  nitrofurantoin (MACRODANTIN) 100 MG capsule Take 1 capsule (100 mg total) by mouth 2 (two) times daily. 03/02/16   Sharman CheekPhillip Stafford, MD  oxyCODONE-acetaminophen (PERCOCET) 7.5-325 MG tablet Take 2 tablets by mouth every 4 (four) hours as needed for moderate pain. 12/21/15   Diego Ronnette JuniperF Pabon, MD  senna (SENOKOT) 8.6 MG TABS tablet Take 2 tablets (17.2 mg total) by mouth 2 (two) times daily. 03/02/16   Sharman CheekPhillip Stafford, MD    Family History Family History  Problem Relation Age of Onset  . Asthma Mother  as child  . Migraines Mother   . Thyroid disease Mother   . Cancer Father   . COPD Father   . Cancer Maternal Grandmother     colon  . Cancer Paternal Grandfather     lung  . Asthma Brother     Social History Social History  Substance Use Topics  . Smoking status: Former Smoker    Packs/day: 0.25    Years: 6.00    Types: Cigarettes    Quit date: 03/15/2015  . Smokeless tobacco: Never Used  . Alcohol use No     Allergies   Ceftriaxone sodium in dextrose and Ciprofloxacin   Review of Systems Review of Systems    Gastrointestinal: Positive for abdominal pain.  All other systems reviewed and are negative.    Physical Exam Updated Vital Signs BP 132/88 (BP Location: Right Arm)   Pulse 88   Temp 98.3 F (36.8 C) (Oral)   Resp 16   Ht 5\' 8"  (1.727 m)   Wt 180 lb (81.6 kg)   LMP 06/07/2016   SpO2 99%   BMI 27.37 kg/m   Physical Exam  Constitutional: She is oriented to person, place, and time. She appears well-developed and well-nourished.  HENT:  Head: Normocephalic.  Mouth/Throat: Oropharynx is clear and moist.  Eyes: EOM are normal. Pupils are equal, round, and reactive to light.  Neck: Normal range of motion. Neck supple.  Cardiovascular: Normal rate, regular rhythm and normal heart sounds.   Pulmonary/Chest: Effort normal and breath sounds normal. No respiratory distress. She has no wheezes. She has no rales.  Abdominal: Soft. Bowel sounds are normal.  Mild LUQ tenderness, no rebound. ? Mild L CVAT   Musculoskeletal: Normal range of motion.  Neurological: She is alert and oriented to person, place, and time. She displays normal reflexes. No cranial nerve deficit. Coordination normal.  Skin: Skin is warm.  Psychiatric: She has a normal mood and affect.  Nursing note and vitals reviewed.    ED Treatments / Results  Labs (all labs ordered are listed, but only abnormal results are displayed) Labs Reviewed  COMPREHENSIVE METABOLIC PANEL - Abnormal; Notable for the following:       Result Value   Glucose, Bld 109 (*)    Total Protein 8.3 (*)    All other components within normal limits  CBC - Abnormal; Notable for the following:    WBC 13.6 (*)    RBC 5.22 (*)    MCV 78.8 (*)    RDW 18.1 (*)    All other components within normal limits  URINALYSIS, COMPLETE (UACMP) WITH MICROSCOPIC - Abnormal; Notable for the following:    Color, Urine YELLOW (*)    APPearance HAZY (*)    Protein, ur 30 (*)    Bacteria, UA RARE (*)    Squamous Epithelial / LPF 0-5 (*)    All other  components within normal limits  URINE DRUG SCREEN, QUALITATIVE ([facility] ONLY) - Abnormal; Notable for the following:    Tricyclic, Ur Screen POSITIVE (*)    Benzodiazepine, Ur Scrn POSITIVE (*)    All other components within normal limits  LIPASE, BLOOD  POC URINE PREG, ED  POCT PREGNANCY, URINE    EKG  EKG Interpretation None       Radiology Dg Abd Acute W/chest  Result Date: 07/05/2016 CLINICAL DATA:  Abdominal pain for 4 months EXAM: DG ABDOMEN ACUTE W/ 1V CHEST COMPARISON:  09/10/2010 FINDINGS: Single-view chest demonstrates no acute infiltrate or effusion.  Normal heart size. Supine and upright views of the abdomen demonstrate no free air beneath the diaphragm. There are surgical clips in the right upper quadrant. There is a nonobstructed bowel gas pattern. There is a tiny 2 mm calcification in the left pelvis. IMPRESSION: 1. Single-view chest within normal limits 2. Nonobstructed gas pattern 3. Tiny 2 mm calcification left pelvis, suspect that this is a phlebolith, however if symptoms are suggestive of a ureteral stone, CT KUB could be obtained for further evaluation. Electronically Signed   By: Jasmine Pang M.D.   On: 07/05/2016 22:26    Procedures Procedures (including critical care time)  Medications Ordered in ED Medications  sodium chloride 0.9 % bolus 1,000 mL (1,000 mLs Intravenous New Bag/Given 07/05/16 2052)  metoCLOPramide (REGLAN) injection 10 mg (10 mg Intravenous Given 07/05/16 2052)  diphenhydrAMINE (BENADRYL) injection 12.5 mg (12.5 mg Intravenous Given 07/05/16 2052)     Initial Impression / Assessment and Plan / ED Course  I have reviewed the triage vital signs and the nursing notes.  Pertinent labs & imaging results that were available during my care of the patient were reviewed by me and considered in my medical decision making (see chart for details).    Lashaye SOLA MARGOLIS is a 21 y.o. female here with abdominal pain, migraines. Well appearing in the ED. Has  abdominal pain for several months and has minimal LUQ tenderness. Consider constipation vs pyelo. Had cholecystectomy in the past but has no RUQ or epigastric tenderness. Will get labs, LFTs, lipase, UA, pregnancy. Will give migraine cocktail    10:53 PM Labs unremarkable. UA showed no UTI or hematuria. UDS + tricyclic and benzos. She has hx of polysubstance abuse. xrays showed likely phlebolith. On my review, there is moderate constipation. Low suspicion for renal colic. She felt better with migraine cocktail. Will dc home with miralax, motrin  Final Clinical Impressions(s) / ED Diagnoses   Final diagnoses:  None    New Prescriptions New Prescriptions   No medications on file     Charlynne Pander, MD 07/05/16 2256

## 2016-07-05 NOTE — ED Triage Notes (Signed)
Patient presents to ED via POV from home with c/o abdominal pain x 4 months. Patient had her gallbladder removed 6 months ago. Patient c/o left sided abdominal pain and left sided headache. Denies N/V/D. A&O x4.

## 2016-07-05 NOTE — Discharge Instructions (Signed)
Take motrin for pain or headaches.   You are constipated. Take miralax daily until your stool is soft for a week.   See your doctor  Return to ER if you have worse abdominal pain, vomiting, worse headaches, fever.

## 2016-07-06 NOTE — ED Notes (Signed)
Patient discharge and follow up information reviewed with patient by ED nursing staff and patient given the opportunity to ask questions pertaining to ED visit and discharge plan of care. Patient advised that should symptoms not continue to improve, resolve entirely, or should new symptoms develop then a follow up visit with their PCP or a return visit to the ED may be warranted. Patient verbalized consent and understanding of discharge plan of care including potential need for further evaluation. Patient being discharged in stable condition per attending ED physician on duty.   Pt left without being discharge by this RN; discharge instructions reviewed by MD and pt verbalized understanding.

## 2016-07-06 NOTE — ED Notes (Signed)
Pt left without discharge papers; pt's IV was removed; discharge order had been placed, pt was told to stay until this RN had a chance to go over the discharge papers before leaving; when this RN returned to the room to discharge the pt, pt had already left the ED

## 2016-07-19 ENCOUNTER — Emergency Department
Admission: EM | Admit: 2016-07-19 | Discharge: 2016-07-19 | Disposition: A | Discharge status: Home / Self Care | Payer: BLUE CROSS/BLUE SHIELD | Attending: Emergency Medicine | Admitting: Emergency Medicine

## 2016-07-19 ENCOUNTER — Encounter: Payer: Self-pay | Admitting: Emergency Medicine

## 2016-07-19 ENCOUNTER — Emergency Department: Payer: BLUE CROSS/BLUE SHIELD

## 2016-07-19 DIAGNOSIS — Y999 Unspecified external cause status: Secondary | ICD-10-CM | POA: Insufficient documentation

## 2016-07-19 DIAGNOSIS — S0990XA Unspecified injury of head, initial encounter: Secondary | ICD-10-CM | POA: Diagnosis present

## 2016-07-19 DIAGNOSIS — Y929 Unspecified place or not applicable: Secondary | ICD-10-CM | POA: Diagnosis not present

## 2016-07-19 DIAGNOSIS — Y9389 Activity, other specified: Secondary | ICD-10-CM | POA: Insufficient documentation

## 2016-07-19 DIAGNOSIS — I10 Essential (primary) hypertension: Secondary | ICD-10-CM | POA: Diagnosis not present

## 2016-07-19 DIAGNOSIS — Z87891 Personal history of nicotine dependence: Secondary | ICD-10-CM | POA: Insufficient documentation

## 2016-07-19 DIAGNOSIS — Z79899 Other long term (current) drug therapy: Secondary | ICD-10-CM | POA: Insufficient documentation

## 2016-07-19 DIAGNOSIS — S0083XA Contusion of other part of head, initial encounter: Secondary | ICD-10-CM | POA: Diagnosis not present

## 2016-07-19 DIAGNOSIS — T07XXXA Unspecified multiple injuries, initial encounter: Secondary | ICD-10-CM

## 2016-07-19 MED ORDER — IBUPROFEN 400 MG PO TABS
ORAL_TABLET | ORAL | Status: AC
  Administered 2016-07-19: 400 mg via ORAL
  Filled 2016-07-19: qty 1

## 2016-07-19 MED ORDER — ACETAMINOPHEN 500 MG PO TABS
ORAL_TABLET | ORAL | Status: AC
  Administered 2016-07-19: 1000 mg via ORAL
  Filled 2016-07-19: qty 2

## 2016-07-19 MED ORDER — ACETAMINOPHEN 500 MG PO TABS
1000.0000 mg | ORAL_TABLET | Freq: Once | ORAL | Status: AC
  Administered 2016-07-19: 1000 mg via ORAL

## 2016-07-19 MED ORDER — IBUPROFEN 400 MG PO TABS
400.0000 mg | ORAL_TABLET | Freq: Once | ORAL | Status: AC
  Administered 2016-07-19: 400 mg via ORAL
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
## 2016-07-19 NOTE — ED Notes (Signed)
Abrasion noted to left forehead above eye with swelling, pt also c/o bruising around left ear.

## 2016-07-20 ENCOUNTER — Emergency Department: Payer: BLUE CROSS/BLUE SHIELD

## 2016-07-20 ENCOUNTER — Emergency Department
Admission: EM | Admit: 2016-07-20 | Discharge: 2016-07-20 | Disposition: A | Discharge status: Home / Self Care | Payer: BLUE CROSS/BLUE SHIELD | Attending: Emergency Medicine | Admitting: Emergency Medicine

## 2016-07-20 DIAGNOSIS — M549 Dorsalgia, unspecified: Secondary | ICD-10-CM | POA: Diagnosis not present

## 2016-07-20 DIAGNOSIS — Z87891 Personal history of nicotine dependence: Secondary | ICD-10-CM | POA: Insufficient documentation

## 2016-07-20 DIAGNOSIS — Y999 Unspecified external cause status: Secondary | ICD-10-CM | POA: Diagnosis not present

## 2016-07-20 DIAGNOSIS — M542 Cervicalgia: Secondary | ICD-10-CM | POA: Diagnosis not present

## 2016-07-20 DIAGNOSIS — Y939 Activity, unspecified: Secondary | ICD-10-CM | POA: Insufficient documentation

## 2016-07-20 DIAGNOSIS — R51 Headache: Secondary | ICD-10-CM | POA: Insufficient documentation

## 2016-07-20 DIAGNOSIS — I10 Essential (primary) hypertension: Secondary | ICD-10-CM | POA: Insufficient documentation

## 2016-07-20 DIAGNOSIS — R519 Headache, unspecified: Secondary | ICD-10-CM

## 2016-07-20 DIAGNOSIS — Y92009 Unspecified place in unspecified non-institutional (private) residence as the place of occurrence of the external cause: Secondary | ICD-10-CM | POA: Insufficient documentation

## 2016-07-20 DIAGNOSIS — S0990XA Unspecified injury of head, initial encounter: Secondary | ICD-10-CM | POA: Diagnosis present

## 2016-07-20 MED ORDER — SODIUM CHLORIDE 0.9 % IV BOLUS (SEPSIS)
1000.0000 mL | Freq: Once | INTRAVENOUS | Status: AC
  Administered 2016-07-20: 1000 mL via INTRAVENOUS

## 2016-07-20 MED ORDER — DIPHENHYDRAMINE HCL 50 MG/ML IJ SOLN
25.0000 mg | Freq: Once | INTRAMUSCULAR | Status: AC
  Administered 2016-07-20: 25 mg via INTRAVENOUS
  Filled 2016-07-20: qty 1

## 2016-07-20 MED ORDER — NAPROXEN 500 MG PO TABS: 500.0000 mg | ORAL_TABLET | Freq: Two times a day (BID) | ORAL | 0 refills | Status: DC | PRN

## 2016-07-20 MED ORDER — METOCLOPRAMIDE HCL 5 MG/ML IJ SOLN
10.0000 mg | Freq: Once | INTRAMUSCULAR | Status: AC
  Administered 2016-07-20: 10 mg via INTRAVENOUS
  Filled 2016-07-20: qty 2

## 2016-07-20 MED ORDER — KETOROLAC TROMETHAMINE 30 MG/ML IJ SOLN
10.0000 mg | Freq: Once | INTRAMUSCULAR | Status: AC
  Administered 2016-07-20: 9.9 mg via INTRAVENOUS
  Filled 2016-07-20: qty 1
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
## 2016-09-07 ENCOUNTER — Encounter: Payer: Self-pay | Admitting: Emergency Medicine

## 2016-09-07 ENCOUNTER — Emergency Department
Admission: EM | Admit: 2016-09-07 | Discharge: 2016-09-07 | Disposition: A | Discharge status: Home / Self Care | Payer: BLUE CROSS/BLUE SHIELD | Attending: Emergency Medicine | Admitting: Emergency Medicine

## 2016-09-07 DIAGNOSIS — I10 Essential (primary) hypertension: Secondary | ICD-10-CM | POA: Insufficient documentation

## 2016-09-07 DIAGNOSIS — Z5181 Encounter for therapeutic drug level monitoring: Secondary | ICD-10-CM | POA: Diagnosis not present

## 2016-09-07 DIAGNOSIS — F1910 Other psychoactive substance abuse, uncomplicated: Secondary | ICD-10-CM | POA: Diagnosis present

## 2016-09-07 DIAGNOSIS — F17210 Nicotine dependence, cigarettes, uncomplicated: Secondary | ICD-10-CM | POA: Diagnosis not present

## 2016-09-07 LAB — ACETAMINOPHEN LEVEL: Acetaminophen (Tylenol), Serum: <10 ug/mL — ABNORMAL LOW (ref 10–30)

## 2016-09-07 LAB — URINE DRUG SCREEN, QUALITATIVE (ARMC ONLY)
Amphetamines, Ur Screen: NOT DETECTED
BARBITURATES, UR SCREEN: NOT DETECTED
BENZODIAZEPINE, UR SCRN: POSITIVE — AB
CANNABINOID 50 NG, UR ~~LOC~~: POSITIVE — AB
Cocaine Metabolite,Ur ~~LOC~~: POSITIVE — AB
MDMA (Ecstasy)Ur Screen: NOT DETECTED
Methadone Scn, Ur: NOT DETECTED
OPIATE, UR SCREEN: NOT DETECTED
PHENCYCLIDINE (PCP) UR S: NOT DETECTED
Tricyclic, Ur Screen: NOT DETECTED

## 2016-09-07 LAB — CBC
HEMATOCRIT: 38.7 % (ref 35.0–47.0)
Hemoglobin: 13 g/dL (ref 12.0–16.0)
MCH: 28.4 pg (ref 26.0–34.0)
MCHC: 33.7 g/dL (ref 32.0–36.0)
MCV: 84.3 fL (ref 80.0–100.0)
PLATELETS: 288 10*3/uL (ref 150–440)
RBC: 4.59 MIL/uL (ref 3.80–5.20)
RDW: 16.7 % — AB (ref 11.5–14.5)
WBC: 12.7 10*3/uL — ABNORMAL HIGH (ref 3.6–11.0)

## 2016-09-07 LAB — SALICYLATE LEVEL: Salicylate Lvl: <7 mg/dL (ref 2.8–30.0)

## 2016-09-07 LAB — COMPREHENSIVE METABOLIC PANEL
ALBUMIN: 4.2 g/dL (ref 3.5–5.0)
ALK PHOS: 102 U/L (ref 38–126)
ALT: 17 U/L (ref 14–54)
AST: 23 U/L (ref 15–41)
Anion gap: 8 (ref 5–15)
BUN: 20 mg/dL (ref 6–20)
CO2: 25 mmol/L (ref 22–32)
CREATININE: 0.89 mg/dL (ref 0.44–1.00)
Calcium: 9.6 mg/dL (ref 8.9–10.3)
Chloride: 110 mmol/L (ref 101–111)
GFR calc Af Amer: >60 mL/min (ref >60)
GFR calc non Af Amer: >60 mL/min (ref >60)
GLUCOSE: 112 mg/dL — AB (ref 65–99)
Potassium: 3.9 mmol/L (ref 3.5–5.1)
Sodium: 143 mmol/L (ref 135–145)
Total Bilirubin: 0.7 mg/dL (ref 0.3–1.2)
Total Protein: 7.5 g/dL (ref 6.5–8.1)

## 2016-09-07 LAB — ETHANOL: Alcohol, Ethyl (B): <5 mg/dL (ref <5)

## 2016-09-07 LAB — PREGNANCY, URINE: Preg Test, Ur: NEGATIVE

## 2016-09-07 NOTE — Discharge Instructions (Signed)
You have been seen in the Emergency Department (ED)  today for psychiatric issues.  You have been evaluated by the behavioral medicine specialists and are being referred to: ° °[facility] °[address] °[place], [place] [address] °Phone:  [phone] or [phone] ° °Open Access:   °Walk-in ASSESSMENT hours, M-W-F, 8:00am - 3:00pm °Advanced Acess CRISIS:  M-F, 8:00am - 8:00pm °Outpatient Services Office Hours:  M-F, 8:00am - 5:00pm ° °Please return to the Emergency Department (ED)  immediately if you have ANY thoughts of hurting yourself or anyone else, so that we may help you. ° °Please avoid alcohol and drug use. ° °Follow up with your doctor and/or therapist as soon as possible regarding today's ED  visit.   Please follow up any other recommendations and clinic appointments provided by the psychiatry team that saw you in the Emergency Department. ° °
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
## 2016-09-07 NOTE — ED Provider Notes (Signed)
Patient is pleasant, resting comfortably in no distress. Seen and evaluated by TTS. She is not able to be placed in a outpatient facility at this time, but [facility]. She is agreeable with plan for outpatient follow-up and denies any desire to harm herself or anyone else. She understands the risks of her drug use is agreeable to close follow up with [facility] and abstinence from substance abuse in the interim.  She is awake alert, able to ambulate on her own with no difficulty. Stable hemodynamics   Sharyn CreamerQuale, Mark, MD 09/07/16 1235

## 2016-09-07 NOTE — BH Assessment (Signed)
Discussed patient with ER MD (Dr. Fanny BienQuale) and patient is able to discharge home when medically cleared. Patient was giving referral information and instructions on how to follow up with Outpatient Treatment ([facility] and [facility] and [facility]  Patient denies SI/HI and AV/H.  Writer contacted several facilities for inpatient substance treatment.  [facility]-No beds   [facility]-No beds   [facility] (no longer accept BCBS)

## 2016-09-07 NOTE — ED Notes (Signed)

## 2016-09-07 NOTE — ED Provider Notes (Signed)
[facility] Emergency Department Provider Note  ____________________________________________   First MD Initiated Contact with Patient 09/07/16 [phone]     (approximate)  I have reviewed the triage vital signs and the nursing notes.   HISTORY  Chief Complaint Addiction Problem   HPI Shelley Graham is a 21 y.o. female with a history of polysubstance abuse who is presenting to the emergency department today requesting detox/rehabilitation. The patient says that she will take "anything I get my hands on." She says that she snorted meth and heroin. Denies any IV drug use. Denies any suicidal or homicidal ideation.   Past Medical History:  Diagnosis Date  . Anxiety   . Depression   . History of migraine headaches   . Hx of chlamydia infection   . Hypertension   . Pregnancy induced hypertension   . UTI (lower urinary tract infection)    RESOLVED    Patient Active Problem List   Diagnosis Date Noted  . Biliary colic 12/20/2015  . Acute posthemorrhagic anemia 11/07/2015  . Gestational hypertension 11/01/2015  . Obesity (BMI 30-39.9) 09/27/2015  . Gallstones and inflammation of gallbladder without obstruction 05/25/2015  . Marijuana use, episodic 05/25/2015  . GBS bacteriuria 03/14/2015  . Maternal varicella, non-immune 03/10/2015  . Rubella non-immune status, antepartum 03/10/2015  . History of migraine headaches   . Anxiety   . Depression     Past Surgical History:  Procedure Laterality Date  . CHOLECYSTECTOMY N/A 12/20/2015   Procedure: LAPAROSCOPIC CHOLECYSTECTOMY;  Surgeon: Leafy Ro, MD;  Location: [facility] ORS;  Service: General;  Laterality: N/A;  . OTHER SURGICAL HISTORY     tubes ears 18 months  . PERINEAL LACERATION REPAIR N/A 11/03/2015   Procedure: SUTURE REPAIR PERINEAL LACERATION;  Surgeon: Hildred Laser, MD;  Location: [facility] ORS;  Service: Gynecology;  Laterality: N/A;  . tubes in ears Bilateral     Prior to Admission medications     Medication Sig Start Date End Date Taking? Authorizing Provider  docusate sodium (COLACE) 100 MG capsule Take 2 capsules (200 mg total) by mouth 2 (two) times daily. 03/02/16   Sharman Cheek, MD  HYDROcodone-acetaminophen (NORCO/VICODIN) 5-325 MG tablet Take 1 tablet by mouth every 4 (four) hours as needed for moderate pain. Patient not taking: Reported on 12/20/2015 12/13/15   Lattie Haw, MD  ibuprofen (ADVIL,MOTRIN) 800 MG tablet Take 1 tablet (800 mg total) by mouth every 8 (eight) hours as needed. 07/05/16   Charlynne Pander, MD  naproxen (NAPROSYN) 500 MG tablet Take 1 tablet (500 mg total) by mouth 2 (two) times daily as needed. 07/20/16 07/20/17  Nita Sickle, MD  nitrofurantoin (MACRODANTIN) 100 MG capsule Take 1 capsule (100 mg total) by mouth 2 (two) times daily. 03/02/16   Sharman Cheek, MD  oxyCODONE-acetaminophen (PERCOCET) 7.5-325 MG tablet Take 2 tablets by mouth every 4 (four) hours as needed for moderate pain. 12/21/15   Pabon, Diego F, MD  polyethylene glycol (MIRALAX / GLYCOLAX) packet Take 17 g by mouth daily. 07/05/16   Charlynne Pander, MD  senna (SENOKOT) 8.6 MG TABS tablet Take 2 tablets (17.2 mg total) by mouth 2 (two) times daily. 03/02/16   Sharman Cheek, MD    Allergies Ceftriaxone sodium in dextrose and Ciprofloxacin  Family History  Problem Relation Age of Onset  . Asthma Mother        as child  . Migraines Mother   . Thyroid disease Mother   . Cancer Father   .  COPD Father   . Cancer Maternal Grandmother        colon  . Cancer Paternal Grandfather        lung  . Asthma Brother     Social History Social History  Substance Use Topics  . Smoking status: Current Every Day Smoker    Packs/day: 1.00    Years: 6.00    Types: Cigarettes    Last attempt to quit: 03/15/2015  . Smokeless tobacco: Never Used  . Alcohol use 0.0 oz/week    Review of Systems  Constitutional: No fever/chills Eyes: No visual changes. ENT: No sore  throat. Cardiovascular: Denies chest pain. Respiratory: Denies shortness of breath. Gastrointestinal: No abdominal pain.  No nausea, no vomiting.  No diarrhea.  No constipation. Genitourinary: Negative for dysuria. Musculoskeletal: Negative for back pain. Skin: Negative for rash. Neurological: Negative for headaches, focal weakness or numbness.   ____________________________________________   PHYSICAL EXAM:  VITAL SIGNS: ED Triage Vitals  Enc Vitals Group     BP 09/07/16 0536 129/76     Pulse Rate 09/07/16 0536 91     Resp 09/07/16 0536 18     Temp 09/07/16 0536 98.9 F (37.2 C)     Temp Source 09/07/16 0536 Oral     SpO2 09/07/16 0536 94 %     Weight 09/07/16 0537 160 lb (72.6 kg)     Height 09/07/16 0537 5\' 8"  (1.727 m)     Head Circumference --      Peak Flow --      Pain Score 09/07/16 0536 0     Pain Loc --      Pain Edu? --      Excl. in GC? --     Constitutional: Alert and oriented. Well appearing and in no acute distress. Eyes: Conjunctivae are normal. PERRL. EOMI. Head: Atraumatic. Nose: No congestion/rhinnorhea. Mouth/Throat: Mucous membranes are moist.  Neck: No stridor.   Cardiovascular: Normal rate, regular rhythm. Grossly normal heart sounds.  Respiratory: Normal respiratory effort.  No retractions. Lungs CTAB. Gastrointestinal: Soft and nontender. No distention.  Musculoskeletal: No lower extremity tenderness nor edema.  No joint effusions. Neurologic:  Normal speech and language. No gross focal neurologic deficits are appreciated. Skin:  Skin is warm, dry and intact. No rash noted. Psychiatric: Mood and affect are normal. Speech and behavior are normal.  ____________________________________________   LABS (all labs ordered are listed, but only abnormal results are displayed)  Labs Reviewed  COMPREHENSIVE METABOLIC PANEL - Abnormal; Notable for the following:       Result Value   Glucose, Bld 112 (*)    All other components within normal limits   CBC - Abnormal; Notable for the following:    WBC 12.7 (*)    RDW 16.7 (*)    All other components within normal limits  URINE DRUG SCREEN, QUALITATIVE ([facility] ONLY) - Abnormal; Notable for the following:    Cocaine Metabolite,Ur [place] POSITIVE (*)    Cannabinoid 50 Ng, Ur [place] POSITIVE (*)    Benzodiazepine, Ur [place] POSITIVE (*)    All other components within normal limits  ETHANOL  SALICYLATE LEVEL  ACETAMINOPHEN LEVEL  PREGNANCY, URINE   ____________________________________________  EKG   ____________________________________________  RADIOLOGY   ____________________________________________   PROCEDURES  Procedure(s) performed:   Procedures  Critical Care performed:   ____________________________________________   INITIAL IMPRESSION / ASSESSMENT AND PLAN / ED COURSE  Pertinent labs & imaging results that were available during my care of the patient were  reviewed by me and considered in my medical decision making (see chart for details).  TTS constipation ordered. The patient were the plan.      ____________________________________________   FINAL CLINICAL IMPRESSION(S) / ED DIAGNOSES  Polysubstance abuse.    NEW MEDICATIONS STARTED DURING THIS VISIT:  New Prescriptions   No medications on file     Note:  This document was prepared using Dragon voice recognition software and may include unintentional dictation errors.    Myrna BlazerSchaevitz, Tawna Alwin Matthew, MD 09/07/16 [phone]

## 2016-09-07 NOTE — ED Triage Notes (Signed)
Pt presents to ED via [facility] with c/o drug dependency and depression. Pt states that she "wants help". Pt denies SI or HI at this time and is in NAD.

## 2016-09-07 NOTE — ED Notes (Signed)
Given meal tray.

## 2016-09-07 NOTE — ED Notes (Signed)
All belonging returned to patient.

## 2016-09-08 ENCOUNTER — Encounter: Payer: Self-pay | Admitting: Intensive Care

## 2016-09-08 ENCOUNTER — Emergency Department
Admission: EM | Admit: 2016-09-08 | Discharge: 2016-09-10 | Disposition: A | Discharge status: Other Acute Inpatient Hospital | Payer: BLUE CROSS/BLUE SHIELD | Attending: Student in an Organized Health Care Education/Training Program | Admitting: Student in an Organized Health Care Education/Training Program

## 2016-09-08 DIAGNOSIS — F332 Major depressive disorder, recurrent severe without psychotic features: Secondary | ICD-10-CM

## 2016-09-08 DIAGNOSIS — F1490 Cocaine use, unspecified, uncomplicated: Secondary | ICD-10-CM | POA: Diagnosis not present

## 2016-09-08 DIAGNOSIS — F17210 Nicotine dependence, cigarettes, uncomplicated: Secondary | ICD-10-CM | POA: Insufficient documentation

## 2016-09-08 DIAGNOSIS — F329 Major depressive disorder, single episode, unspecified: Secondary | ICD-10-CM | POA: Insufficient documentation

## 2016-09-08 DIAGNOSIS — F32A Depression, unspecified: Secondary | ICD-10-CM

## 2016-09-08 DIAGNOSIS — F1290 Cannabis use, unspecified, uncomplicated: Secondary | ICD-10-CM | POA: Insufficient documentation

## 2016-09-08 DIAGNOSIS — I10 Essential (primary) hypertension: Secondary | ICD-10-CM | POA: Diagnosis not present

## 2016-09-08 DIAGNOSIS — R45851 Suicidal ideations: Secondary | ICD-10-CM

## 2016-09-08 LAB — POCT PREGNANCY, URINE: PREG TEST UR: NEGATIVE

## 2016-09-08 LAB — CBC
HCT: 39.1 % (ref 35.0–47.0)
Hemoglobin: 13 g/dL (ref 12.0–16.0)
MCH: 27.6 pg (ref 26.0–34.0)
MCHC: 33.2 g/dL (ref 32.0–36.0)
MCV: 83 fL (ref 80.0–100.0)
Platelets: 271 10*3/uL (ref 150–440)
RBC: 4.71 MIL/uL (ref 3.80–5.20)
RDW: 16.8 % — ABNORMAL HIGH (ref 11.5–14.5)
WBC: 14.1 10*3/uL — AB (ref 3.6–11.0)

## 2016-09-08 LAB — COMPREHENSIVE METABOLIC PANEL
ALBUMIN: 4.1 g/dL (ref 3.5–5.0)
ALT: 17 U/L (ref 14–54)
ANION GAP: 7 (ref 5–15)
AST: 18 U/L (ref 15–41)
Alkaline Phosphatase: 100 U/L (ref 38–126)
BILIRUBIN TOTAL: 1.1 mg/dL (ref 0.3–1.2)
BUN: 18 mg/dL (ref 6–20)
CHLORIDE: 108 mmol/L (ref 101–111)
CO2: 25 mmol/L (ref 22–32)
Calcium: 9.2 mg/dL (ref 8.9–10.3)
Creatinine, Ser: 0.78 mg/dL (ref 0.44–1.00)
GFR calc Af Amer: >60 mL/min (ref >60)
Glucose, Bld: 88 mg/dL (ref 65–99)
POTASSIUM: 3.8 mmol/L (ref 3.5–5.1)
Sodium: 140 mmol/L (ref 135–145)
TOTAL PROTEIN: 7.3 g/dL (ref 6.5–8.1)

## 2016-09-08 LAB — URINALYSIS, COMPLETE (UACMP) WITH MICROSCOPIC
Bilirubin Urine: NEGATIVE
GLUCOSE, UA: NEGATIVE mg/dL
HGB URINE DIPSTICK: NEGATIVE
Ketones, ur: NEGATIVE mg/dL
Leukocytes, UA: NEGATIVE
Nitrite: NEGATIVE
PH: 5 (ref 5.0–8.0)
PROTEIN: NEGATIVE mg/dL
SPECIFIC GRAVITY, URINE: 1.003 — AB (ref 1.005–1.030)

## 2016-09-08 LAB — URINE DRUG SCREEN, QUALITATIVE (ARMC ONLY)
AMPHETAMINES, UR SCREEN: NOT DETECTED
BARBITURATES, UR SCREEN: NOT DETECTED
Benzodiazepine, Ur Scrn: NOT DETECTED
Cannabinoid 50 Ng, Ur ~~LOC~~: POSITIVE — AB
Cocaine Metabolite,Ur ~~LOC~~: NOT DETECTED
MDMA (Ecstasy)Ur Screen: NOT DETECTED
METHADONE SCREEN, URINE: NOT DETECTED
OPIATE, UR SCREEN: NOT DETECTED
Phencyclidine (PCP) Ur S: NOT DETECTED
TRICYCLIC, UR SCREEN: NOT DETECTED

## 2016-09-08 LAB — ETHANOL

## 2016-09-08 LAB — SALICYLATE LEVEL: Salicylate Lvl: <7 mg/dL (ref 2.8–30.0)

## 2016-09-08 LAB — ACETAMINOPHEN LEVEL: Acetaminophen (Tylenol), Serum: <10 ug/mL — ABNORMAL LOW (ref 10–30)

## 2016-09-08 MED ORDER — HYDROXYZINE HCL 25 MG PO TABS
50.0000 mg | ORAL_TABLET | Freq: Every evening | ORAL | Status: DC | PRN
  Administered 2016-09-09: 50 mg via ORAL
  Filled 2016-09-08: qty 2

## 2016-09-08 MED ORDER — SERTRALINE HCL 50 MG PO TABS
25.0000 mg | ORAL_TABLET | Freq: Every day | ORAL | Status: DC
  Administered 2016-09-09: 25 mg via ORAL
  Filled 2016-09-08: qty 1

## 2016-09-08 MED ORDER — ACETAMINOPHEN 500 MG PO TABS
ORAL_TABLET | ORAL | Status: AC
  Administered 2016-09-08: 1000 mg via ORAL
  Filled 2016-09-08: qty 2

## 2016-09-08 MED ORDER — ACETAMINOPHEN 500 MG PO TABS
1000.0000 mg | ORAL_TABLET | Freq: Once | ORAL | Status: AC
  Administered 2016-09-08: 1000 mg via ORAL

## 2016-09-08 NOTE — BH Assessment (Addendum)
Tele Assessment Note   Shelley Graham is a 21 year old female that reports SI without a plan.  However, documentation in the epic chart reports that the patient states she plans to walk out in front of traffic or drink a gallon of bleach.  Patient reports hearing voices but no one else can hear the voices.  Patient denies compliance with taking her psychiatric medication.   Patient reports increased depression associated with her mother receiving temporary custody of her 10 month child.    Patient reports that she is addicted to cocaine.  Patient reports that she uses every day and the quantity varies.  Patient is currently on probation due to selling drugs.  Patient has a court date on September 27, 2016 for a probation violation of testing positive for cocaine.  Patient denies prior suicide attempts.  Patient denies prior inpatient psychiatric hospitalizations.  Patient denies HI.   Per [facility] - patient meets criteria for inpatient hospitalization.   Diagnosis: Major Depressive Disorder   Past Medical History:  Past Medical History:  Diagnosis Date  . Anxiety   . Depression   . History of migraine headaches   . Hx of chlamydia infection   . Hypertension   . Pregnancy induced hypertension   . UTI (lower urinary tract infection)    RESOLVED    Past Surgical History:  Procedure Laterality Date  . CHOLECYSTECTOMY N/A 12/20/2015   Procedure: LAPAROSCOPIC CHOLECYSTECTOMY;  Surgeon: Leafy Ro, MD;  Location: [facility] ORS;  Service: General;  Laterality: N/A;  . OTHER SURGICAL HISTORY     tubes ears 18 months  . PERINEAL LACERATION REPAIR N/A 11/03/2015   Procedure: SUTURE REPAIR PERINEAL LACERATION;  Surgeon: Hildred Laser, MD;  Location: [facility] ORS;  Service: Gynecology;  Laterality: N/A;  . tubes in ears Bilateral     Family History:  Family History  Problem Relation Age of Onset  . Asthma Mother        as child  . Migraines Mother   . Thyroid disease Mother   . Cancer Father   . COPD Father    . Cancer Maternal Grandmother        colon  . Cancer Paternal Grandfather        lung  . Asthma Brother     Social History:  reports that she has been smoking Cigarettes.  She has a 6.00 pack-year smoking history. She has never used smokeless tobacco. She reports that she drinks alcohol. She reports that she uses drugs, including Benzodiazepines, Marijuana, and Cocaine.  Additional Social History:  Alcohol / Drug Use History of alcohol / drug use?: Yes Longest period of sobriety (when/how long): While she was pregnant  Negative Consequences of Use: Financial, Legal, Personal relationships Withdrawal Symptoms:  (None Reported) Substance #1 Name of Substance 1: Cocaine 1 - Age of First Use: 21yo 1 - Amount (size/oz): Varies 1 - Frequency: Daily 1 - Duration: For the past year 1 - Last Use / Amount: Last night   CIWA: CIWA-Ar BP: (!) 143/94 Pulse Rate: 77 COWS:    PATIENT STRENGTHS: (choose at least two) Average or above average intelligence Capable of independent living Communication skills Motivation for treatment/growth  Allergies:  Allergies  Allergen Reactions  . Ceftriaxone Sodium In Dextrose Rash    Reaction occurred immediately after starting IV Ceftriaxone and medication was stopped directly after symptoms began.   . Ciprofloxacin Hives and Rash    Home Medications:  (Not in a hospital admission)  OB/GYN Status:  Patient's last menstrual period was 09/07/2016 (exact date).  General Assessment Data [facility] TTS Assessment: In system Is this a Tele or Face-to-Face Assessment?: Tele Assessment Is this an Initial Assessment or a Re-assessment for this encounter?: Initial Assessment Marital status: Single Maiden name: NA Is patient pregnant?: No Pregnancy Status: No Living Arrangements: Other (Comment) (Homeless) Can pt return to current living arrangement?: Yes Admission Status: Involuntary Is patient capable of signing voluntary  admission?: No Referral Source: Self/Family/Friend Insurance type: BC/B  Medical Screening Exam [facility] Walk-in ONLY) Medical Exam completed:  (NA)  Crisis Care Plan Living Arrangements: Other (Comment) (Homeless) Legal Guardian:  (NA) Name of Psychiatrist:  (None Reported) Name of Therapist: None Reported  Education Status Is patient currently in school?: No Current Grade: NA Highest grade of school patient has completed: NA Name of school: NA Contact person: NA  Risk to self with the past 6 months Suicidal Ideation: Yes-Currently Present Has patient been a risk to self within the past 6 months prior to admission? : Yes Suicidal Intent: Yes-Currently Present Has patient had any suicidal intent within the past 6 months prior to admission? : Yes Is patient at risk for suicide?: Yes Suicidal Plan?: No Has patient had any suicidal plan within the past 6 months prior to admission? : No Access to Means: No What has been your use of drugs/alcohol within the last 12 months?: None Reported Previous Attempts/Gestures: No How many times?: 0 Other Self Harm Risks: NA Triggers for Past Attempts:  (Lost custody of her daughter temp; Unable to stop using drug) Intentional Self Injurious Behavior: None Family Suicide History: No Recent stressful life event(s): Job Loss, Conflict (Comment), Financial Problems Persecutory voices/beliefs?: Yes Depression: Yes Depression Symptoms: Despondent, Tearfulness, Fatigue, Loss of interest in usual pleasures, Feeling worthless/self pity Substance abuse history and/or treatment for substance abuse?: Yes Suicide prevention information given to non-admitted patients: Yes  Risk to Others within the past 6 months Homicidal Ideation: No Does patient have any lifetime risk of violence toward others beyond the six months prior to admission? : No Thoughts of Harm to Others: No Current Homicidal Intent: No Current Homicidal Plan: No Access to Homicidal  Means: No Identified Victim: NA History of harm to others?: No Assessment of Violence: None Noted Violent Behavior Description: None Reported Does patient have access to weapons?: No Criminal Charges Pending?: Yes Does patient have a court date: Yes Court Date: 09/27/16 Is patient on probation?: Yes  Psychosis Hallucinations: Auditory Delusions: None noted  Mental Status Report Appearance/Hygiene: Unremarkable, In scrubs Eye Contact: Good Motor Activity: Freedom of movement, Restlessness Speech: Logical/coherent Level of Consciousness: Alert Mood: Depressed, Anxious Affect: Appropriate to circumstance, Depressed Anxiety Level: Minimal Thought Processes: Coherent, Relevant Judgement: Impaired Orientation: Person, Place, Time, Situation, Appropriate for developmental age Obsessive Compulsive Thoughts/Behaviors: None  Cognitive Functioning Concentration: Decreased Memory: Recent Intact, Remote Intact IQ: Average Insight: Poor Impulse Control: Poor Appetite: Fair Weight Loss: 0 Weight Gain: 0 Sleep: Decreased Total Hours of Sleep: 4 Vegetative Symptoms: Decreased grooming, Not bathing, Staying in bed  ADLScreening [facility] Assessment Services) Patient's cognitive ability adequate to safely complete daily activities?: Yes Patient able to express need for assistance with ADLs?: Yes Independently performs ADLs?: Yes (appropriate for developmental age)  Prior Inpatient Therapy Prior Inpatient Therapy: No Prior Therapy Dates:  (NA) Prior Therapy Facilty/Provider(s): NA Reason for Treatment: NA  Prior Outpatient Therapy Prior Outpatient Therapy: No Prior Therapy Dates: NA Prior Therapy Facilty/Provider(s): NA Reason for Treatment: NA Does patient  have an ACCT team?: No Does patient have Intensive In-House Services?  : No Does patient have [facility] services? : No Does patient have P4CC services?: No  ADL Screening (condition at time of admission) Patient's cognitive  ability adequate to safely complete daily activities?: Yes Is the patient deaf or have difficulty hearing?: No Does the patient have difficulty seeing, even when wearing glasses/contacts?: No Does the patient have difficulty concentrating, remembering, or making decisions?: Yes Patient able to express need for assistance with ADLs?: Yes Does the patient have difficulty dressing or bathing?: No Independently performs ADLs?: Yes (appropriate for developmental age) Does the patient have difficulty walking or climbing stairs?: No Weakness of Legs: None Weakness of Arms/Hands: None  Home Assistive Devices/Equipment Home Assistive Devices/Equipment: None    Abuse/Neglect Assessment (Assessment to be complete while patient is alone) Physical Abuse: Denies Verbal Abuse: Denies Sexual Abuse: Denies Exploitation of patient/patient's resources: Denies Self-Neglect: Denies Values / Beliefs Cultural Requests During Hospitalization: None Spiritual Requests During Hospitalization: None Consults Spiritual Care Consult Needed: No Social Work Consult Needed: No Merchant navy officer (For Healthcare) Does Patient Have a Medical Advance Directive?: No Would patient like information on creating a medical advance directive?: No - Patient declined    Additional Information 1:1 In Past 12 Months?: No CIRT Risk: No Elopement Risk: No Does patient have medical clearance?: Yes     Disposition: Per [facility] - patient meets criteria for inpatient hospitalization.  Disposition Initial Assessment Completed for this Encounter: Yes  Phillip Heal LaVerne 09/08/2016 2:06 PM

## 2016-09-08 NOTE — ED Triage Notes (Signed)
Patient reports she is feeling "suicidal and depressed. I was in the [facility] a couple of days ago" Pt reports no plan, she just feels like she wants to hurt herself. Tearful in triage. Calm and cooperative. Pt is here voluntary.

## 2016-09-08 NOTE — ED Notes (Signed)
Pt tearful upon arrival on unit. Pt endorses SI, depression. Pt states she "lost her daughter 3 months ago."  (Daughter did not pass away).  Pt told RN she does not see a psychiatrist or take any medications at home.  Pt calm and cooperative. Pt allowed to leave in nose ring (Pt stated it was too painful to take out and hasn't been removed for at least 3 years.)  Maintained on 15 minute checks and observation by security camera for safety.

## 2016-09-08 NOTE — ED Notes (Signed)
Pt given lunch tray. Pt is sleeping at this time.

## 2016-09-08 NOTE — BH Assessment (Signed)
[facility]

## 2016-09-08 NOTE — ED Notes (Signed)
Medications ordered by soc placed in computer.; per soc avoid benzos or other scheduled meds.

## 2016-09-08 NOTE — ED Notes (Signed)
Mother of patient called stating, "pt has drug and depression issues for a long time. And pt called her stating she wants to die and has no where to go. And that if we release her she is going to kill herself and that she was just calling her to tell her she loved her." No info was giving on pt. Due to mother not having pass code.

## 2016-09-08 NOTE — BH Assessment (Signed)
Attempted to complete consult; however, was informed pt is about to meet with the psychiatrist. Was asked to call back in 45 min.

## 2016-09-08 NOTE — ED Notes (Signed)
Pt moved to [facility] 

## 2016-09-08 NOTE — ED Notes (Signed)
Pt given sprite and sandwich tray per pt request.

## 2016-09-08 NOTE — ED Notes (Signed)
EPD made aware of headache and pain 8/10. Order received for 1 gram tylenol.

## 2016-09-08 NOTE — ED Notes (Signed)
Patient father called [employer] and states, "I just want to give yall some info. Tiffiany called me earlier today saying she has no reason to live and that no one loves her. I just figured yall needed to know."

## 2016-09-08 NOTE — ED Provider Notes (Signed)
[facility] Emergency Department Provider Note    First MD Initiated Contact with Patient 09/08/16 0930     (approximate)  I have reviewed the triage vital signs and the nursing notes.   HISTORY  Chief Complaint Suicidal    HPI Shelley Graham is a 21 y.o. female who was in the ER yesterday requesting detox with history of polysubstance abuse re presents today with suicidal ideation. Patient states she plans to walk out in front of traffic or drink a gallon of bleach. States that she's been feeling depressed for months. No history of suicide attempts. Is currently homeless. Denies any substance abuse today. States that recent stressors of substance abuse and being homeless and let her do feel this way.   Past Medical History:  Diagnosis Date  . Anxiety   . Depression   . History of migraine headaches   . Hx of chlamydia infection   . Hypertension   . Pregnancy induced hypertension   . UTI (lower urinary tract infection)    RESOLVED   Family History  Problem Relation Age of Onset  . Asthma Mother        as child  . Migraines Mother   . Thyroid disease Mother   . Cancer Father   . COPD Father   . Cancer Maternal Grandmother        colon  . Cancer Paternal Grandfather        lung  . Asthma Brother    Past Surgical History:  Procedure Laterality Date  . CHOLECYSTECTOMY N/A 12/20/2015   Procedure: LAPAROSCOPIC CHOLECYSTECTOMY;  Surgeon: Leafy Roiego F Pabon, MD;  Location: [facility] ORS;  Service: General;  Laterality: N/A;  . OTHER SURGICAL HISTORY     tubes ears 18 months  . PERINEAL LACERATION REPAIR N/A 11/03/2015   Procedure: SUTURE REPAIR PERINEAL LACERATION;  Surgeon: Hildred LaserAnika Cherry, MD;  Location: [facility] ORS;  Service: Gynecology;  Laterality: N/A;  . tubes in ears Bilateral    Patient Active Problem List   Diagnosis Date Noted  . Biliary colic 12/20/2015  . Acute posthemorrhagic anemia 11/07/2015  . Gestational hypertension 11/01/2015  . Obesity  (BMI 30-39.9) 09/27/2015  . Gallstones and inflammation of gallbladder without obstruction 05/25/2015  . Marijuana use, episodic 05/25/2015  . GBS bacteriuria 03/14/2015  . Maternal varicella, non-immune 03/10/2015  . Rubella non-immune status, antepartum 03/10/2015  . History of migraine headaches   . Anxiety   . Depression       Prior to Admission medications   Medication Sig Start Date End Date Taking? Authorizing Provider  docusate sodium (COLACE) 100 MG capsule Take 2 capsules (200 mg total) by mouth 2 (two) times daily. 03/02/16   Sharman CheekStafford, Phillip, MD  HYDROcodone-acetaminophen (NORCO/VICODIN) 5-325 MG tablet Take 1 tablet by mouth every 4 (four) hours as needed for moderate pain. Patient not taking: Reported on 12/20/2015 12/13/15   Lattie Hawooper, Richard E, MD  ibuprofen (ADVIL,MOTRIN) 800 MG tablet Take 1 tablet (800 mg total) by mouth every 8 (eight) hours as needed. 07/05/16   Charlynne PanderYao, David Hsienta, MD  naproxen (NAPROSYN) 500 MG tablet Take 1 tablet (500 mg total) by mouth 2 (two) times daily as needed. 07/20/16 07/20/17  Nita SickleVeronese, [place], MD  nitrofurantoin (MACRODANTIN) 100 MG capsule Take 1 capsule (100 mg total) by mouth 2 (two) times daily. 03/02/16   Sharman CheekStafford, Phillip, MD  oxyCODONE-acetaminophen (PERCOCET) 7.5-325 MG tablet Take 2 tablets by mouth every 4 (four) hours as needed for moderate pain. 12/21/15  Pabon, Diego F, MD  polyethylene glycol (MIRALAX / GLYCOLAX) packet Take 17 g by mouth daily. 07/05/16   Charlynne Pander, MD  senna (SENOKOT) 8.6 MG TABS tablet Take 2 tablets (17.2 mg total) by mouth 2 (two) times daily. 03/02/16   Sharman Cheek, MD    Allergies Ceftriaxone sodium in dextrose and Ciprofloxacin    Social History Social History  Substance Use Topics  . Smoking status: Current Every Day Smoker    Packs/day: 1.00    Years: 6.00    Types: Cigarettes    Last attempt to quit: 03/15/2015  . Smokeless tobacco: Never Used  . Alcohol use 0.0 oz/week     Review of Systems Patient denies headaches, rhinorrhea, blurry vision, numbness, shortness of breath, chest pain, edema, cough, abdominal pain, nausea, vomiting, diarrhea, dysuria, fevers, rashes or hallucinations unless otherwise stated above in HPI. ____________________________________________   PHYSICAL EXAM:  VITAL SIGNS: Vitals:   09/08/16 0908  BP: (!) 143/94  Pulse: 77  Resp: 16  Temp: 97.4 F (36.3 C)    Constitutional: Alert and oriented. Well appearing and in no acute distress. Eyes: Conjunctivae are normal. PERRL. EOMI. Head: Atraumatic. Nose: No congestion/rhinnorhea. Mouth/Throat: Mucous membranes are moist.  Oropharynx non-erythematous. Neck: No stridor. Painless ROM. No cervical spine tenderness to palpation Hematological/Lymphatic/Immunilogical: No cervical lymphadenopathy. Cardiovascular: Normal rate, regular rhythm. Grossly normal heart sounds.  Good peripheral circulation. Respiratory: Normal respiratory effort.  No retractions. Lungs CTAB. Gastrointestinal: Soft and nontender. No distention. No abdominal bruits. No CVA tenderness.  Musculoskeletal: No lower extremity tenderness nor edema.  No joint effusions. Neurologic:  Normal speech and language. No gross focal neurologic deficits are appreciated. No gait instability. Skin:  Skin is warm, dry and intact. No rash noted. Psychiatric: Mood is depressed. affect normal. Speech and behavior are normal.  ____________________________________________   LABS (all labs ordered are listed, but only abnormal results are displayed)  Results for orders placed or performed during the hospital encounter of 09/08/16 (from the past 24 hour(s))  Comprehensive metabolic panel     Status: None   Collection Time: 09/08/16  9:11 AM  Result Value Ref Range   Sodium 140 135 - 145 mmol/L   Potassium 3.8 3.5 - 5.1 mmol/L   Chloride 108 101 - 111 mmol/L   CO2 25 22 - 32 mmol/L   Glucose, Bld 88 65 - 99 mg/dL   BUN 18 6  - 20 mg/dL   Creatinine, Ser 0.98 0.44 - 1.00 mg/dL   Calcium 9.2 8.9 - 11.9 mg/dL   Total Protein 7.3 6.5 - 8.1 g/dL   Albumin 4.1 3.5 - 5.0 g/dL   AST 18 15 - 41 U/L   ALT 17 14 - 54 U/L   Alkaline Phosphatase 100 38 - 126 U/L   Total Bilirubin 1.1 0.3 - 1.2 mg/dL   GFR calc non Af Amer >60 >60 mL/min   GFR calc Af Amer >60 >60 mL/min   Anion gap 7 5 - 15  Ethanol     Status: None   Collection Time: 09/08/16  9:11 AM  Result Value Ref Range   Alcohol, Ethyl (B) <5 <5 mg/dL  Salicylate level     Status: None   Collection Time: 09/08/16  9:11 AM  Result Value Ref Range   Salicylate Lvl <7.0 2.8 - 30.0 mg/dL  Acetaminophen level     Status: Abnormal   Collection Time: 09/08/16  9:11 AM  Result Value Ref Range   Acetaminophen (Tylenol), Serum <  10 (L) 10 - 30 ug/mL  cbc     Status: Abnormal   Collection Time: 09/08/16  9:11 AM  Result Value Ref Range   WBC 14.1 (H) 3.6 - 11.0 K/uL   RBC 4.71 3.80 - 5.20 MIL/uL   Hemoglobin 13.0 12.0 - 16.0 g/dL   HCT 16.1 09.6 - 04.5 %   MCV 83.0 80.0 - 100.0 fL   MCH 27.6 26.0 - 34.0 pg   MCHC 33.2 32.0 - 36.0 g/dL   RDW 40.9 (H) 81.1 - 91.4 %   Platelets 271 150 - 440 K/uL  Pregnancy, urine POC     Status: None   Collection Time: 09/08/16  9:55 AM  Result Value Ref Range   Preg Test, Ur NEGATIVE NEGATIVE   ____________________________________________ ____________________________________________  RADIOLOGY   ____________________________________________   PROCEDURES  Procedure(s) performed:  Procedures    Critical Care performed: no ____________________________________________   INITIAL IMPRESSION / ASSESSMENT AND PLAN / ED COURSE  Pertinent labs & imaging results that were available during my care of the patient were reviewed by me and considered in my medical decision making (see chart for details).  DDX: Psychosis, delirium, medication effect, noncompliance, polysubstance abuse, Si, Hi, depression   Kili J Coggin  is a 21 y.o. who presents to the ED with for evaluation of SI.  Patient has psych history of depression and substance abuse.  Laboratory testing was ordered to evaluation for underlying electrolyte derangement or signs of underlying organic pathology to explain today's presentation.  Based on history and physical and laboratory evaluation, it appears that the patient's presentation is 2/2 underlying psychiatric disorder and will require further evaluation and management by inpatient psychiatry.  Patient was made an IVC due to SI and substance abuse.  Disposition pending psychiatric evaluation.       ____________________________________________   FINAL CLINICAL IMPRESSION(S) / ED DIAGNOSES  Final diagnoses:  Suicidal ideation  Depression, unspecified depression type      NEW MEDICATIONS STARTED DURING THIS VISIT:  New Prescriptions   No medications on file     Note:  This document was prepared using Dragon voice recognition software and may include unintentional dictation errors.    Willy Eddy, MD 09/08/16 1022

## 2016-09-08 NOTE — ED Notes (Signed)
Pt. Alert and oriented, warm and dry, in no distress. Pt. Denies SI, HI, and AVH. Pt complaining of headache 8/10. Pt. Encouraged to let nursing staff know of any concerns or needs.

## 2016-09-08 NOTE — ED Notes (Signed)
Pt lying in bed waiting on SOC.

## 2016-09-08 NOTE — ED Notes (Signed)
Pt placed in room 23 by tech, Dianna. Pt belongings bagged, labeled and placed next to quad RN. Pt given remote and water and pt informed urine is needed. TV remote returned to this tech.

## 2016-09-08 NOTE — BH Assessment (Signed)
Called back to complete the consult and was informed another pt had a stroke and [facility]. Therefore, the nurse, stated she will call back when the pt is ready. Pt still needs to meet with the psychiatrist prior to meeting with TTS EW 11:53am.

## 2016-09-09 DIAGNOSIS — F332 Major depressive disorder, recurrent severe without psychotic features: Secondary | ICD-10-CM

## 2016-09-09 MED ORDER — SERTRALINE HCL 50 MG PO TABS: 50.0000 mg | ORAL_TABLET | Freq: Every day | ORAL | Status: DC

## 2016-09-09 MED ORDER — TRAZODONE HCL 100 MG PO TABS: 100.0000 mg | ORAL_TABLET | Freq: Every evening | ORAL | Status: DC | PRN

## 2016-09-09 NOTE — ED Provider Notes (Signed)
-----------------------------------------   6:53 AM on 09/09/2016 -----------------------------------------   Blood pressure (!) 126/103, pulse 81, temperature 98.1 F (36.7 C), temperature source Oral, resp. rate 16, height 5\' 8"  (1.727 m), weight 160 lb (72.6 kg), last menstrual period 09/07/2016, SpO2 100 %, not currently breastfeeding.  The patient had no acute events since last update.  Calm and cooperative at this time.  The patient is to be seen by psych.     Rebecka ApleyWebster, Masoud Nyce P, MD 09/09/16 [phone]

## 2016-09-09 NOTE — Consult Note (Signed)
[facility] Consult   Reason for Consult:  Consult for 21 year old woman with a history of mood and behavior problems and comes to the emergency room saying she is suicidal Referring Physician:  McShane Patient Identification: Shelley Graham MRN:  [id number] Principal Diagnosis: Severe recurrent major depression without psychotic features [facility]) Diagnosis:   Patient Active Problem List   Diagnosis Date Noted  . Severe recurrent major depression without psychotic features ([place]) [F33.2] 09/09/2016  . Biliary colic [F57.32] 20/25/4270  . Acute posthemorrhagic anemia [D62] 11/07/2015  . Gestational hypertension [O13.9] 11/01/2015  . Obesity (BMI 30-39.9) [E66.9] 09/27/2015  . Gallstones and inflammation of gallbladder without obstruction [K80.00] 05/25/2015  . 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 GBS bacteriuria [R82.71] 03/14/2015  . Maternal varicella, non-immune [O09.899, Z28.3] 03/10/2015  . Rubella non-immune status, antepartum [O99.89, Z28.3] 03/10/2015  . History of migraine headaches [Z86.69]   . Anxiety [F41.9]   . Depression [F32.9]     Total Time spent with patient: 45 minutes  Subjective:   Shelley Graham is a 21 y.o. female patient admitted with "I've had a lot of suicidal ideation recently".  HPI:  Patient claims her mood is been terrible for the last month. She says she has lost everything in her life. Her daughter's custody was taken from her, patient lost her living place in her job. Says that she's been on the streets for the last few days. Has suicidal thoughts regularly with vague plans to jump in front of cars or cut herself. She feels hopeless and negative all the time. Doesn't sleep very well. Not currently taking any psychiatric medicine or receiving any outpatient psychiatric treatment. Patient claims to be using alcohol and cocaine heavily on a daily basis. This is actually contradicted by her drug screen which does not show cocaine or  alcohol but does show marijuana. She says she has vague auditory hallucinations from time to time.  Medical history: Overweight. History of gallstones and high blood pressure.  Substance abuse history: Patient claims that she is using cocaine regularly but there is none and her drug screen only marijuana. Claims to be using alcohol heavily although there is no sign of intoxication when she presented.  Social history: Currently says she is living on the street having just gotten thrown out of the last place she was living. Not able to work right now because of her mood and behavior. At least one child custody has been taken away  Past Psychiatric History: Patient has a history of psychiatric treatment with medicine from her OB in the past. Denies any past suicide attempts. Denies prior psychiatric hospitalization. At presented to the emergency room not long ago just in this month and at that time had substance abuse but was not reporting the intensity of mood symptoms she claims to have now. She thinks that her OB/GYN prescribed Zoloft for her in the past  Risk to Self: Suicidal Ideation: Yes-Currently Present Suicidal Intent: Yes-Currently Present Is patient at risk for suicide?: Yes Suicidal Plan?: No Access to Means: No What has been your use of drugs/alcohol within the last 12 months?: None Reported How many times?: 0 Other Self Harm Risks: NA Triggers for Past Attempts:  (Lost custody of her daughter temp; Unable to stop using drug) Intentional Self Injurious Behavior: None Risk to Others: Homicidal Ideation: No Thoughts of Harm to Others: No Current Homicidal Intent: No Current Homicidal Plan: No Access to Homicidal Means: No Identified Victim: NA History of harm to others?:  No Assessment of Violence: None Noted Violent Behavior Description: None Reported Does patient have access to weapons?: No Criminal Charges Pending?: Yes Does patient have a court date: Yes Court Date:  09/27/16 Prior Inpatient Therapy: Prior Inpatient Therapy: No Prior Therapy Dates:  (NA) Prior Therapy Facilty/Provider(s): NA Reason for Treatment: NA Prior Outpatient Therapy: Prior Outpatient Therapy: No Prior Therapy Dates: NA Prior Therapy Facilty/Provider(s): NA Reason for Treatment: NA Does patient have an ACCT team?: No Does patient have Intensive In-House Services?  : No Does patient have [facility] services? : No Does patient have P4CC services?: No  Past Medical History:  Past Medical History:  Diagnosis Date  . Anxiety   . Depression   . History of migraine headaches   . Hx of chlamydia infection   . Hypertension   . Pregnancy induced hypertension   . UTI (lower urinary tract infection)    RESOLVED    Past Surgical History:  Procedure Laterality Date  . CHOLECYSTECTOMY N/A 12/20/2015   Procedure: LAPAROSCOPIC CHOLECYSTECTOMY;  Surgeon: Jules Husbands, MD;  Location: [facility] ORS;  Service: General;  Laterality: N/A;  . OTHER SURGICAL HISTORY     tubes ears 18 months  . PERINEAL LACERATION REPAIR N/A 11/03/2015   Procedure: SUTURE REPAIR PERINEAL LACERATION;  Surgeon: Rubie Maid, MD;  Location: [facility] ORS;  Service: Gynecology;  Laterality: N/A;  . tubes in ears Bilateral    Family History:  Family History  Problem Relation Age of Onset  . Asthma Mother        as child  . Migraines Mother   . Thyroid disease Mother   . Cancer Father   . COPD Father   . Cancer Maternal Grandmother        colon  . Cancer Paternal Grandfather        lung  . Asthma Brother    Family Psychiatric  History: Says that her father's side of the family had extensive alcohol use and anxiety disorder Social History:  History  Alcohol Use  . 0.0 oz/week     History  Drug Use  . Types: Benzodiazepines, Marijuana, Cocaine    Comment: uses with anxiety for 6 years    Social History   Social History  . Marital status: Single    Spouse name: N/A  . Number of children: N/A  . Years of  education: N/A   Social History Main Topics  . Smoking status: Current Every Day Smoker    Packs/day: 1.00    Years: 6.00    Types: Cigarettes    Last attempt to quit: 03/15/2015  . Smokeless tobacco: Never Used  . Alcohol use 0.0 oz/week  . Drug use: Yes    Types: Benzodiazepines, Marijuana, Cocaine     Comment: uses with anxiety for 6 years  . Sexual activity: No   Other Topics Concern  . None   Social History Narrative  . None   Additional Social History:    Allergies:   Allergies  Allergen Reactions  . Ceftriaxone Sodium In Dextrose Rash    Reaction occurred immediately after starting IV Ceftriaxone and medication was stopped directly after symptoms began.   . Ciprofloxacin Hives and Rash    Labs:  Results for orders placed or performed during the hospital encounter of 09/08/16 (from the past 48 hour(s))  Comprehensive metabolic panel     Status: None   Collection Time: 09/08/16  9:11 AM  Result Value Ref Range   Sodium 140 135 - 145 mmol/L  Potassium 3.8 3.5 - 5.1 mmol/L   Chloride 108 101 - 111 mmol/L   CO2 25 22 - 32 mmol/L   Glucose, Bld 88 65 - 99 mg/dL   BUN 18 6 - 20 mg/dL   Creatinine, Ser 0.78 0.44 - 1.00 mg/dL   Calcium 9.2 8.9 - 10.3 mg/dL   Total Protein 7.3 6.5 - 8.1 g/dL   Albumin 4.1 3.5 - 5.0 g/dL   AST 18 15 - 41 U/L   ALT 17 14 - 54 U/L   Alkaline Phosphatase 100 38 - 126 U/L   Total Bilirubin 1.1 0.3 - 1.2 mg/dL   GFR calc non Af Amer >60 >60 mL/min   GFR calc Af Amer >60 >60 mL/min    Comment: (NOTE) The eGFR has been calculated using the CKD EPI equation. This calculation has not been validated in all clinical situations. eGFR's persistently <60 mL/min signify possible Chronic Kidney Disease.    Anion gap 7 5 - 15  Ethanol     Status: None   Collection Time: 09/08/16  9:11 AM  Result Value Ref Range   Alcohol, Ethyl (B) <5 <5 mg/dL    Comment:        LOWEST DETECTABLE LIMIT FOR SERUM ALCOHOL IS 5 mg/dL FOR MEDICAL PURPOSES  ONLY   Salicylate level     Status: None   Collection Time: 09/08/16  9:11 AM  Result Value Ref Range   Salicylate Lvl <0.9 2.8 - 30.0 mg/dL  Acetaminophen level     Status: Abnormal   Collection Time: 09/08/16  9:11 AM  Result Value Ref Range   Acetaminophen (Tylenol), Serum <10 (L) 10 - 30 ug/mL    Comment:        THERAPEUTIC CONCENTRATIONS VARY SIGNIFICANTLY. A RANGE OF 10-30 ug/mL MAY BE AN EFFECTIVE CONCENTRATION FOR MANY PATIENTS. HOWEVER, SOME ARE BEST TREATED AT CONCENTRATIONS OUTSIDE THIS RANGE. ACETAMINOPHEN CONCENTRATIONS >150 ug/mL AT 4 HOURS AFTER INGESTION AND >50 ug/mL AT 12 HOURS AFTER INGESTION ARE OFTEN ASSOCIATED WITH TOXIC REACTIONS.   cbc     Status: Abnormal   Collection Time: 09/08/16  9:11 AM  Result Value Ref Range   WBC 14.1 (H) 3.6 - 11.0 K/uL   RBC 4.71 3.80 - 5.20 MIL/uL   Hemoglobin 13.0 12.0 - 16.0 g/dL   HCT 39.1 35.0 - 47.0 %   MCV 83.0 80.0 - 100.0 fL   MCH 27.6 26.0 - 34.0 pg   MCHC 33.2 32.0 - 36.0 g/dL   RDW 16.8 (H) 11.5 - 14.5 %   Platelets 271 150 - 440 K/uL  Urine Drug Screen, Qualitative     Status: Abnormal   Collection Time: 09/08/16  9:11 AM  Result Value Ref Range   Tricyclic, Ur Screen NONE DETECTED NONE DETECTED   Amphetamines, Ur Screen NONE DETECTED NONE DETECTED   MDMA (Ecstasy)Ur Screen NONE DETECTED NONE DETECTED   Cocaine Metabolite,Ur [place] NONE DETECTED NONE DETECTED   Opiate, Ur Screen NONE DETECTED NONE DETECTED   Phencyclidine (PCP) Ur S NONE DETECTED NONE DETECTED   Cannabinoid 50 Ng, Ur  POSITIVE (A) NONE DETECTED   Barbiturates, Ur Screen NONE DETECTED NONE DETECTED   Benzodiazepine, Ur Scrn NONE DETECTED NONE DETECTED   Methadone Scn, Ur NONE DETECTED NONE DETECTED    Comment: (NOTE) 983  Tricyclics, urine               Cutoff 1000 ng/mL 200  Amphetamines, urine  Cutoff 1000 ng/mL 300  MDMA (Ecstasy), urine           Cutoff 500 ng/mL 400  Cocaine Metabolite, urine       Cutoff 300 ng/mL 500   Opiate, urine                   Cutoff 300 ng/mL 600  Phencyclidine (PCP), urine      Cutoff 25 ng/mL 700  Cannabinoid, urine              Cutoff 50 ng/mL 800  Barbiturates, urine             Cutoff 200 ng/mL 900  Benzodiazepine, urine           Cutoff 200 ng/mL 1000 Methadone, urine                Cutoff 300 ng/mL 1100 1200 The urine drug screen provides only a preliminary, unconfirmed 1300 analytical test result and should not be used for non-medical 1400 purposes. Clinical consideration and professional judgment should 1500 be applied to any positive drug screen result due to possible 1600 interfering substances. A more specific alternate chemical method 1700 must be used in order to obtain a confirmed analytical result.  1800 Gas chromato graphy / mass spectrometry (GC/MS) is the preferred 1900 confirmatory method.   Urinalysis, Complete w Microscopic     Status: Abnormal   Collection Time: 09/08/16  9:11 AM  Result Value Ref Range   Color, Urine STRAW (A) YELLOW   APPearance CLEAR (A) CLEAR   Specific Gravity, Urine 1.003 (L) 1.005 - 1.030   pH 5.0 5.0 - 8.0   Glucose, UA NEGATIVE NEGATIVE mg/dL   Hgb urine dipstick NEGATIVE NEGATIVE   Bilirubin Urine NEGATIVE NEGATIVE   Ketones, ur NEGATIVE NEGATIVE mg/dL   Protein, ur NEGATIVE NEGATIVE mg/dL   Nitrite NEGATIVE NEGATIVE   Leukocytes, UA NEGATIVE NEGATIVE   RBC / HPF 0-5 0 - 5 RBC/hpf   WBC, UA 0-5 0 - 5 WBC/hpf   Bacteria, UA RARE (A) NONE SEEN   Squamous Epithelial / LPF 0-5 (A) NONE SEEN  Pregnancy, urine POC     Status: None   Collection Time: 09/08/16  9:55 AM  Result Value Ref Range   Preg Test, Ur NEGATIVE NEGATIVE    Comment:        THE SENSITIVITY OF THIS METHODOLOGY IS >24 mIU/mL     Current Facility-Administered Medications  Medication Dose Route Frequency Provider Last Rate Last Dose  . hydrOXYzine (ATARAX/VISTARIL) tablet 50 mg  50 mg Oral QHS PRN Merlyn Lot, MD      . Derrill Memo ON 09/10/2016]  sertraline (ZOLOFT) tablet 50 mg  50 mg Oral Daily Clapacs, John T, MD      . traZODone (DESYREL) tablet 100 mg  100 mg Oral QHS PRN Clapacs, Madie Reno, MD       No current outpatient prescriptions on file.    Musculoskeletal: Strength & Muscle Tone: within normal limits Gait & Station: normal Patient leans: N/A  Psychiatric Specialty Exam: Physical Exam  Nursing note and vitals reviewed. Constitutional: She appears well-developed and well-nourished.  HENT:  Head: Normocephalic and atraumatic.  Eyes: Conjunctivae are normal. Pupils are equal, round, and reactive to light.  Neck: Normal range of motion.  Cardiovascular: Regular rhythm and normal heart sounds.   Respiratory: Effort normal. No respiratory distress.  GI: Soft.  Musculoskeletal: Normal range of motion.  Neurological: She is alert.  Skin: Skin  is warm and dry.  Psychiatric: Her mood appears anxious. Her speech is delayed. She is slowed. Thought content is paranoid. Cognition and memory are normal. She expresses impulsivity. She expresses suicidal ideation. She expresses no homicidal ideation.    Review of Systems  Constitutional: Negative.   HENT: Negative.   Eyes: Negative.   Respiratory: Negative.   Cardiovascular: Negative.   Gastrointestinal: Negative.   Musculoskeletal: Negative.   Skin: Negative.   Neurological: Negative.   Psychiatric/Behavioral: Positive for depression, hallucinations, substance abuse and suicidal ideas. Negative for memory loss. The patient is nervous/anxious and has insomnia.     Blood pressure (!) 126/103, pulse 81, temperature 98.1 F (36.7 C), temperature source Oral, resp. rate 16, height '5\' 8"'  (1.727 m), weight 72.6 kg (160 lb), last menstrual period 09/07/2016, SpO2 100 %, not currently breastfeeding.Body mass index is 24.33 kg/m.  General Appearance: Casual  Eye Contact:  Fair  Speech:  Clear and Coherent  Volume:  Decreased  Mood:  Depressed  Affect:  Constricted and Depressed   Thought Process:  Goal Directed  Orientation:  Full (Time, Place, and Person)  Thought Content:  Logical  Suicidal Thoughts:  Yes.  with intent/plan  Homicidal Thoughts:  No  Memory:  Immediate;   Fair Recent;   Fair Remote;   Fair  Judgement:  Impaired  Insight:  Shallow  Psychomotor Activity:  Decreased  Concentration:  Concentration: Fair  Recall:  [employer] of Knowledge:  Fair  Language:  Fair  Akathisia:  No  Handed:  Right  AIMS (if indicated):     Assets:  Desire for Improvement Physical Health Resilience  ADL's:  Intact  Cognition:  Impaired,  Mild  Sleep:        Treatment Plan Summary: Daily contact with patient to assess and evaluate symptoms and progress in treatment, Medication management and Plan 21 year old woman who is reporting active suicidal thoughts. Claiming to be using more drugs then we have evidence of. I'm a little bit suspicious about her possibly being secondary gain involved in this. She does appear to be depressed however. Insisting that she is suicidal. Orders will be completed to admit her to the inpatient unit reusing the Zoloft previously used full set of labs to be obtained 15 minute checks in place. Supportive counseling and daily follow-up with the patient while she is here.  Disposition: Recommend psychiatric Inpatient admission when medically cleared. Supportive therapy provided about ongoing stressors.  Alethia Berthold, MD 09/09/2016 5:20 PM

## 2016-09-09 NOTE — ED Notes (Signed)
PT IVC/ PENDING PLACEMENT  

## 2016-09-09 NOTE — ED Notes (Signed)

## 2016-09-09 NOTE — ED Notes (Signed)
Pt. Alert and oriented, warm and dry, in no distress. Pt. Denies HI, and AVH. Pt states having passive SI without a plan. Patient able to contract for safety. Pt states a lot of stressors going on in her life. The possibility of going to jail because of probation violation and losing custody of her 5110 month old daughter. Pt states she uses cocaine on a daily basis. Pt states she can get off cocaine but always goes back to it. This Clinical research associatewriter gave words of encouragement. Pt. Encouraged to let nursing staff know of any concerns or needs.

## 2016-09-09 NOTE — BH Assessment (Signed)
[facility] Dr. Toni Amendlapacs.  Attending Physician will be Dr. Ardyth HarpsHernandez.   Patient has [facility], [facility] [place].   ER staff is aware of the admission (Dr. Alphonzo LemmingsMcShane, ER MD & [place], Patient Access).

## 2016-09-09 NOTE — ED Notes (Signed)
Patient is alert and oriented x 4.  Presents with flat affect and depressed mood.  Reports suicidal thoughts but contract for safety.  Medication given as prescribed.  Routine safety checks maintained.  Support and encouragement offered as needed.  Patient safe on the unit.

## 2016-09-10 ENCOUNTER — Inpatient Hospital Stay
Admission: RE | Admit: 2016-09-10 | Discharge: 2016-09-13 | DRG: 885 | Disposition: A | Discharge status: Home / Self Care | Payer: BLUE CROSS/BLUE SHIELD | Source: Intra-hospital | Attending: Psychiatry | Admitting: Psychiatry

## 2016-09-10 DIAGNOSIS — I10 Essential (primary) hypertension: Secondary | ICD-10-CM | POA: Diagnosis present

## 2016-09-10 DIAGNOSIS — Z91411 Personal history of adult psychological abuse: Secondary | ICD-10-CM | POA: Diagnosis not present

## 2016-09-10 DIAGNOSIS — F1020 Alcohol dependence, uncomplicated: Secondary | ICD-10-CM

## 2016-09-10 DIAGNOSIS — F17210 Nicotine dependence, cigarettes, uncomplicated: Secondary | ICD-10-CM | POA: Diagnosis present

## 2016-09-10 DIAGNOSIS — F1220 Cannabis dependence, uncomplicated: Secondary | ICD-10-CM | POA: Diagnosis present

## 2016-09-10 DIAGNOSIS — Z881 Allergy status to other antibiotic agents status: Secondary | ICD-10-CM | POA: Diagnosis not present

## 2016-09-10 DIAGNOSIS — F1420 Cocaine dependence, uncomplicated: Secondary | ICD-10-CM

## 2016-09-10 DIAGNOSIS — Z590 Homelessness: Secondary | ICD-10-CM | POA: Diagnosis not present

## 2016-09-10 DIAGNOSIS — F332 Major depressive disorder, recurrent severe without psychotic features: Secondary | ICD-10-CM | POA: Diagnosis present

## 2016-09-10 DIAGNOSIS — F17200 Nicotine dependence, unspecified, uncomplicated: Secondary | ICD-10-CM

## 2016-09-10 DIAGNOSIS — Z888 Allergy status to other drugs, medicaments and biological substances status: Secondary | ICD-10-CM | POA: Diagnosis not present

## 2016-09-10 DIAGNOSIS — F419 Anxiety disorder, unspecified: Secondary | ICD-10-CM | POA: Diagnosis present

## 2016-09-10 DIAGNOSIS — Z79899 Other long term (current) drug therapy: Secondary | ICD-10-CM

## 2016-09-10 DIAGNOSIS — Z91410 Personal history of adult physical and sexual abuse: Secondary | ICD-10-CM | POA: Diagnosis not present

## 2016-09-10 DIAGNOSIS — G4700 Insomnia, unspecified: Secondary | ICD-10-CM | POA: Diagnosis present

## 2016-09-10 LAB — LIPID PANEL
CHOL/HDL RATIO: 5 ratio
Cholesterol: 186 mg/dL (ref 0–200)
HDL: 37 mg/dL — AB (ref >40)
LDL CALC: 120 mg/dL — AB (ref 0–99)
Triglycerides: 146 mg/dL (ref <150)
VLDL: 29 mg/dL (ref 0–40)

## 2016-09-10 LAB — TSH: TSH: 3.047 u[IU]/mL (ref 0.350–4.500)

## 2016-09-10 MED ORDER — NICOTINE 21 MG/24HR TD PT24
21.0000 mg | MEDICATED_PATCH | Freq: Every day | TRANSDERMAL | Status: DC
  Administered 2016-09-11 – 2016-09-12 (×2): 21 mg via TRANSDERMAL
  Filled 2016-09-10 (×4): qty 1

## 2016-09-10 MED ORDER — MAGNESIUM HYDROXIDE 400 MG/5ML PO SUSP: 30.0000 mL | Freq: Every day | ORAL | Status: DC | PRN

## 2016-09-10 MED ORDER — ACETAMINOPHEN 325 MG PO TABS
650.0000 mg | ORAL_TABLET | Freq: Four times a day (QID) | ORAL | Status: DC | PRN
  Administered 2016-09-10 – 2016-09-12 (×3): 650 mg via ORAL
  Filled 2016-09-10 (×2): qty 2

## 2016-09-10 MED ORDER — FLUOXETINE HCL 10 MG PO CAPS
10.0000 mg | ORAL_CAPSULE | Freq: Every day | ORAL | Status: DC
  Administered 2016-09-11 – 2016-09-13 (×3): 10 mg via ORAL
  Filled 2016-09-10 (×3): qty 1

## 2016-09-10 MED ORDER — SERTRALINE HCL 25 MG PO TABS
50.0000 mg | ORAL_TABLET | Freq: Every day | ORAL | Status: DC
  Administered 2016-09-10: 50 mg via ORAL
  Filled 2016-09-10: qty 2

## 2016-09-10 MED ORDER — ALUM & MAG HYDROXIDE-SIMETH 200-200-20 MG/5ML PO SUSP: 30.0000 mL | ORAL | Status: DC | PRN

## 2016-09-10 MED ORDER — HYDROXYZINE HCL 25 MG PO TABS
25.0000 mg | ORAL_TABLET | Freq: Three times a day (TID) | ORAL | Status: DC | PRN
  Administered 2016-09-11: 25 mg via ORAL
  Filled 2016-09-10: qty 1

## 2016-09-10 MED ORDER — TRAZODONE HCL 100 MG PO TABS
100.0000 mg | ORAL_TABLET | Freq: Every day | ORAL | Status: DC
  Administered 2016-09-10: 100 mg via ORAL
  Filled 2016-09-10: qty 1

## 2016-09-10 NOTE — Progress Notes (Signed)
Pt pleasant and cooperative with care. Isolates to self and room. Guarded, forwards little. Denies SI, HI, AVH. Medication compliant. Does not attend groups. No interaction with peers. Encouragement and support offered. Safety checks maintained. Pt receptive and remains safe on unit with q 15 min checks.

## 2016-09-10 NOTE — Progress Notes (Signed)
Recreation Therapy Notes  Date: 05.15.18 Time: 3:00 pm Location: Craft Room  Group Topic: Self-expression  Goal Area(s) Addresses:  Patient will be able to identify a color that represents each emotion. Patient will verbalize benefit of using art as a means of self-expression. Patient will verbalize one emotion experienced while participating in activity.  Behavioral Response: Attentive, Left early  Intervention: The Colors Within Me  Activity: Patients were given blank face worksheets and were instructed pick a color for each emotion they were feeling and show on the worksheet how much of that emotion they were feeling.  Education: LRT educated patients on other forms of self-expression.  Education Outcome: Patient left before LRT educated group.   Clinical Observations/Feedback: Patient left group at approximately 3:08 pm with Dr. Ardyth HarpsHernandez and did not return to group.  Jacquelynn CreeGreene,Zeynep Fantroy M, LRT/CTRS 09/10/2016 3:50 PM

## 2016-09-10 NOTE — Tx Team (Addendum)
Initial Treatment Plan 09/10/2016 5:10 AM Devanny Sharyon CableJ Ebbs [id number]    PATIENT STRESSORS: Financial difficulties Legal issue Marital or family conflict Occupational concerns Substance abuse   PATIENT STRENGTHS: Capable of independent living Manufacturing systems engineerCommunication skills Physical Health Work skills   PATIENT IDENTIFIED PROBLEMS: "Need help with my depression"  "I want my child back"  Depression  Anxiety  Substance abuse  Risk for suicide  Psychosis         DISCHARGE CRITERIA:  Ability to meet basic life and health needs Adequate post-discharge living arrangements Verbal commitment to aftercare and medication compliance Withdrawal symptoms are absent or subacute and managed without 24-hour nursing intervention  PRELIMINARY DISCHARGE PLAN: Return to previous living arrangement  PATIENT/FAMILY INVOLVEMENT: This treatment plan has been presented to and reviewed with the patient, Leslie Dalesiffany J Coleman, and/or family member.  The patient and family have been given the opportunity to ask questions and make suggestions.  Eveline KetoAdediran T Eduardo Wurth, RN 09/10/2016, 5:10 AM

## 2016-09-10 NOTE — Progress Notes (Addendum)
Admission Note  Pt is a 21 y/o female [facility]. On arrival, Pt was alert and oriented x4. Pt at the time of admission endorsed moderate anxiety and depression; states, "I child has been taking away from me, I have lost my job, I just not happy." Pt at the time denied pain, SI, HI or AVH; states, "I hopeful and I'm ready for a change." Pt contracts for safety. Pt admitted to using THC, cocaine and alcohol. Pt goal are "Need help with my depression" and "I want my child back." Support, encouragement, and safe environment provided.  15-minute safety checks initiated and continued. Pt remained calm and cooperative through the admission process. Pt searched and no contrabands found.
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
## 2016-09-10 NOTE — ED Notes (Signed)
[facility] Patient is aware of transfer to [facility].

## 2016-09-10 NOTE — Plan of Care (Signed)
Problem: Activity: Goal: Interest or engagement in leisure activities will improve Outcome: Not Progressing Isolative to self and room

## 2016-09-10 NOTE — [facility] Group Notes (Signed)
[facility] LCSW Group Therapy Note  Date/Time  Type of Therapy/Topic:  Group Therapy:  Feelings about Diagnosis  Participation Level:  Did Not Attend   Mood:   Description of Group:    This group will allow patients to explore their thoughts and feelings about diagnoses they have received. Patients will be guided to explore their level of understanding and acceptance of these diagnoses. Facilitator will encourage patients to process their thoughts and feelings about the reactions of others to their diagnosis, and will guide patients in identifying ways to discuss their diagnosis with significant others in their lives. This group will be process-oriented, with patients participating in exploration of their own experiences as well as giving and receiving support and challenge from other group members.   Therapeutic Goals: 1. Patient will demonstrate understanding of diagnosis as evidence by identifying two or more symptoms of the disorder:  2. Patient will be able to express two feelings regarding the diagnosis 3. Patient will demonstrate ability to communicate their needs through discussion and/or role plays  Summary of Patient Progress:        Therapeutic Modalities:   Cognitive Behavioral Therapy Brief Therapy Feelings Identification   Daleen SquibbGreg Camesha Farooq, LCSW

## 2016-09-10 NOTE — [facility] Suicide Risk Assessment (Signed)
[facility]   Nursing information obtained from:  Patient Demographic factors:  Adolescent or young adult, Low socioeconomic status, Living alone, Unemployed Current Mental Status:  Suicidal ideation indicated by others Loss Factors:  Legal issues, Financial problems / change in socioeconomic status Historical Factors:  NA Risk Reduction Factors:  Responsible for children under 22 years of age  Total Time spent with patient:  Principal Problem: Severe recurrent major depression without psychotic features (HCC) Diagnosis:   Patient Active Problem List   Diagnosis Date Noted  . Severe recurrent major depression without psychotic features (HCC) [F33.2] 09/09/2016  . Obesity (BMI 30-39.9) [E66.9] 09/27/2015  . Marijuana use, episodic [F12.90] 05/25/2015   Subjective Data:   Continued Clinical Symptoms:  Alcohol Use Disorder Identification Test Final Score (AUDIT): 9 The "Alcohol Use Disorders Identification Test", Guidelines for Use in Primary Care, Second Edition.  World Science writerHealth Organization [facility](WHO). Score between 0-7:  no or low risk or alcohol related problems. Score between 8-15:  moderate risk of alcohol related problems. Score between 16-19:  high risk of alcohol related problems. Score 20 or above:  warrants further diagnostic evaluation for alcohol dependence and treatment.   CLINICAL FACTORS:   Depression:   Comorbid alcohol abuse/dependence Severe Alcohol/Substance Abuse/Dependencies    Psychiatric Specialty Exam: Physical Exam  ROS  Blood pressure 121/77, pulse 76, temperature 98.2 F (36.8 C), temperature source Oral, resp. rate 18, height 5\' 8"  (1.727 m), weight 72 kg (158 lb 11.7 oz), last menstrual period 09/07/2016, SpO2 97 %, not currently breastfeeding.Body mass index is 24.14 kg/m.                                                    Sleep:  Number of Hours: 1.25      COGNITIVE FEATURES THAT CONTRIBUTE TO  RISK:  Polarized thinking    SUICIDE RISK:   Moderate:  Frequent suicidal ideation with limited intensity, and duration, some specificity in terms of plans, no associated intent, good self-control, limited dysphoria/symptomatology, some risk factors present, and identifiable protective factors, including available and accessible social support.  PLAN OF CARE: admit  I certify that inpatient services furnished can reasonably be expected to improve the patient's condition.   Jimmy FootmanHernandez-Gonzalez,  Zanayah Shadowens, MD 09/10/2016, 12:09 PM

## 2016-09-10 NOTE — [facility] Group Notes (Signed)
[facility] Group Notes:  (Nursing/MHT/Case Management/Adjunct)  Date:  09/10/2016  Time:  2:48 PM  Type of Therapy:  Psychoeducational Skills  Participation Level:  Did Not Attend  Twanna Hymanda C Kadedra Vanaken 09/10/2016, 2:48 PM

## 2016-09-10 NOTE — Progress Notes (Signed)
Recreation Therapy Notes  At approximately 11:15 am, LRT attempted assessment. Patient was sleeping and did not wake up when name was called.  Jacquelynn CreeGreene,Asmaa Tirpak M, LRT/CTRS 09/10/2016 1:19 PM

## 2016-09-11 LAB — HEMOGLOBIN A1C
HEMOGLOBIN A1C: 5.2 % (ref 4.8–5.6)
Mean Plasma Glucose: 103 mg/dL

## 2016-09-11 LAB — PROLACTIN: Prolactin: 10.7 ng/mL (ref 4.8–23.3)

## 2016-09-11 MED ORDER — TRAZODONE HCL 50 MG PO TABS
150.0000 mg | ORAL_TABLET | Freq: Every day | ORAL | Status: DC
  Administered 2016-09-11: 21:00:00 150 mg via ORAL
  Filled 2016-09-11: qty 1

## 2016-09-11 NOTE — Progress Notes (Signed)
Recreation Therapy Notes  INPATIENT RECREATION THERAPY ASSESSMENT  Patient Details Name: Shelley Graham MRN: [id number] DOB: 09/26/1995 Today's Date: 09/11/2016  Patient Stressors: Family, Death, Work, Other (Comment) (Not a good relationship with family - doe snot get along; grandpa died recently; lost job beacuse she was not showing up anymore; lost everything she has)  Coping Skills:   Isolate, Arguments, Substance Abuse, Avoidance, Music, Sports  Personal Challenges: Anger, Communication, Concentration, Decision-Making, Expressing Yourself, Problem-Solving, Relationships, Self-Esteem/Confidence, Social Interaction, Stress Management, Substance Abuse, Trusting Others  Leisure Interests (2+):  Music - Listen, Individual - Other (Comment) (Hangout with friends)  Awareness of Community Resources:  Yes  Community Resources:  [employer]  Current Use: No  If no, Barriers?: Other (Comment) (Drugs)  Patient Strengths:  "I really don't know."  Patient Identified Areas of Improvement:  Everything  Current Recreation Participation:  Getting high  Patient Goal for Hospitalization:  Ovrcome her addiction and depression  [place]  [place] Residence:  [place]   Current SI (including self-harm):  No  Current HI:  No  Consent to Intern Participation: N/A   Jacquelynn CreeGreene,Kaytlan Behrman M, LRT/CTRS 09/11/2016, 2:25 PM
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
## 2016-09-11 NOTE — B[facility] Group Notes (Signed)
B[facility] LCSW Group Therapy  09/11/2016 10:25 AM  Type of Therapy:  Group Therapy  Participation Level:  Active  Participation Quality:  Attentive  Affect:  Appropriate  Cognitive:  Alert  Insight:  Improving  Engagement in Therapy:  Improving  Modes of Intervention:  Activity, Discussion, Education, Socialization and Support  Summary of Progress/Problems: Balance in life: Patients will discuss the concept of balance and how it looks and feels to be unbalanced. Pt will identify areas in their life that is unbalanced and ways to become more balanced.   Greg Eckrich L Syra Sirmons MSW, LCSWA  09/11/2016, 10:25 AM

## 2016-09-11 NOTE — Progress Notes (Signed)
D: Patient stated sleptfair last night .Stated appetite is good and energy level low. Stated concentration is good . Stated on Depression scale  8, hopeless 7 and anxiety 8 .( low 0-10 high) Denies suicidal  homicidal ideations  .  No auditory hallucinations Voice of pain concerns  With back . Appropriate ADL'S. Interacting with peers and staff. Voice  Of withdrawal symptoms  Diarrhea craving  Agitation  Irritability  Voice of her focus dtoday work on Depression and addiction  A: Encourage patient participation with unit programming . Instruction  Given on  Medication , verbalize understanding. R: Voice no other concerns. Staff continue to monitor

## 2016-09-11 NOTE — Progress Notes (Signed)
Recreation Therapy Notes  Date: 05.16.18 Time: 9:30 am Location: Craft Room  Group Topic: Self-esteem  Goal Area(s) Addresses:  Patient will write at least one positive trait about self. Patient will verbalize benefit of having a healthy self-esteem.  Behavioral Response: Attentive, Interactive  Intervention: I Am  Activity: Patients were given a worksheet with the letter I on it and were instructed to write as many positive traits about themselves inside the letter.  Education: LRT educated patients on ways they can increase their self-esteem.  Education Outcome: Acknowledges education/In group clarification offered  Clinical Observations/Feedback: Patient wrote positive traits about self. Patient contributed to group discussion by stating it was difficult to think of positive traits and why.   Jacquelynn CreeGreene,Issaih Kaus M, LRT/CTRS 09/11/2016 1:49 PM
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
## 2016-09-11 NOTE — Plan of Care (Signed)
Problem: Activity: Goal: Sleeping patterns will improve Outcome: Progressing Patient slept for Estimated Hours of 7; every 15 minutes safety round maintained, no injury or falls during this shift.    

## 2016-09-11 NOTE — Plan of Care (Signed)
Problem: Coping: Goal: Ability to cope will improve Outcome: Progressing Working on coping skills   

## 2016-09-11 NOTE — Progress Notes (Signed)
Patient ID: Shelley Graham, female   DOB: 10/17/1995, 21 y.o.   MRN: [id number] Initially observed in her room, responded spontaneously to prompt and pleasant, "not feeling good today, I don't know..." c/o 7/10 HA and stated partial relief on re-assessment; sad mood, hopeless, helpless, did not say much about her 410 months old child, "I don't know where I will be going when I leave here .Marland Kitchen..." Denied SI/HI/AVH. Complied with bedtime medication.

## 2016-09-11 NOTE — BHH Group Notes (Signed)
[facility] Group Notes:  (Nursing/MHT/Case Management/Adjunct)  Date:  09/11/2016  Time:  4:09 PM  Type of Therapy:  Psychoeducational Skills  Participation Level:  Minimal  Participation Quality:  Attentive and Resistant  Affect:  Flat  Cognitive:  Appropriate  Insight:  Good  Engagement in Group:  Poor  Modes of Intervention:  Discussion and Education  Summary of Progress/Problems:  Shelley Graham 09/11/2016, 4:09 PM

## 2016-09-12 MED ORDER — TRAZODONE HCL 50 MG PO TABS
150.0000 mg | ORAL_TABLET | Freq: Every day | ORAL | Status: DC
Start: 2016-09-12 — End: 2016-09-13
  Administered 2016-09-12: 20:00:00 150 mg via ORAL
  Filled 2016-09-12: qty 1

## 2016-09-12 MED ORDER — MELATONIN 5 MG PO TABS
5.0000 mg | ORAL_TABLET | ORAL | Status: DC
  Administered 2016-09-12: 5 mg via ORAL
  Filled 2016-09-12 (×2): qty 1

## 2016-09-12 MED ORDER — NON FORMULARY: 5.0000 mg | Freq: Every day | Status: DC

## 2016-09-12 NOTE — Progress Notes (Signed)
Recreation Therapy Notes  Date: 05.17.18 Time: 1:00 pm Location: Craft Room  Group Topic: Leisure Education  Goal Area(s) Addresses:  Patient will identify activities for each letter of the alphabet. Patient will verbalize ability to integrate positive leisure into life post d/c. Patient will verbalize ability to use leisure as a Associate Professorcoping skill.  Behavioral Response: Attentive, Interactive  Intervention: Leisure Alphabet  Activity: Patients were given a Leisure Information systems managerAlphabet worksheet and were instructed to write healthy leisure activities for each letter of the alphabet.   Education: LRT educated patient on what they need to participate in leisure.  Education Outcome: Acknowledges education/In group clarification offered   Clinical Observations/Feedback: Patient wrote healthy leisure activities. Patient contributed to group discussion by stating some of her healthy leisure activities, and what makes leisure a good coping skill.  Jacquelynn CreeGreene,Tonga Prout M, LRT/CTRS 09/12/2016 2:08 PM

## 2016-09-12 NOTE — Plan of Care (Signed)
Problem: Coping: Goal: Ability to verbalize frustrations and anger appropriately will improve Outcome: Progressing Working on coping skills    

## 2016-09-12 NOTE — Progress Notes (Signed)
Patient ID: Shelley Graham, female   DOB: 05/10/1995, 21 y.o.   MRN: [id number] Improved in mood and affect, more involved and interacting well with peers, "my mom came to see me today, they found a place for me, I have an interview tomorrow ..." Hopefull, cheerful, optimistic, denied current SI/HI/AVH.

## 2016-09-12 NOTE — Tx Team (Signed)
Interdisciplinary Treatment and Diagnostic Plan Update  09/12/2016 Time of Session: 1100 Shelley Graham MRN: [id number]  Principal Diagnosis: Severe recurrent major depression without psychotic features [facility](HCC)  Secondary Diagnoses: Principal Problem:   Severe recurrent major depression without psychotic features (HCC) Active Problems:   Cannabis use disorder, severe, dependence (HCC)   Cocaine use disorder, severe, dependence (HCC)   Tobacco use disorder   Alcohol use disorder, moderate, dependence (HCC)   Current Medications:  Current Facility-Administered Medications  Medication Dose Route Frequency Provider Last Rate Last Dose  . acetaminophen (TYLENOL) tablet 650 mg  650 mg Oral Q6H PRN Clapacs, Jackquline DenmarkJohn T, MD   650 mg at 09/11/16 2238  . alum & mag hydroxide-simeth (MAALOX/MYLANTA) 200-200-20 MG/5ML suspension 30 mL  30 mL Oral Q4H PRN Clapacs, John T, MD      . FLUoxetine (PROZAC) capsule 10 mg  10 mg Oral Daily Jimmy FootmanHernandez-Gonzalez, Andrea, MD   10 mg at 09/12/16 0859  . magnesium hydroxide (MILK OF MAGNESIA) suspension 30 mL  30 mL Oral Daily PRN Clapacs, John T, MD      . Melatonin TABS 5 mg  5 mg Oral Q24H Hernandez-Gonzalez, Andrea, MD      . nicotine (NICODERM CQ - dosed in mg/24 hours) patch 21 mg  21 mg Transdermal Daily Jimmy FootmanHernandez-Gonzalez, Andrea, MD   21 mg at 09/12/16 0901  . traZODone (DESYREL) tablet 150 mg  150 mg Oral Daily Jimmy FootmanHernandez-Gonzalez, Andrea, MD       PTA Medications: No prescriptions prior to admission.    Patient Stressors: Paediatric nurseinancial difficulties Legal issue Marital or family conflict Occupational concerns Substance abuse  Patient Strengths: Capable of independent living Manufacturing systems engineerCommunication skills Physical Health Work skills  Treatment Modalities: Medication Management, Group therapy, Case management,  1 to 1 session with clinician, Psychoeducation, Recreational therapy.   Physician Treatment Plan for Primary Diagnosis: Severe recurrent major  depression without psychotic features (HCC) Long Term Goal(s): Improvement in symptoms so as ready for discharge Improvement in symptoms so as ready for discharge   Short Term Goals: Ability to identify changes in lifestyle to reduce recurrence of condition will improve Ability to disclose and discuss suicidal ideas Ability to identify and develop effective coping behaviors will improve Ability to identify triggers associated with substance abuse/mental health issues will improve  Medication Management: Evaluate patient's response, side effects, and tolerance of medication regimen.  Therapeutic Interventions: 1 to 1 sessions, Unit Group sessions and Medication administration.  Evaluation of Outcomes: Progressing  Physician Treatment Plan for Secondary Diagnosis: Principal Problem:   Severe recurrent major depression without psychotic features (HCC) Active Problems:   Cannabis use disorder, severe, dependence (HCC)   Cocaine use disorder, severe, dependence (HCC)   Tobacco use disorder   Alcohol use disorder, moderate, dependence (HCC)  Long Term Goal(s): Improvement in symptoms so as ready for discharge Improvement in symptoms so as ready for discharge   Short Term Goals: Ability to identify changes in lifestyle to reduce recurrence of condition will improve Ability to disclose and discuss suicidal ideas Ability to identify and develop effective coping behaviors will improve Ability to identify triggers associated with substance abuse/mental health issues will improve     Medication Management: Evaluate patient's response, side effects, and tolerance of medication regimen.  Therapeutic Interventions: 1 to 1 sessions, Unit Group sessions and Medication administration.  Evaluation of Outcomes: Progressing   RN Treatment Plan for Primary Diagnosis: Severe recurrent major depression without psychotic features (HCC) Long Term Goal(s): Knowledge of disease and therapeutic  regimen to  maintain health will improve  Short Term Goals: Ability to verbalize feelings will improve, Ability to identify and develop effective coping behaviors will improve and Compliance with prescribed medications will improve  Medication Management: RN will administer medications as ordered by provider, will assess and evaluate patient's response and provide education to patient for prescribed medication. RN will report any adverse and/or side effects to prescribing provider.  Therapeutic Interventions: 1 on 1 counseling sessions, Psychoeducation, Medication administration, Evaluate responses to treatment, Monitor vital signs and CBGs as ordered, Perform/monitor CIWA, COWS, AIMS and Fall Risk screenings as ordered, Perform wound care treatments as ordered.  Evaluation of Outcomes: Progressing   LCSW Treatment Plan for Primary Diagnosis: Severe recurrent major depression without psychotic features (HCC) Long Term Goal(s): Safe transition to appropriate next level of care at discharge, Engage patient in therapeutic group addressing interpersonal concerns.  Short Term Goals: Engage patient in aftercare planning with referrals and resources and Facilitate patient progression through stages of change regarding substance use diagnoses and concerns  Therapeutic Interventions: Assess for all discharge needs, 1 to 1 time with Social worker, Explore available resources and support systems, Assess for adequacy in community support network, Educate family and significant other(s) on suicide prevention, Complete Psychosocial Assessment, Interpersonal group therapy.  Evaluation of Outcomes: Progressing   Progress in Treatment: Attending groups: Yes. Participating in groups: Yes. Taking medication as prescribed: Yes. Toleration medication: Yes. Family/Significant other contact made: Yes, individual(s) contacted:  mother Patient understands diagnosis: Yes. Discussing patient identified problems/goals with  staff: Yes. Medical problems stabilized or resolved: Yes. Denies suicidal/homicidal ideation: Yes. Issues/concerns per patient self-inventory: No. Other: none  New problem(s) identified: No, Describe:  none  New Short Term/Long Term Goal(s): Pt goal is to overcome my depression and addiction.  Discharge Plan or Barriers: [facility] and outpatient treatment.  Reason for Continuation of Hospitalization: Depression Medication stabilization  Estimated Length of Stay: 1-2 days.  Attendees: Patient: Shelley Graham 09/12/2016   Physician: Dr. Ardyth Harps, MD 09/12/2016   Nursing: Hulan Amato, RN 09/12/2016   RN Care Manager: 09/12/2016   Social Worker: Daleen Squibb, LCSW 09/12/2016   Recreational Therapist: Hershal Coria, LRT/CTRS  09/12/2016   Other:  09/12/2016   Other:  09/12/2016   Other: 09/12/2016     Scribe for Treatment Team: Lorri Frederick, LCSW 09/12/2016 12:38 PM

## 2016-09-12 NOTE — Progress Notes (Signed)
D: Patient stated slept poor  last night .Stated appetite  Fair and energy level low. Stated concentration is poor. Stated on Depression scale 6 , hopeless 6 and anxiety 6 .( low 0-10 high) Denies suicidal  homicidal ideations  .  No auditory hallucinations  No pain concerns . Appropriate ADL'S. Interacting with peers and staff.  Voice she was going to better herself by socialization  And coping  Skills . A: Encourage patient participation with unit programming . Instruction  Given on  Medication , verbalize understanding. R: Voice no other concerns. Staff continue to monitor

## 2016-09-12 NOTE — [facility] Group Notes (Signed)
Goals Group Date/Time: 09/12/2016 9:00 AM Type of Therapy and Topic: Group Therapy: Goals Group: SMART Goals   Participation Level: Moderate  Description of Group:    The purpose of a daily goals group is to assist and guide patients in setting recovery/wellness-related goals. The objective is to set goals as they relate to the crisis in which they were admitted. Patients will be using SMART goal modalities to set measurable goals. Characteristics of realistic goals will be discussed and patients will be assisted in setting and processing how one will reach their goal. Facilitator will also assist patients in applying interventions and coping skills learned in psycho-education groups to the SMART goal and process how one will achieve defined goal.   Therapeutic Goals:   -Patients will develop and document one goal related to or their crisis in which brought them into treatment.  -Patients will be guided by LCSW using SMART goal setting modality in how to set a measurable, attainable, realistic and time sensitive goal.  -Patients will process barriers in reaching goal.  -Patients will process interventions in how to overcome and successful in reaching goal.   Patient's Goal: to overcome my depression by talking to MD and to work [facility].   Therapeutic Modalities:  Motivational Interviewing  Research officer, political partyCognitive Behavioral Therapy  Crisis Intervention Model  SMART goals setting   Daleen SquibbGreg Taje Tondreau, [place]

## 2016-09-12 NOTE — Plan of Care (Signed)
Problem: Activity: Goal: Sleeping patterns will improve Outcome: Progressing Patient slept for Estimated Hours of 6.45; every 15 minutes safety round maintained, no injury or falls during this shift.    
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
## 2016-09-12 NOTE — [facility] Group Notes (Signed)
[facility] Group Notes:  (Nursing/MHT/Case Management/Adjunct)  Date:  09/12/2016  Time:  4:04 PM  Type of Therapy:  Psychoeducational Skills  Participation Level:  Active  Participation Quality:  Appropriate and Attentive  Affect:  Appropriate  Cognitive:  Alert and Appropriate  Insight:  Appropriate  Engagement in Group:  Engaged and Supportive  Modes of Intervention:  Discussion, Education, Exploration and Support  Summary of Progress/Problems:  Shelley Graham W [facility]MACK 09/12/2016, 4:04 PM

## 2016-09-12 NOTE — [facility] Group Notes (Signed)
[facility] Group Notes:  (Nursing/MHT/Case Management/Adjunct)  Date:  09/12/2016  Time:  5:30 AM  Type of Therapy:  Group Therapy  Participation Level:  Active  Participation Quality:  Appropriate  Affect:  Appropriate  Cognitive:  Alert  Insight:  Good and Improving  Engagement in Group:  Engaged  Modes of Intervention:  n/a  Summary of Progress/Problems:  Veva Holesshley Imani Leanord Thibeau 09/12/2016, 5:30 AM

## 2016-09-12 NOTE — [facility] Group Notes (Signed)
[facility] LCSW Group Therapy Note  Date/Time: 09/12/16, 0930  Type of Therapy/Topic:  Group Therapy:  Balance in Life  Participation Level:  minimal  Description of Group:    This group will address the concept of balance and how it feels and looks when one is unbalanced. Patients will be encouraged to process areas in their lives that are out of balance, and identify reasons for remaining unbalanced. Facilitators will guide patients utilizing problem- solving interventions to address and correct the stressor making their life unbalanced. Understanding and applying boundaries will be explored and addressed for obtaining  and maintaining a balanced life. Patients will be encouraged to explore ways to assertively make their unbalanced needs known to significant others in their lives, using other group members and facilitator for support and feedback.  Therapeutic Goals: 1. Patient will identify two or more emotions or situations they have that consume much of in their lives. 2. Patient will identify signs/triggers that life has become out of balance:  3. Patient will identify two ways to set boundaries in order to achieve balance in their lives:  4. Patient will demonstrate ability to communicate their needs through discussion and/or role plays  Summary of Patient Progress: Pt identified mental/emotional and substance use as areas that are out of balance currently.  Pt participated in group discussion regarding entering treatment in order to get her life free of substances and back in balance.          Therapeutic Modalities:   Cognitive Behavioral Therapy Solution-Focused Therapy Assertiveness Training  Daleen SquibbGreg Toney Difatta, [place]
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
## 2016-09-12 NOTE — Plan of Care (Signed)
Problem: [facility] Participation in Recreation Therapeutic Interventions Goal: STG-Patient will demonstrate improved self esteem by identif STG: Self-Esteem - Within 4 treatment sessions, patient will verbalize at least 5 positive affirmation statements in each of 2 treatment sessions to increase self-esteem.  Outcome: Progressing Treatment Session 1; Completed 1 out of 2: At approximately 3:15 pm, LRT met with patient in consultation room. Patient verbalized 5 positive affirmation statements. Patient reported it felt "good". LRT encouraged patient to continue saying positive affirmation statements.  Leonette Monarch, LRT/CTRS 05.17.18 4:04 pm Goal: STG-Patient will identify at least five coping skills for ** STG: Coping Skills - Within 4 treatment sessions, patient will verbalize at least 5 coping skills for substance abuse in each of 2 treatment sessions to decrease substance abuse.  Outcome: Progressing Treatment Session 1; Completed 1 out of 2: At approximately 3:15 pm, LRT met with patient in consultation room. Patient verbalized 5 coping skills for substance abuse. LRT educated patient on leisure and why it is important to implement it into her schedule. LRT educated and provided patient with blank schedules to help her plan her day and try to avoid using substances. LRT educated patient on healthy support systems.  Leonette Monarch, LRT/CTRS 05.17.18 4:06 pm

## 2016-09-13 MED ORDER — FLUOXETINE HCL 20 MG PO CAPS
20.0000 mg | ORAL_CAPSULE | Freq: Every day | ORAL | 0 refills | Status: DC
Start: 2016-09-14 — End: 2022-07-22

## 2016-09-13 MED ORDER — TRAZODONE HCL 150 MG PO TABS: 150.0000 mg | ORAL_TABLET | Freq: Every day | ORAL | 0 refills | Status: DC

## 2016-09-13 NOTE — Progress Notes (Signed)
CSW spoke with pt regarding the outcome of her interview with [facility].  Pt was accepted and can move in today.  Her mother can pick her up and transport her. Garner NashGregory Genova Kiner, MSW, LCSW Clinical Social Worker 09/13/2016 9:18 AM

## 2016-09-13 NOTE — [facility] Group Notes (Signed)
[facility] LCSW Group Therapy Note  Date/Time: 09/13/16, 0930  Type of Therapy and Topic:  Group Therapy:  Feelings around Relapse and Recovery  Participation Level:  Did Not Attend   Mood:  Description of Group:    Patients in this group will discuss emotions they experience before and after a relapse. They will process how experiencing these feelings, or avoidance of experiencing them, relates to having a relapse. Facilitator will guide patients to explore emotions they have related to recovery. Patients will be encouraged to process which emotions are more powerful. They will be guided to discuss the emotional reaction significant others in their lives may have to patients' relapse or recovery. Patients will be assisted in exploring ways to respond to the emotions of others without this contributing to a relapse.  Therapeutic Goals: 1. Patient will identify two or more emotions that lead to relapse for them:  2. Patient will identify two emotions that result when they relapse:  3. Patient will identify two emotions related to recovery:  4. Patient will demonstrate ability to communicate their needs through discussion and/or role plays.   Summary of Patient Progress:     Therapeutic Modalities:   Cognitive Behavioral Therapy Solution-Focused Therapy Assertiveness Training Relapse Prevention Therapy  Greg Lorilei Horan, LCSW       

## 2016-09-13 NOTE — Progress Notes (Signed)
D: Patient is aware of  Discharge this shift .Patient denies suicidal /homicidal ideations. Patient received all belongings brought in  A: No Storage medications. Writer reviewed Discharge Summary, Suicide Risk Assessment, and Transitional Record. Patient also received Prescriptions    . Aware  Of follow up appointment . R: Patient left unit with no questions  Or concerns  With family member

## 2016-09-13 NOTE — Progress Notes (Signed)
Recreation Therapy Notes  At approximately 8:57 am, LRT attempted treatment session. Patient was sleeping and did not wake up when name was called.  Jacquelynn CreeGreene,Talvin Christianson M, LRT/CTRS 09/13/2016 4:13 PM

## 2016-09-13 NOTE — [facility] Suicide Risk Assessment (Signed)
[facility]   Principal Problem: Severe recurrent major depression without psychotic features [facility](HCC) Discharge Diagnoses:  Patient Active Problem List   Diagnosis Date Noted  . Cannabis use disorder, severe, dependence (HCC) [F12.20] 09/10/2016  . Cocaine use disorder, severe, dependence (HCC) [F14.20] 09/10/2016  . Tobacco use disorder [F17.200] 09/10/2016  . Alcohol use disorder, moderate, dependence (HCC) [F10.20] 09/10/2016  . Severe recurrent major depression without psychotic features (HCC) [F33.2] 09/09/2016      Psychiatric Specialty Exam: ROS  Blood pressure 105/75, pulse 85, temperature 97.9 F (36.6 C), temperature source Oral, resp. rate 19, height 5\' 8"  (1.727 m), weight 72 kg (158 lb 11.7 oz), last menstrual period 09/07/2016, SpO2 100 %, not currently breastfeeding.Body mass index is 24.14 kg/m.                                                       Mental Status Per Nursing Assessment::   On Admission:  Suicidal ideation indicated by others  Demographic Factors:  Adolescent or young adult, Divorced or widowed, Caucasian, Low socioeconomic status and Unemployed  Loss Factors: Financial problems/change in socioeconomic status  Historical Factors: Impulsivity  Risk Reduction Factors:   Responsible for children under 818 years of age  No access to guns  Continued Clinical Symptoms:  Alcohol/Substance Abuse/Dependencies Previous Psychiatric Diagnoses and Treatments  Cognitive Features That Contribute To Risk:  Polarized thinking    Suicide Risk:  Minimal: No identifiable suicidal ideation.  Patients presenting with no risk factors but with morbid ruminations; may be classified as minimal risk based on the severity of the depressive symptoms  Follow-up Information    [facility] Go [facility].   Why:  Please attend your medication appointment with Dr Janeece RiggersSu on Tuesday, May 22, at 2:20pm.  Please  bring insurance card, photo ID, and a copy of your hospital discharge paperwork.Please attend your therapy appt with Dr. Freida BusmanAllen on 09/24/16 at 12:45pm. Contact information: [address] [place] [address] [phone]            Jimmy FootmanHernandez-Gonzalez,  Rithvik Orcutt, MD 09/13/2016, 8:36 AM

## 2016-09-13 NOTE — Plan of Care (Signed)
Problem: Activity: Goal: Sleeping patterns will improve Outcome: Progressing Patient slept for Estimated Hours of 5; every 15 minutes safety round maintained, no injury or falls during this shift.    

## 2016-09-13 NOTE — Progress Notes (Signed)
Patient ID: Shelley Graham, female   DOB: 01/12/1996, 21 y.o.   MRN: [id number] Cheerful, hopeful, optimistic, attended the Wrap Up group, denied pain, denied SI/HI/AVH, afraid to place "the Interview Call [facility], what happens if they turn me down?", writer provided support, assisted to place call from one of the Consultation's room, patient allowed privacy; excited, happy when she returned to provide the good news that she was accepted.

## 2016-09-13 NOTE — Progress Notes (Signed)
  [facility] :  Will you be returning to the same living situation after discharge:  No. [facility]. At discharge, do you have transportation home?: Yes,  mother Do you have the ability to pay for your medications: Yes,  [employer]  Release of information consent forms completed and in the chart;  Patient's signature needed at discharge.  Patient to Follow up at: Follow-up Information    [facility] Go on 09[facility].   Why:  Please attend your medication appointment with Dr Janeece RiggersSu on Tuesday, May 22, at 2:20pm.  Please bring insurance card, photo ID, and a copy of your hospital discharge paperwork.Please attend your therapy appt with Dr. Freida BusmanAllen on 09/24/16 at 12:45pm. Contact information: [address] [place] [address] [phone]           Next level of care provider has access to [facility] Link:no  Safety Planning and Suicide Prevention discussed: Yes,  with mother  Have you used any form of tobacco in the last 30 days? (Cigarettes, Smokeless Tobacco, Cigars, and/or Pipes): Yes  Has patient been referred to the Quitline?: Patient refused referral  Patient has been referred for addiction treatment: Yes  Shelley Graham, Shelley Graham , LCSW 09/13/2016, 9:19 AM

## 2016-09-13 NOTE — Progress Notes (Signed)
Recreation Therapy Notes  INPATIENT RECREATION TR PLAN  Patient Details Name: Shelley Graham MRN: [id number] DOB: 09/08/1995 Today's Date: 09/13/2016  Rec Therapy Plan Is patient appropriate for Therapeutic Recreation?: Yes Treatment times per week: At least once a week TR Treatment/Interventions: 1:1 session, Group participation (Comment) (Appropriate participation in daily recreational therapy tx)  Discharge Criteria Pt will be discharged from therapy if:: Treatment goals are met, Discharged Treatment plan/goals/alternatives discussed and agreed upon by:: Patient/family  Discharge Summary Short term goals set: See Care Plan Short term goals met: Adequate for discharge Progress toward goals comments: One-to-one attended Which groups?: Self-esteem, Leisure education One-to-one attended: Self-esteem, coping skills Reason goals not met: N/A Therapeutic equipment acquired: None Reason patient discharged from therapy: Discharge from hospital Pt/family agrees with progress & goals achieved: Yes Date patient discharged from therapy: 09/13/16   Leonette Monarch, LRT/CTRS 09/13/2016, 4:15 PM
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
## 2017-08-30 IMAGING — CR DG ABDOMEN ACUTE W/ 1V CHEST
1 series · 4 of 4 positions shown · non-contrast
Comparison: 09/10/2010

CLINICAL DATA: Abdominal pain for 4 months

EXAM:
DG ABDOMEN ACUTE W/ 1V CHEST

[Series 1: dg abd acute w/chest · 0.14mm/px · 4 of 4 slices shown]
[im 1/4]
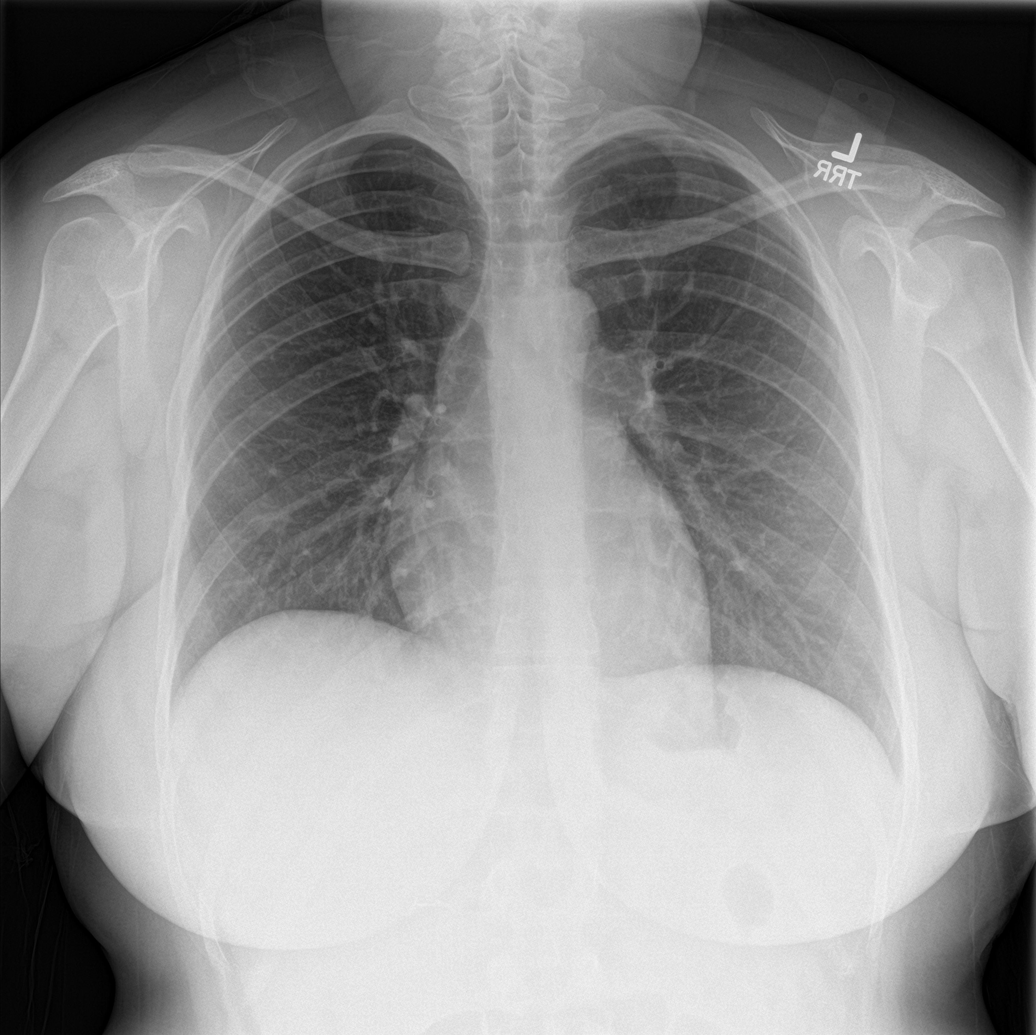
[im 2/4]
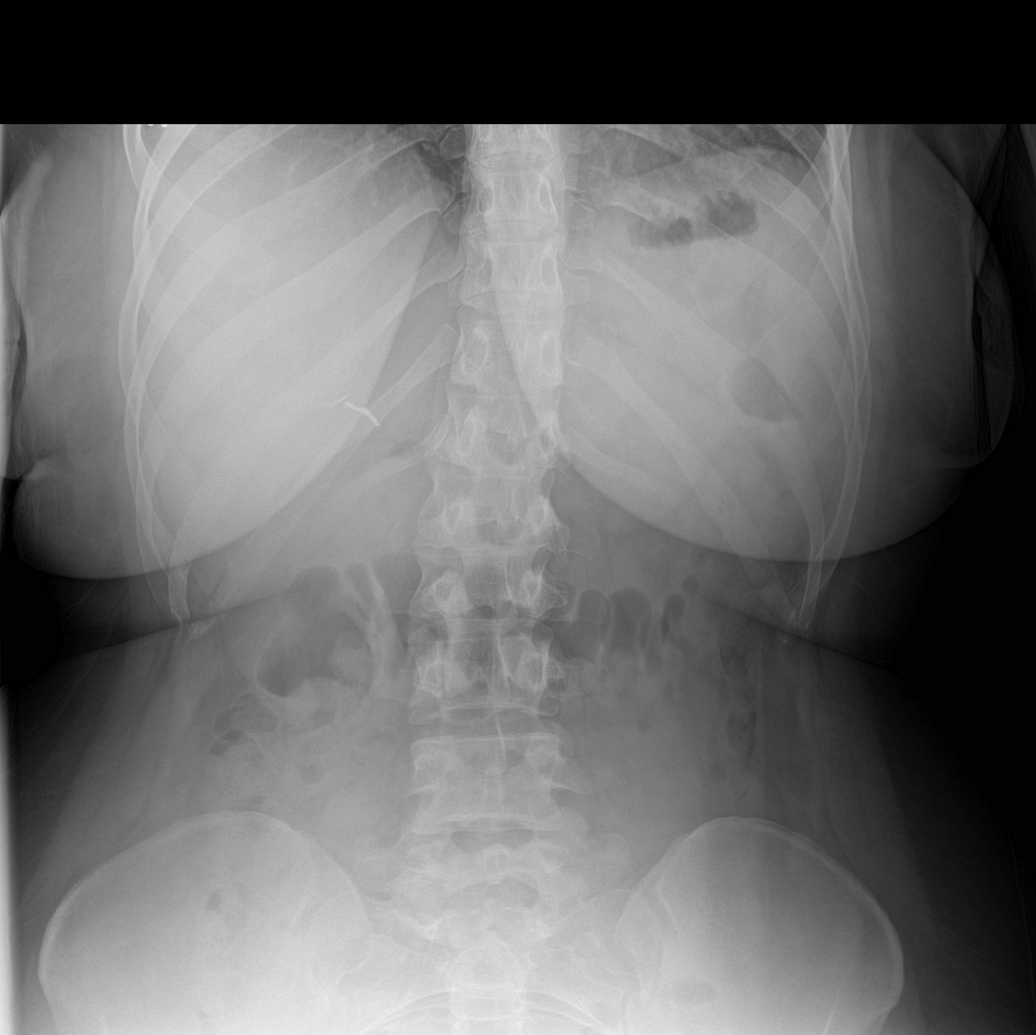
[im 3/4]
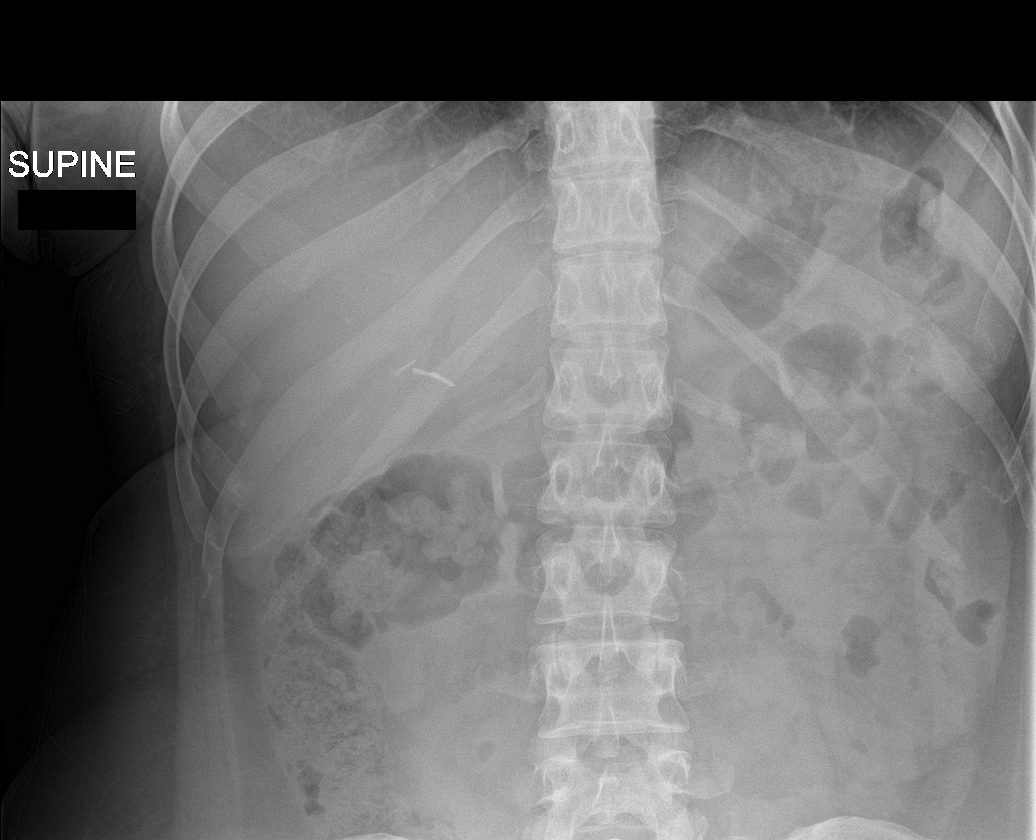
[im 4/4]
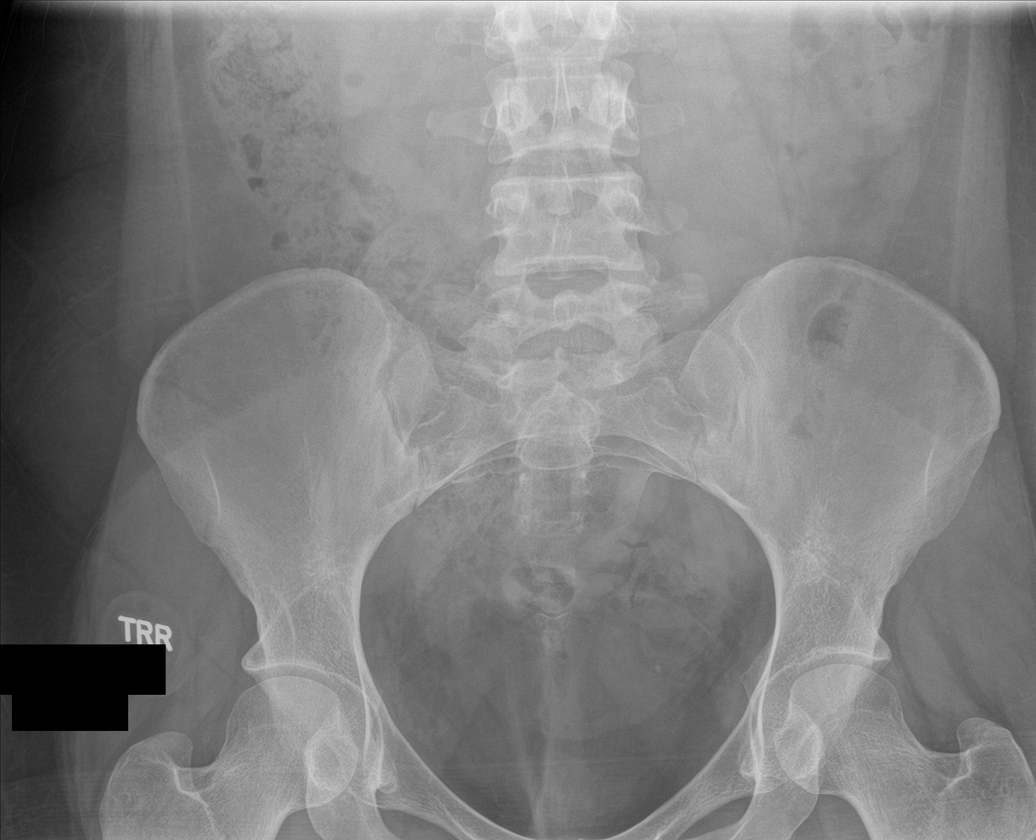

[4 of 4 positions shown; findings below may reference images not displayed]

FINDINGS: Single-view chest demonstrates no acute infiltrate or effusion.
Normal heart size.

Supine and upright views of the abdomen demonstrate no free air
beneath the diaphragm. There are surgical clips in the right upper
quadrant. There is a nonobstructed bowel gas pattern. There is a
tiny 2 mm calcification in the left pelvis.
IMPRESSION: 1. Single-view chest within normal limits
2. Nonobstructed gas pattern
3. Tiny 2 mm calcification left pelvis, suspect that this is a
phlebolith, however if symptoms are suggestive of a ureteral stone,
CT KUB could be obtained for further evaluation.
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
## 2017-12-04 IMAGING — US US ABDOMEN LIMITED
1 series · 14 of 25 positions shown · non-contrast
Comparison: 04/23/2015

CLINICAL DATA: Intermittent RIGHT upper quadrant pain for 1 day

EXAM:
US ABDOMEN LIMITED - RIGHT UPPER QUADRANT

[Series 1: us abdomen limited · 0.19mm/px · 14 of 46 slices shown]
[im 1/46]
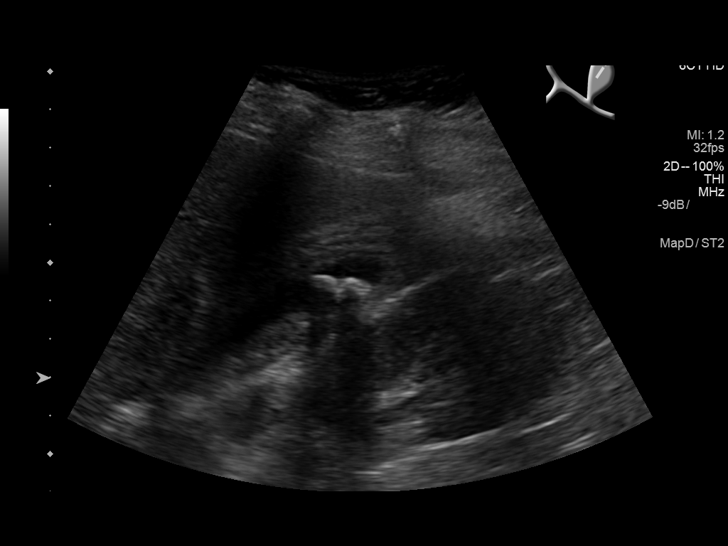
[im 4/46]
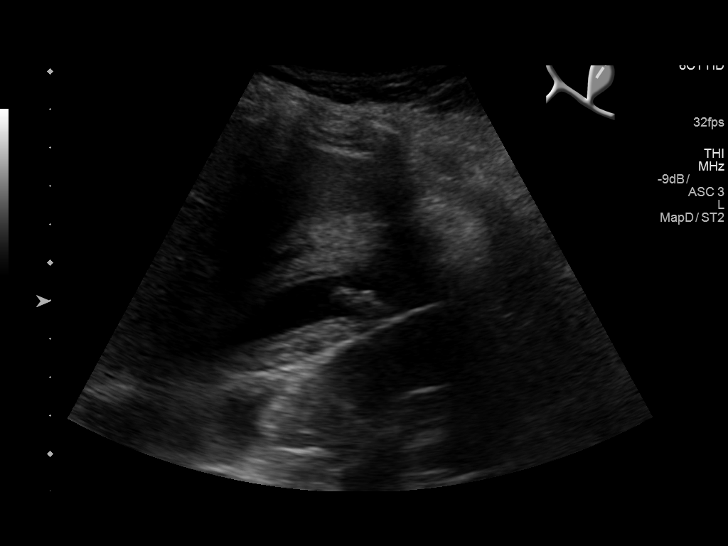
[im 8/46]
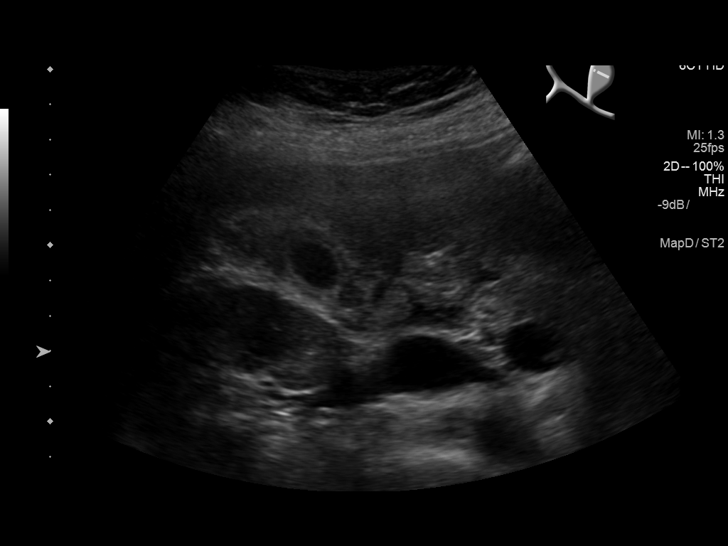
[im 12/46]
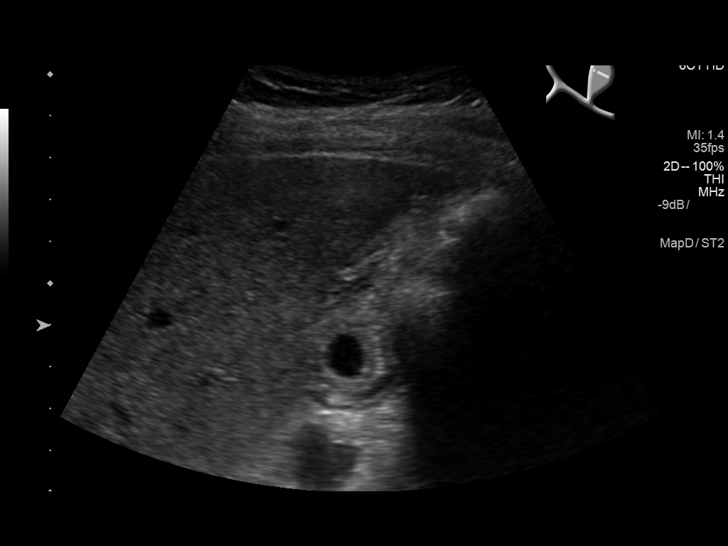
[im 16/46]
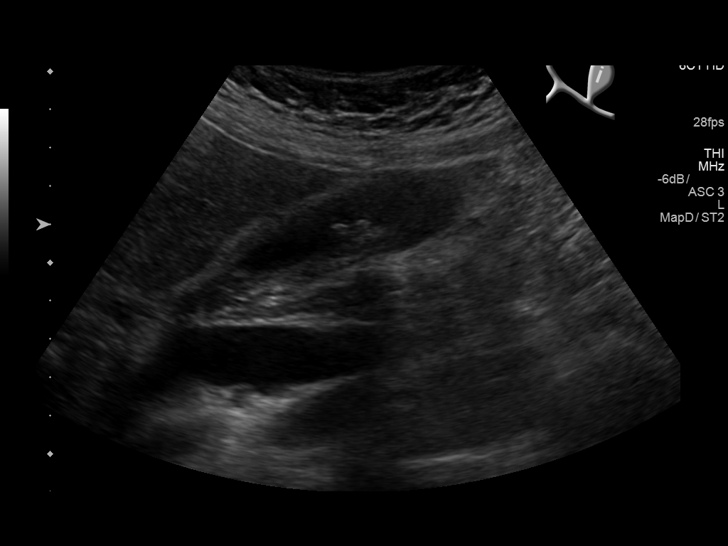
[im 17/46]
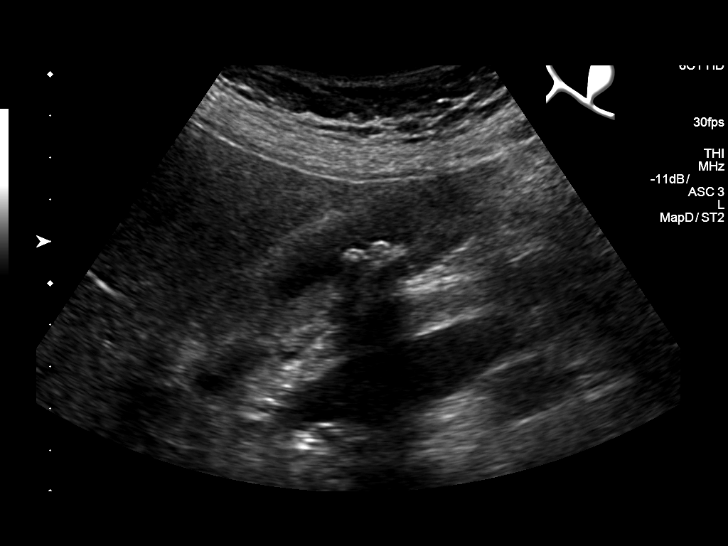
[im 21/46]
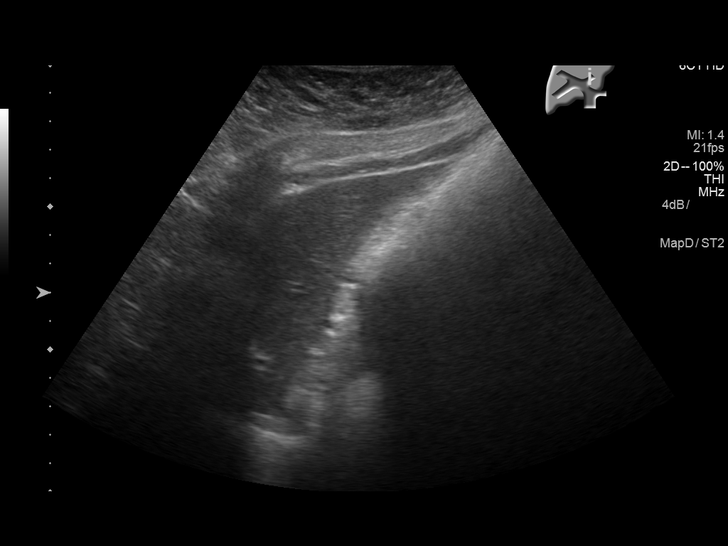
[im 25/46]
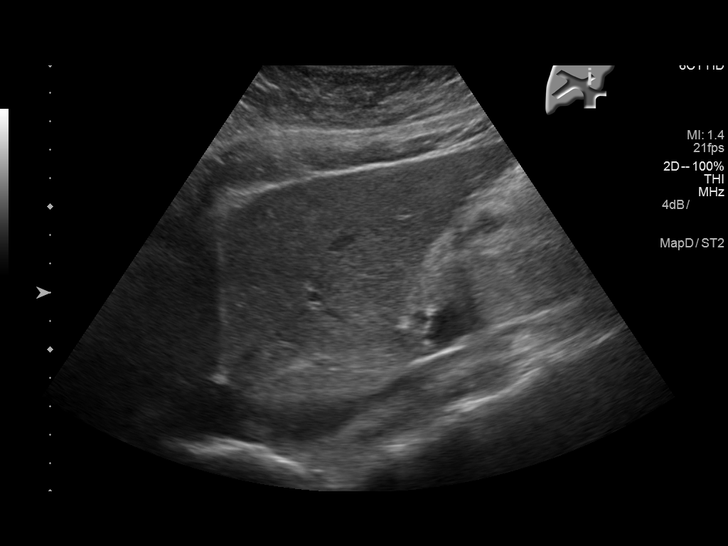
[im 29/46]
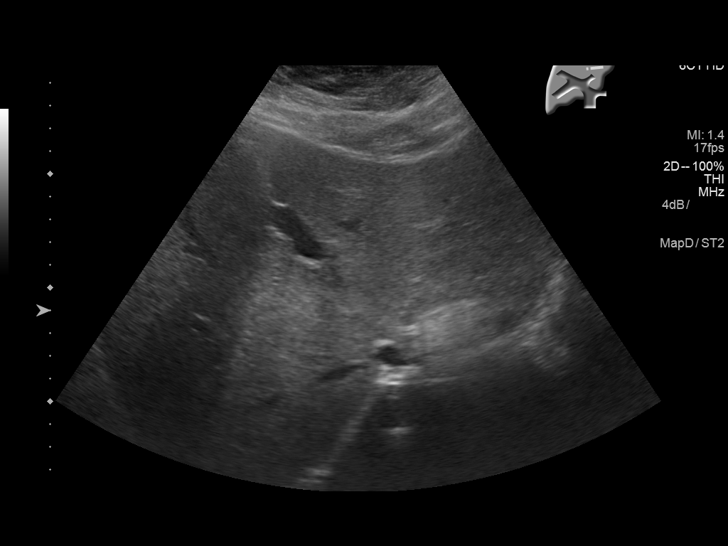
[im 31/46]
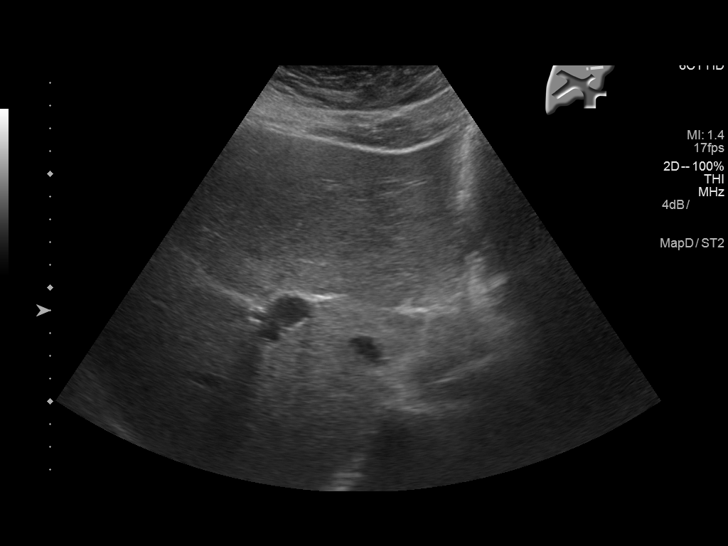
[im 34/46]
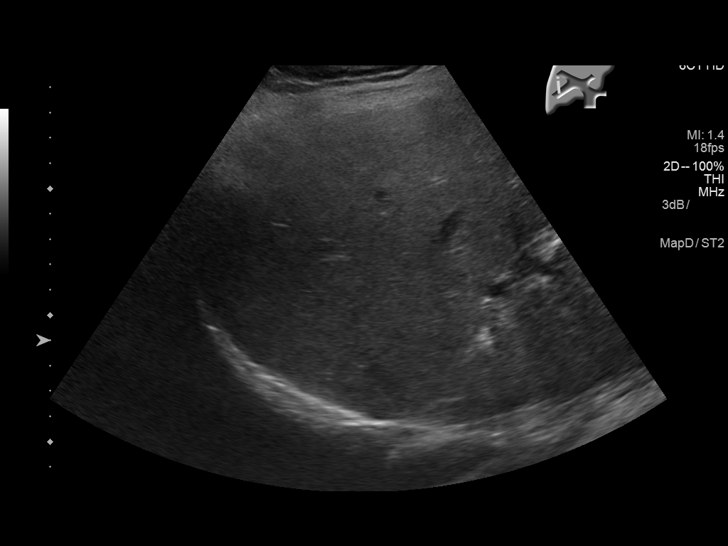
[im 38/46]
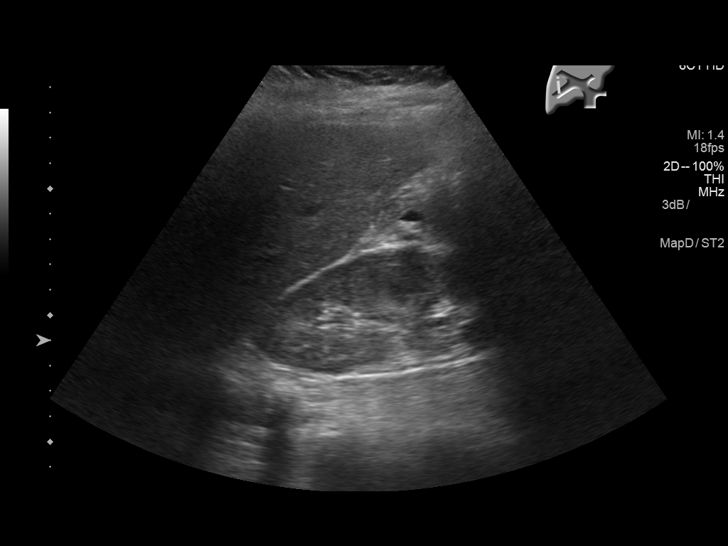
[im 42/46]
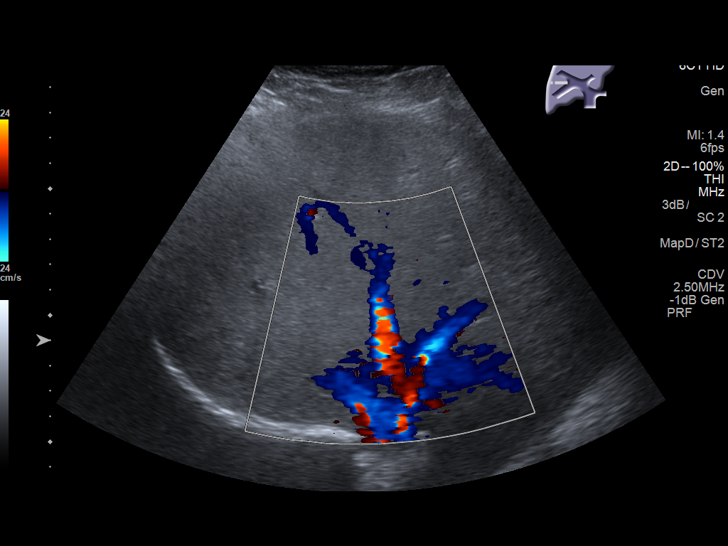
[im 46/46]
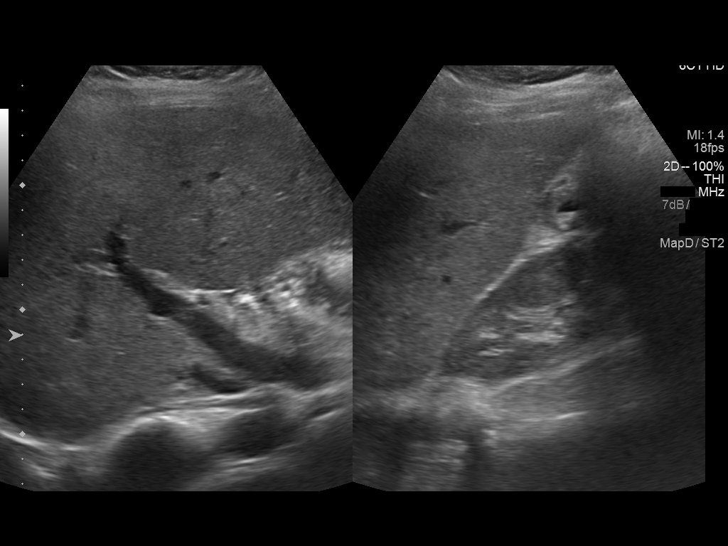

[14 of 25 positions shown; findings below may reference images not displayed]

FINDINGS: Gallbladder:

Shadowing calculi within gallbladder. Gallbladder appears
contracted, with patient drinking Dr. Arham prior to study.
Gallbladder wall appears thickened but this could be an artifact
related to contracted state and prior p.o. intake. No
pericholecystic fluid or sonographic Murphy sign.

Common bile duct:

Diameter: 3 mm diameter, normal

Liver:

Normal appearance

No RIGHT upper quadrant free fluid.
IMPRESSION: Cholelithiasis.

Contracted gallbladder question artifact related to p.o. intake
prior to exam.

No evidence of biliary dilatation or sonographic Murphy sign.

## 2017-12-19 IMAGING — US US ABDOMEN LIMITED
1 series · 14 of 25 positions shown · non-contrast
Comparison: 12/04/2015

CLINICAL DATA: Abdominal pain for 2-3 weeks

EXAM:
US ABDOMEN LIMITED - RIGHT UPPER QUADRANT

[Series 1: us abdomen limited · 0.17mm/px · 14 of 44 slices shown]
[im 1/44]
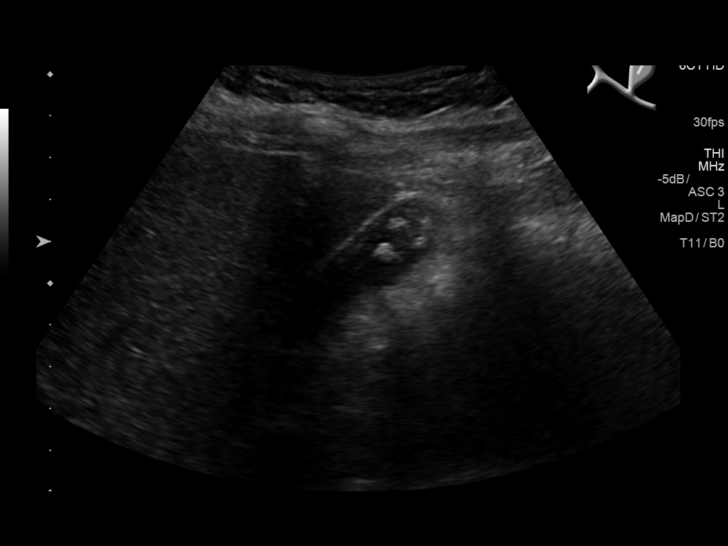
[im 4/44]
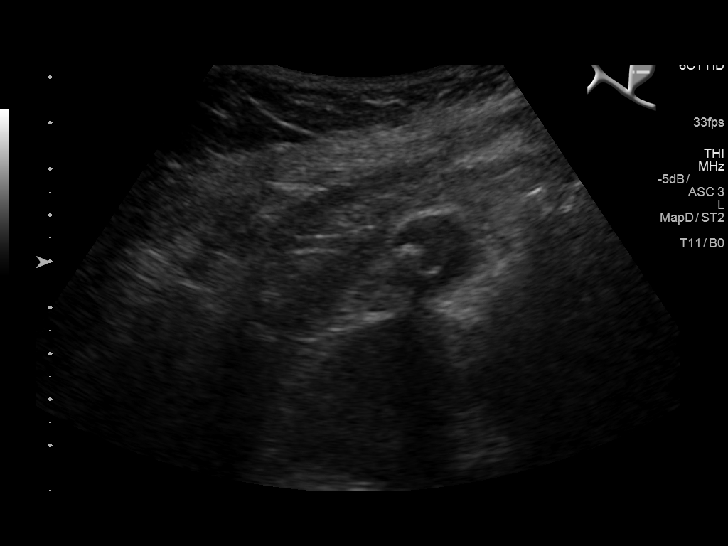
[im 8/44]
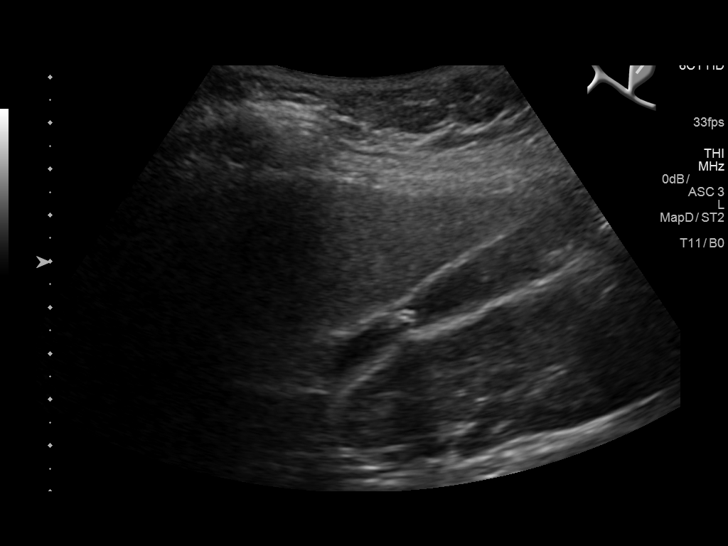
[im 11/44]
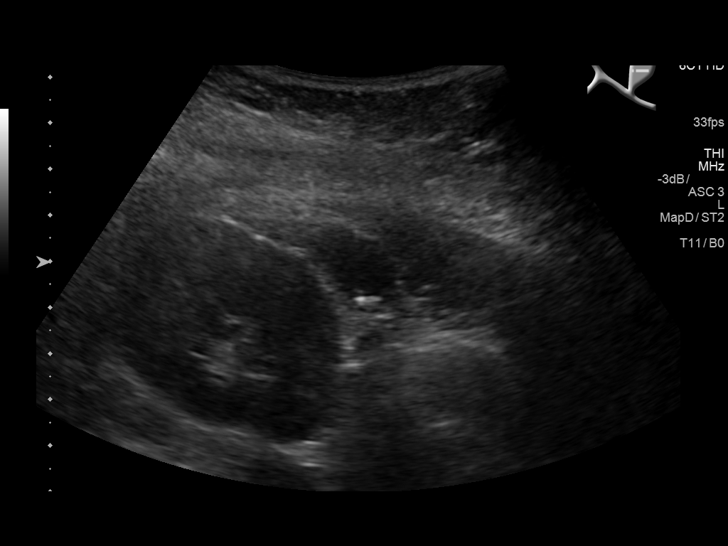
[im 15/44]
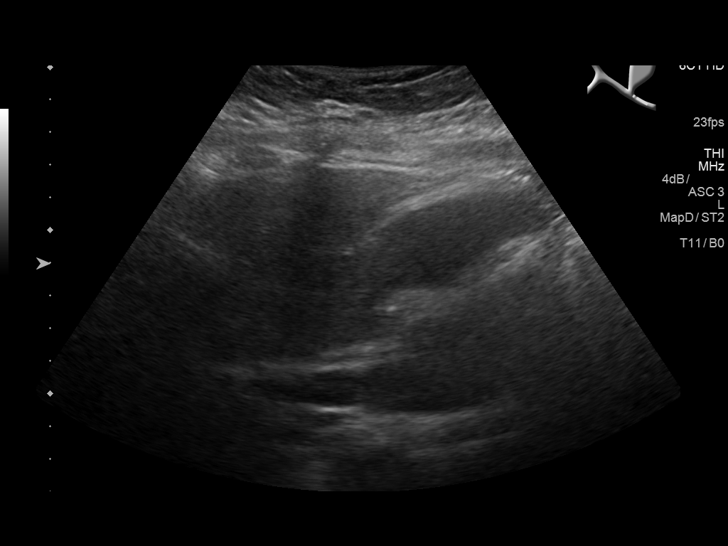
[im 17/44]
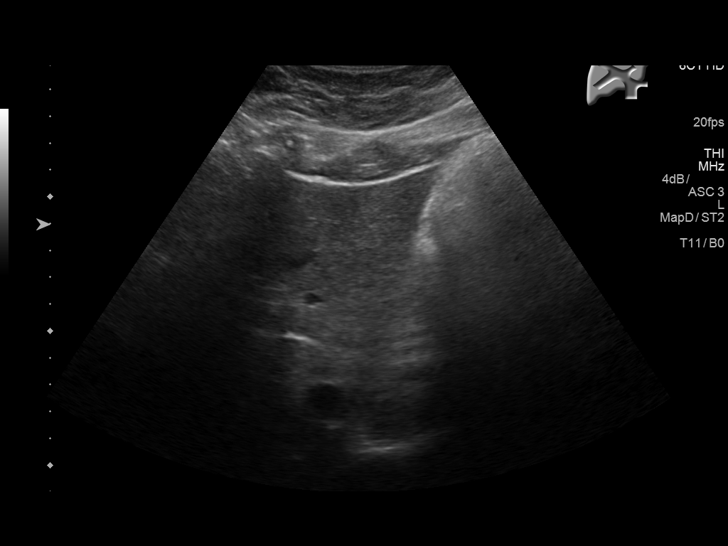
[im 20/44]
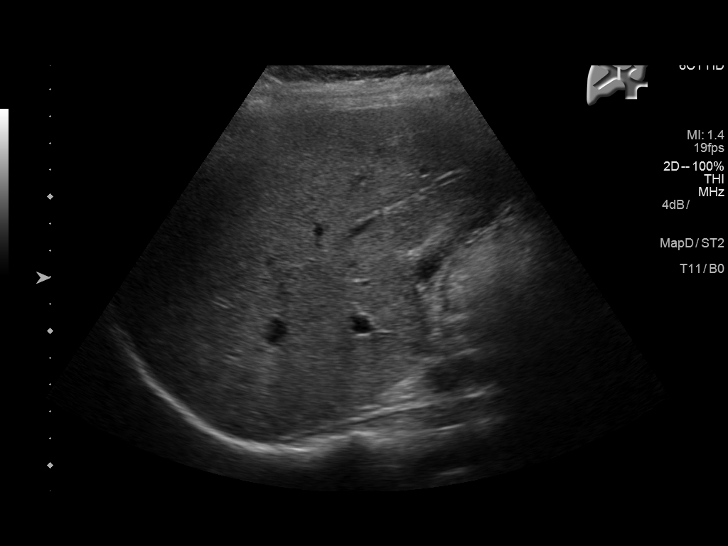
[im 24/44]
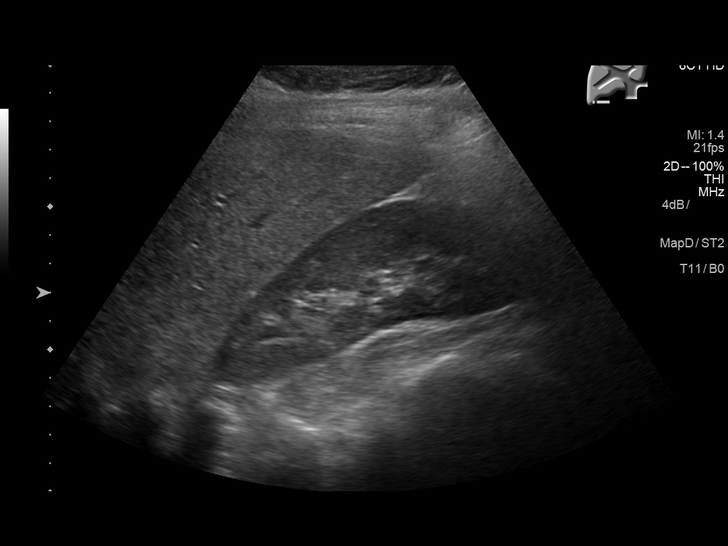
[im 27/44]
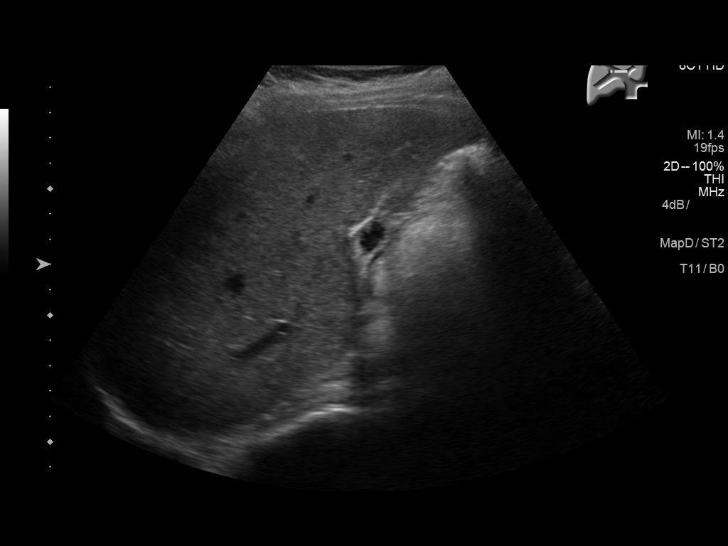
[im 29/44]
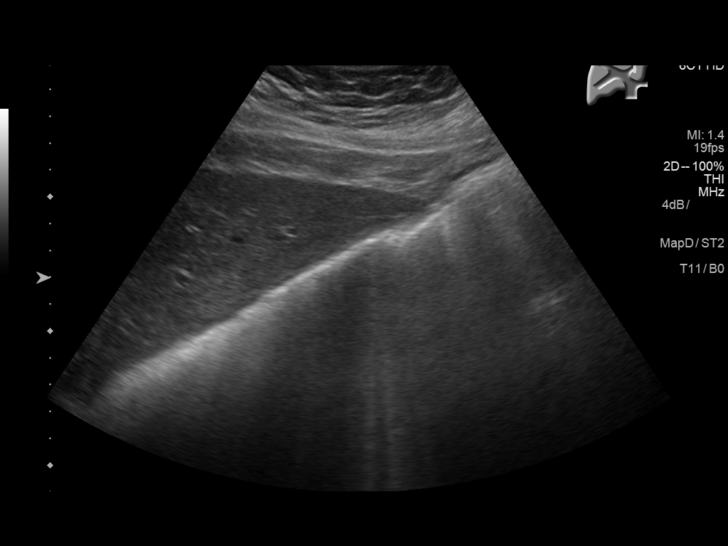
[im 33/44]
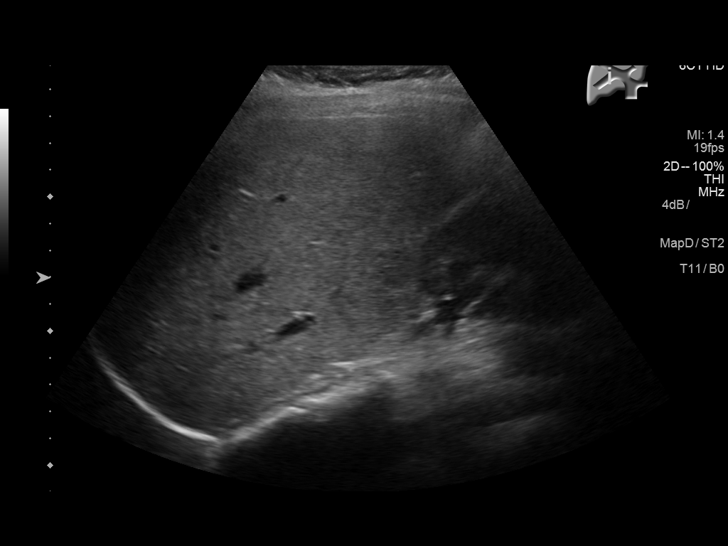
[im 36/44]
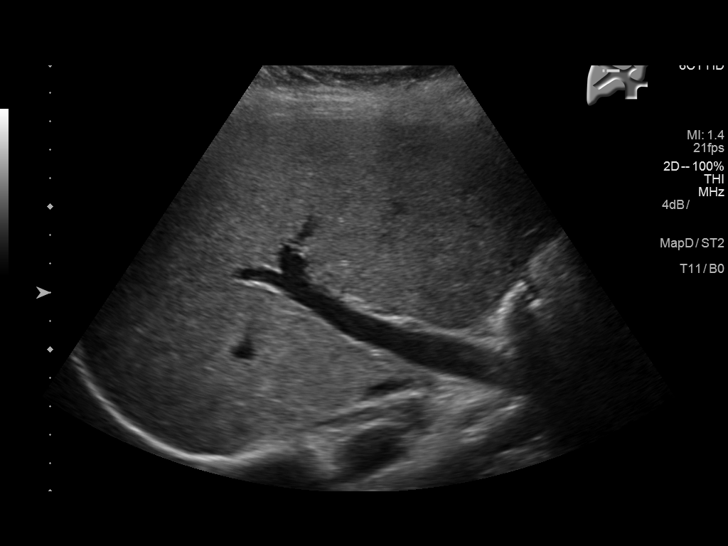
[im 40/44]
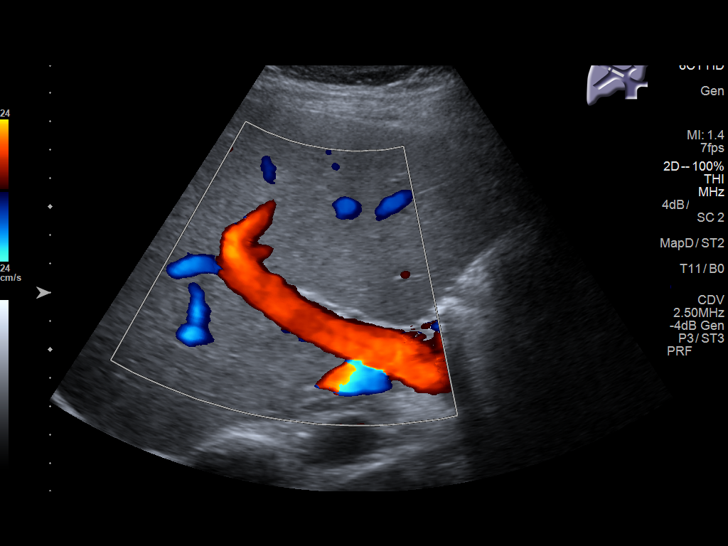
[im 44/44]
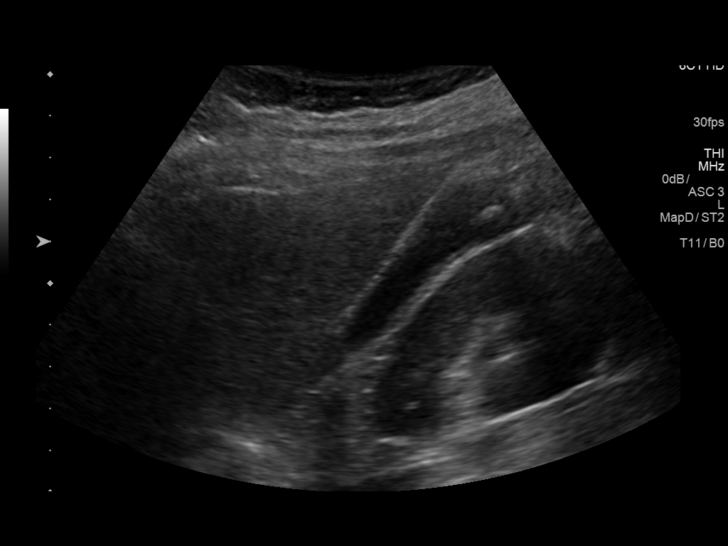

[14 of 25 positions shown; findings below may reference images not displayed]

FINDINGS: Gallbladder:

Mobile cholelithiasis. No wall thickening or focal tenderness to
suggest acute cholecystitis.

Common bile duct:

Diameter: 3 mm

Liver:

No focal lesion identified. Within normal limits in parenchymal
echogenicity. Antegrade flow in the imaged portal venous system.
IMPRESSION: Cholelithiasis without signs of cholecystitis.

## 2022-01-31 ENCOUNTER — Ambulatory Visit: Payer: Medicaid Other | Admitting: Internal Medicine

## 2022-07-19 ENCOUNTER — Other Ambulatory Visit: Payer: Self-pay

## 2022-07-19 DIAGNOSIS — Z20822 Contact with and (suspected) exposure to covid-19: Secondary | ICD-10-CM | POA: Insufficient documentation

## 2022-07-19 DIAGNOSIS — R45851 Suicidal ideations: Secondary | ICD-10-CM | POA: Insufficient documentation

## 2022-07-19 DIAGNOSIS — F332 Major depressive disorder, recurrent severe without psychotic features: Secondary | ICD-10-CM | POA: Insufficient documentation

## 2022-07-19 DIAGNOSIS — F411 Generalized anxiety disorder: Secondary | ICD-10-CM | POA: Insufficient documentation

## 2022-07-19 LAB — CBC
HCT: 42.3 % (ref 36.0–46.0)
Hemoglobin: 14.4 g/dL (ref 12.0–15.0)
MCH: 29.3 pg (ref 26.0–34.0)
MCHC: 34 g/dL (ref 30.0–36.0)
MCV: 86.2 fL (ref 80.0–100.0)
Platelets: 349 10*3/uL (ref 150–400)
RBC: 4.91 MIL/uL (ref 3.87–5.11)
RDW: 13.2 % (ref 11.5–15.5)
WBC: 9.6 10*3/uL (ref 4.0–10.5)
nRBC: 0 % (ref 0.0–0.2)

## 2022-07-19 LAB — COMPREHENSIVE METABOLIC PANEL
ALT: 31 U/L (ref 0–44)
AST: 19 U/L (ref 15–41)
Albumin: 4.1 g/dL (ref 3.5–5.0)
Alkaline Phosphatase: 79 U/L (ref 38–126)
Anion gap: 13 (ref 5–15)
BUN: 11 mg/dL (ref 6–20)
CO2: 22 mmol/L (ref 22–32)
Calcium: 9.7 mg/dL (ref 8.9–10.3)
Chloride: 103 mmol/L (ref 98–111)
Creatinine, Ser: 0.53 mg/dL (ref 0.44–1.00)
GFR, Estimated: >60 mL/min (ref >60)
Glucose, Bld: 96 mg/dL (ref 70–99)
Potassium: 3.2 mmol/L — ABNORMAL LOW (ref 3.5–5.1)
Sodium: 138 mmol/L (ref 135–145)
Total Bilirubin: 2 mg/dL — ABNORMAL HIGH (ref 0.3–1.2)
Total Protein: 7.6 g/dL (ref 6.5–8.1)

## 2022-07-19 LAB — ACETAMINOPHEN LEVEL: Acetaminophen (Tylenol), Serum: <10 ug/mL — ABNORMAL LOW (ref 10–30)

## 2022-07-19 LAB — SALICYLATE LEVEL: Salicylate Lvl: <7 mg/dL — ABNORMAL LOW (ref 7.0–30.0)

## 2022-07-19 LAB — ETHANOL: Alcohol, Ethyl (B): <10 mg/dL (ref <10)

## 2022-07-19 NOTE — ED Notes (Addendum)
Pt belongings:   Orange vape Set of keys Museum/gallery exhibitions officer Black tank top Black leggings Black bra Brown hair clip Slip on shoes Black shoes Necklace earrings
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
## 2022-07-20 ENCOUNTER — Inpatient Hospital Stay
Admission: AD | Admit: 2022-07-20 | Discharge: 2022-07-22 | DRG: 885 | Disposition: A | Discharge status: Home / Self Care | Payer: 59 | Source: Intra-hospital | Attending: Psychiatry | Admitting: Psychiatry

## 2022-07-20 ENCOUNTER — Emergency Department
Admission: EM | Admit: 2022-07-20 | Discharge: 2022-07-20 | Disposition: A | Discharge status: Other Acute Inpatient Hospital | Payer: No Typology Code available for payment source | Attending: Emergency Medicine | Admitting: Emergency Medicine

## 2022-07-20 ENCOUNTER — Other Ambulatory Visit: Payer: Self-pay

## 2022-07-20 ENCOUNTER — Encounter: Payer: Self-pay | Admitting: Psychiatry

## 2022-07-20 DIAGNOSIS — F17210 Nicotine dependence, cigarettes, uncomplicated: Secondary | ICD-10-CM | POA: Diagnosis present

## 2022-07-20 DIAGNOSIS — Z825 Family history of asthma and other chronic lower respiratory diseases: Secondary | ICD-10-CM

## 2022-07-20 DIAGNOSIS — F332 Major depressive disorder, recurrent severe without psychotic features: Principal | ICD-10-CM | POA: Diagnosis present

## 2022-07-20 DIAGNOSIS — Z801 Family history of malignant neoplasm of trachea, bronchus and lung: Secondary | ICD-10-CM

## 2022-07-20 DIAGNOSIS — G43909 Migraine, unspecified, not intractable, without status migrainosus: Secondary | ICD-10-CM | POA: Diagnosis present

## 2022-07-20 DIAGNOSIS — I10 Essential (primary) hypertension: Secondary | ICD-10-CM | POA: Diagnosis present

## 2022-07-20 DIAGNOSIS — Z800 Family history of malignant neoplasm of digestive organs: Secondary | ICD-10-CM | POA: Diagnosis not present

## 2022-07-20 DIAGNOSIS — Z8349 Family history of other endocrine, nutritional and metabolic diseases: Secondary | ICD-10-CM | POA: Diagnosis not present

## 2022-07-20 DIAGNOSIS — R4589 Other symptoms and signs involving emotional state: Secondary | ICD-10-CM

## 2022-07-20 DIAGNOSIS — F410 Panic disorder [episodic paroxysmal anxiety] without agoraphobia: Secondary | ICD-10-CM | POA: Diagnosis present

## 2022-07-20 DIAGNOSIS — R45851 Suicidal ideations: Secondary | ICD-10-CM | POA: Diagnosis present

## 2022-07-20 LAB — RESP PANEL BY RT-PCR (RSV, FLU A&B, COVID)  RVPGX2
Influenza A by PCR: NEGATIVE
Influenza B by PCR: NEGATIVE
Resp Syncytial Virus by PCR: NEGATIVE
SARS Coronavirus 2 by RT PCR: NEGATIVE

## 2022-07-20 MED ORDER — ALUM & MAG HYDROXIDE-SIMETH 200-200-20 MG/5ML PO SUSP: 30.0000 mL | ORAL | Status: DC | PRN

## 2022-07-20 MED ORDER — ACETAMINOPHEN 325 MG PO TABS: 650.0000 mg | ORAL_TABLET | Freq: Four times a day (QID) | ORAL | Status: DC | PRN

## 2022-07-20 MED ORDER — TRAZODONE HCL 50 MG PO TABS
150.0000 mg | ORAL_TABLET | Freq: Every evening | ORAL | Status: DC | PRN
  Administered 2022-07-21: 150 mg via ORAL
  Filled 2022-07-20: qty 1

## 2022-07-20 MED ORDER — DIPHENHYDRAMINE HCL 50 MG/ML IJ SOLN: 50.0000 mg | Freq: Once | INTRAMUSCULAR | Status: DC | PRN

## 2022-07-20 MED ORDER — MAGNESIUM HYDROXIDE 400 MG/5ML PO SUSP: 30.0000 mL | Freq: Every day | ORAL | Status: DC | PRN

## 2022-07-20 MED ORDER — OLANZAPINE 10 MG IM SOLR: 10.0000 mg | Freq: Once | INTRAMUSCULAR | Status: DC | PRN

## 2022-07-20 MED ORDER — POTASSIUM CHLORIDE CRYS ER 20 MEQ PO TBCR
10.0000 meq | EXTENDED_RELEASE_TABLET | Freq: Every day | ORAL | Status: DC
  Administered 2022-07-20: 10 meq via ORAL
  Filled 2022-07-20: qty 1

## 2022-07-20 MED ORDER — NICOTINE 21 MG/24HR TD PT24
21.0000 mg | MEDICATED_PATCH | Freq: Every day | TRANSDERMAL | Status: DC
  Administered 2022-07-21 – 2022-07-22 (×2): 21 mg via TRANSDERMAL
  Filled 2022-07-20 (×2): qty 1

## 2022-07-20 NOTE — ED Notes (Signed)
Breakfast tray given to pt 

## 2022-07-20 NOTE — ED Notes (Signed)
Nurse let Patient know that she would be admitted to have further evaluation and treatment, Patient is sad, states that she has court on Monday in [place] to try to get custody by of her daughter, she ask to speak to the NP again, and Nurse did talk to NP and she states that she will go speak to her again today, so currently Patient is awaiting a bed for admission. Patient does deny Si/hi or avh, states that she has never harmed herself although a few years ago she had called 911 to get help because she was feeling so depressed and having thoughts of Si, Patient states that she and her room-mate had gotten in a terrible argument and that is why she was here that they both had said things they did not mean, and her room-mate had her IVC"D. Patient admits to taking unprescribed xanax to be able to deal with her anxiety,and grief, states that her Dad died a couple of weeks back. Patient also states that she use to do hardcore drugs , but had not used anything hardcore in a long time, Nurse ask her if she thought she needed help for substance abuse and she said " not right now" she just wants to get her child back, and move on with her life. Staff will continue to monitor, q 15 minute checks and camera surveillance in progress for safety.
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
## 2022-07-20 NOTE — ED Notes (Signed)
ivc/psych consult ordered/pending.. 

## 2022-07-20 NOTE — ED Provider Notes (Signed)
[facility] Provider Note    Event Date/Time   First MD Initiated Contact with Patient 07/20/22 0124     (approximate)   History   IVC   HPI  Shelley Graham is a 27 y.o. female who presents to the ED for evaluation of IVC   Patient presents to the ED under IVC for evaluation of suicidal threats and the setting of an argument with her roommate.  I review her IVC paperwork which indicates that she was threatening to kill herself but when I speak with the patient she reports that this was "a lie" and she never said these things.  She reports getting to a verbal argument with her roommate at home, but minimizes this interaction.  She denies any recreational drugs or noncompliance with her psychiatric medications.   Physical Exam   Triage Vital Signs: ED Triage Vitals [07/19/22 1756]  Enc Vitals Group     BP (!) 133/95     Pulse Rate (!) 109     Resp 18     Temp 98.1 F (36.7 C)     Temp Source Oral     SpO2 98 %     Weight      Height      Head Circumference      [place] Flow      Pain Score 0     Pain Loc      Pain Edu?      Excl. in [place]?     Most recent vital signs: Vitals:   07/19/22 1756  BP: (!) 133/95  Pulse: (!) 109  Resp: 18  Temp: 98.1 F (36.7 C)  SpO2: 98%    General: Awake, no distress.  CV:  Good peripheral perfusion.  Resp:  Normal effort.  Abd:  No distention.  MSK:  No deformity noted.  Neuro:  No focal deficits appreciated. Other:     ED Results / Procedures / Treatments   Labs (all labs ordered are listed, but only abnormal results are displayed) Labs Reviewed  COMPREHENSIVE METABOLIC PANEL - Abnormal; Notable for the following components:      Result Value   Potassium 3.2 (*)    Total Bilirubin 2.0 (*)    All other components within normal limits  SALICYLATE LEVEL - Abnormal; Notable for the following components:   Salicylate Lvl [id number] (*)    All other components within normal limits  ACETAMINOPHEN LEVEL -  Abnormal; Notable for the following components:   Acetaminophen (Tylenol), Serum <10 (*)    All other components within normal limits  RESP PANEL BY RT-PCR (RSV, FLU A&B, COVID)  RVPGX2  ETHANOL  CBC  URINE DRUG SCREEN, QUALITATIVE ([facility] ONLY)  POC URINE PREG, ED    EKG   RADIOLOGY   Official radiology report(s): No results found.  PROCEDURES and INTERVENTIONS:  Procedures  Medications - No data to display   IMPRESSION / MDM / [place] / ED COURSE  I reviewed the triage vital signs and the nursing notes.  Differential diagnosis includes, but is not limited to, noncompliance with medications, polysubstance abuse, malingering, suicidal,  {Patient presents with symptoms of an acute illness or injury that is potentially life-threatening.  Patient presents under IVC and requiring psychiatric evaluation.  She is calm and cooperative for me.  Screening blood work with marginal hypokalemia.  Normal CBC and negative viral swab.  We will consult psychiatry.  No evidence of medical pathology to preclude psychiatric evaluation  FINAL CLINICAL IMPRESSION(S) / ED DIAGNOSES   Final diagnoses:  None     Rx / DC Orders   ED Discharge Orders     None        Note:  This document was prepared using Dragon voice recognition software and may include unintentional dictation errors.   Vladimir Crofts, MD 07/20/22 [phone]
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
## 2022-07-20 NOTE — Progress Notes (Addendum)
Pt was accepted to [facility]- Bed Assignment 304; Please fax consent form to [phone]  DX: MDD, recurrent, severe  Pt meets inpatient criteria per Waylan Boga, NP  Attending Physician will be Dr. Alethia Berthold  Report can be called to: [phone]  Pt can arrive after 1:00pm  Per [place], Provider, please include admission orders and agitation protocol. Registration, please pre-admit.   Care Team notified: [place]town Grant, RN, Caren Griffins, DO, John Clapacs, Leonidas Romberg, RN, Waylan Boga, NP, Laverta Baltimore, Waylan Boga, NP, Lyda Kalata, RN, Ernst Breach, RN  Nadara Mode, LCSWA 07/20/2022 @ 1:41 PM
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
## 2022-07-20 NOTE — Plan of Care (Signed)
New admission.  Problem: Education: Goal: Knowledge of General Education information will improve Description: Including pain rating scale, medication(s)/side effects and non-pharmacologic comfort measures Outcome: Not Progressing   Problem: Health Behavior/Discharge Planning: Goal: Ability to manage health-related needs will improve Outcome: Not Progressing   Problem: Clinical Measurements: Goal: Ability to maintain clinical measurements within normal limits will improve Outcome: Not Progressing Goal: Will remain free from infection Outcome: Not Progressing Goal: Diagnostic test results will improve Outcome: Not Progressing Goal: Respiratory complications will improve Outcome: Not Progressing Goal: Cardiovascular complication will be avoided Outcome: Not Progressing   Problem: Activity: Goal: Risk for activity intolerance will decrease Outcome: Not Progressing   Problem: Nutrition: Goal: Adequate nutrition will be maintained Outcome: Not Progressing   Problem: Coping: Goal: Level of anxiety will decrease Outcome: Not Progressing   Problem: Elimination: Goal: Will not experience complications related to bowel motility Outcome: Not Progressing Goal: Will not experience complications related to urinary retention Outcome: Not Progressing   Problem: Pain Managment: Goal: General experience of comfort will improve Outcome: Not Progressing   Problem: Safety: Goal: Ability to remain free from injury will improve Outcome: Not Progressing   Problem: Skin Integrity: Goal: Risk for impaired skin integrity will decrease Outcome: Not Progressing   Problem: Education: Goal: Knowledge of [place] General Education information/materials will improve Outcome: Not Progressing Goal: Emotional status will improve Outcome: Not Progressing Goal: Mental status will improve Outcome: Not Progressing Goal: Verbalization of understanding the information provided will  improve Outcome: Not Progressing   Problem: Safety: Goal: Periods of time without injury will increase Outcome: Not Progressing   Problem: Self-Concept: Goal: Ability to disclose and discuss suicidal ideas will improve Outcome: Not Progressing Goal: Will verbalize positive feelings about self Outcome: Not Progressing   Problem: Self-Concept: Goal: Level of anxiety will decrease Outcome: Not Progressing Goal: Ability to modify response to factors that promote anxiety will improve Outcome: Not Progressing
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
## 2022-07-20 NOTE — Progress Notes (Signed)
RN checked in with pt before bed time to ask about her day. Pt appeared tearful at time of assessment but expresses no needs at this time. Pt reported she just wanted to be alone in her room. No additional concerns.    07/20/22 2133  Psych Admission Type (Psych Patients Only)  Admission Status Involuntary  Psychosocial Assessment  Patient Complaints Anxiety;Worrying  Eye Contact Fair  Facial Expression Worried  Affect Preoccupied  Speech Logical/coherent  Interaction Assertive  Motor Activity Slow  Appearance/Hygiene Unremarkable  Behavior Characteristics Cooperative;Appropriate to situation  Mood Threatening  Aggressive Behavior  Effect No apparent injury  Thought Process  Coherency Circumstantial  Content Blaming others  Delusions None reported or observed  Perception WDL  Hallucination None reported or observed  Judgment WDL  Confusion None  Danger to Self  Current suicidal ideation? Denies  Danger to Others  Danger to Others None reported or observed

## 2022-07-20 NOTE — Psychosocial Assessment (Signed)
[facility] Notes:  (Nursing/MHT/Case Management/Adjunct)  Date:  07/20/2022  Time:  11:32 PM  Type of Therapy:   wrap up group  Participation Level:  Minimal  Participation Quality:  Appropriate  Affect:  Blunted  Cognitive:  Appropriate  Insight:  Good  Engagement in Group:  Engaged  Modes of Intervention:  Activity  Summary of Progress/Problems:  Shelley Graham 07/20/2022, 11:32 PM

## 2022-07-20 NOTE — ED Notes (Signed)
Pt refused snack at this time
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
## 2022-07-20 NOTE — ED Notes (Addendum)
Patient transferred via w/c to go to [facility]U, in police custody, all belongings taken with her, no signs of distress.Called report to [facility] RN .
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
## 2022-07-20 NOTE — ED Notes (Signed)
TTS at bedside assessing patient.

## 2022-07-20 NOTE — ED Notes (Signed)
IVC/pending psych consult 

## 2022-07-20 NOTE — ED Notes (Signed)
The NP Roselyn Reef) and TTS went and talked with Patient, Patient remained calm and cooperative, no behavioral issues noted.

## 2022-07-21 DIAGNOSIS — F332 Major depressive disorder, recurrent severe without psychotic features: Secondary | ICD-10-CM

## 2022-07-21 MED ORDER — GABAPENTIN 300 MG PO CAPS
600.0000 mg | ORAL_CAPSULE | Freq: Three times a day (TID) | ORAL | Status: DC
  Administered 2022-07-21 – 2022-07-22 (×3): 600 mg via ORAL
  Filled 2022-07-21 (×4): qty 2

## 2022-07-21 NOTE — Plan of Care (Signed)
Data: Patient is appropriate and cooperative to assessment. Denies Patient SI/HI/AVH. Patient has completed daily self inventory worksheet. Patient reports anxiety.  Action:  Q x 15 minute observation checks were completed for safety. Patient was provided with education on medications. Patient was offered support and encouragement. Patient was given scheduled medications. Patient  was encourage to attend groups, participate in unit activities and continue with plan of care.    Response: Patient is adherent with medications and groups. Patient has no complaints at this time. Patient is receptive to treatment and safety maintained on unit.   Problem: Education: Goal: Knowledge of General Education information will improve Description: Including pain rating scale, medication(s)/side effects and non-pharmacologic comfort measures Outcome: Progressing   Problem: Health Behavior/Discharge Planning: Goal: Ability to manage health-related needs will improve Outcome: Progressing   Problem: Clinical Measurements: Goal: Ability to maintain clinical measurements within normal limits will improve Outcome: Progressing

## 2022-07-21 NOTE — BHH Group Notes (Signed)
[facility] Group Notes:  (Nursing/MHT/Case Management/Adjunct)  Date:  07/21/2022  Time:  11:32 PM  Type of Therapy:  Group Therapy  Participation Level:  Did Not Attend   Maglione,Kolby Schara E 07/21/2022, 11:32 PM
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
## 2022-07-21 NOTE — BHH Suicide Risk Assessment (Signed)
[facility] INPATIENT:  Family/Significant Other Suicide Prevention Education  Suicide Prevention Education:  Contact Attempts:  Drusilla Kanner, roommate, [phone] has been identified by the patient as the family member/significant other with whom the patient will be residing, and identified as the person(s) who will aid the patient in the event of a mental health crisis.  With written consent from the patient, two attempts were made to provide suicide prevention education, prior to and/or following the patient's discharge.  We were unsuccessful in providing suicide prevention education.  A suicide education pamphlet was given to the patient to share with family/significant other.  Date and time of first attempt: 07/21/2022 3:29 PM Date and time of second attempt: CSW team will make additional attempt to reach family/friends to complete SPE.   Durenda Hurt 07/21/2022, 3:27 PM

## 2022-07-21 NOTE — BHH Group Notes (Signed)
Orientation/Goals Group Pt states she is feeling fine. States her goal is to speak with MD about discharge and to speak with SW about collateral information that room mate/friend wrongly IVC'd her.

## 2022-07-21 NOTE — BHH Group Notes (Signed)
Psychoeducational Group- Patient attended Psychoeducational group on identifying negative behavioral patterns and recognizing symptoms of anxiety.  Poem by psychotherapist Donzetta Kohut '' The owl and the chimpanzee '' was read to help patients identify signs of anxiety, and reflection was used during group. Patient attended and was appropriate.
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
## 2022-07-21 NOTE — Group Note (Signed)
Date:  07/21/2022 Time:  3:30 PM  Group Topic/Focus:  Activity Group-[place]    Participation Level:  Active  Participation Quality:  Appropriate  Affect:  Appropriate  Cognitive:  Appropriate  Insight: Appropriate  Engagement in Group:  Engaged  Modes of Intervention:  Activity  Additional Comments:    Shelley Graham [name] 07/21/2022, 3:30 PM

## 2022-07-22 DIAGNOSIS — F332 Major depressive disorder, recurrent severe without psychotic features: Secondary | ICD-10-CM | POA: Diagnosis not present

## 2022-07-22 MED ORDER — NICOTINE 21 MG/24HR TD PT24: 21.0000 mg | MEDICATED_PATCH | Freq: Every day | TRANSDERMAL | 1 refills | Status: AC

## 2022-07-22 MED ORDER — GABAPENTIN 300 MG PO CAPS: 600.0000 mg | ORAL_CAPSULE | Freq: Three times a day (TID) | ORAL | 0 refills | Status: AC

## 2022-07-22 MED ORDER — NICOTINE 21 MG/24HR TD PT24: 21.0000 mg | MEDICATED_PATCH | Freq: Every day | TRANSDERMAL | 1 refills | Status: DC

## 2022-07-22 MED ORDER — GABAPENTIN 300 MG PO CAPS: 600.0000 mg | ORAL_CAPSULE | Freq: Three times a day (TID) | ORAL | 0 refills | Status: DC

## 2022-07-22 NOTE — Plan of Care (Signed)
Adequate for Discharge.  Problem: Education: Goal: Knowledge of General Education information will improve Description: Including pain rating scale, medication(s)/side effects and non-pharmacologic comfort measures Outcome: Completed/Met   Problem: Health Behavior/Discharge Planning: Goal: Ability to manage health-related needs will improve Outcome: Completed/Met   Problem: Clinical Measurements: Goal: Ability to maintain clinical measurements within normal limits will improve Outcome: Completed/Met Goal: Will remain free from infection Outcome: Completed/Met Goal: Diagnostic test results will improve Outcome: Completed/Met Goal: Respiratory complications will improve Outcome: Completed/Met Goal: Cardiovascular complication will be avoided Outcome: Completed/Met   Problem: Activity: Goal: Risk for activity intolerance will decrease Outcome: Completed/Met   Problem: Nutrition: Goal: Adequate nutrition will be maintained Outcome: Completed/Met   Problem: Coping: Goal: Level of anxiety will decrease Outcome: Completed/Met   Problem: Elimination: Goal: Will not experience complications related to bowel motility Outcome: Completed/Met Goal: Will not experience complications related to urinary retention Outcome: Completed/Met   Problem: Pain Managment: Goal: General experience of comfort will improve Outcome: Completed/Met   Problem: Safety: Goal: Ability to remain free from injury will improve Outcome: Completed/Met   Problem: Skin Integrity: Goal: Risk for impaired skin integrity will decrease Outcome: Completed/Met   Problem: Education: Goal: Knowledge of [place] [facility] Education information/materials will improve Outcome: Completed/Met Goal: Emotional status will improve Outcome: Completed/Met Goal: Mental status will improve Outcome: Completed/Met Goal: Verbalization of understanding the information provided will improve Outcome: Completed/Met    Problem: Safety: Goal: Periods of time without injury will increase Outcome: Completed/Met   Problem: Self-Concept: Goal: Ability to disclose and discuss suicidal ideas will improve Outcome: Completed/Met Goal: Will verbalize positive feelings about self Outcome: Completed/Met   Problem: Self-Concept: Goal: Level of anxiety will decrease Outcome: Completed/Met Goal: Ability to modify response to factors that promote anxiety will improve Outcome: Completed/Met

## 2022-07-22 NOTE — Progress Notes (Signed)
  [facility] Adult Case Management Discharge Plan :  Will you be returning to the same living situation after discharge:  Yes,  pt plans to return home upon discharge. At discharge, do you have transportation home?: Yes,  pt support system to provide transportation home.  Do you have the ability to pay for your medications: No.  Release of information consent forms completed and in the chart;  Patient's signature needed at discharge.  Patient to Follow up at:  Follow-up Information     [facility] Follow up.   Why: They have walk-in hours, Mondays, Wednesdays, and Friday, 8am-4pm. They recommend that you show up prior to 7:30am on the day you plan to walk-in to be seen the same day, as clients are seen on a first come, first served basis. Thanks! Contact information: [place] [address] [phone]                 Next level of care provider has access to [place] and Suicide Prevention discussed: Yes,  SPE completed with pt.      Has patient been referred to the Quitline?: Patient refused referral  Patient has been referred for addiction treatment: N/A  Shirl Harris, LCSW 07/22/2022, 10:57 AM

## 2022-07-22 NOTE — Group Note (Signed)
Date:  07/22/2022 Time:  9:37 AM  Group Topic/Focus:  Goals Group:   The focus of this group is to help patients establish daily goals to achieve during treatment and discuss how the patient can incorporate goal setting into their daily lives to aide in recovery.  Community Meeting   Participation Level:  Active  Participation Quality:  Appropriate  Affect:  Appropriate  Cognitive:  Appropriate  Insight: Appropriate  Engagement in Group:  Engaged  Modes of Intervention:  Discussion, Education, and Support  Additional Comments:    Dorena Bodo 07/22/2022, 9:37 AM

## 2022-07-22 NOTE — Progress Notes (Signed)
Patient denies SI, HI & AVH. She was cooperative with treatment on the shift and she was pleasant on approach.

## 2022-07-22 NOTE — BHH Counselor (Signed)
CSW met with pt briefly to discussion discharge and aftercare plans. She endorsed plans to return home upon discharge and shared that she has transportation to get there. She endorsed that she would be willing to go to [facility] during walk-in hours. CSW provided education around the walk-in hours for [facility]. She stated that this should work as she is off on Monday. Pt has clothes to wear home. She endorsed vaping and expressed interest in cessation but when asked about Quitline referral, she declined. Pt denied any use of substances or need for such services. No other concerns expressed. Contact ended without incident.   Chalmers Guest. Guerry Bruin, MSW, LCSW, [place] 07/22/2022 10:57 AM

## 2022-07-22 NOTE — Progress Notes (Signed)
Patient ID: Shelley Graham, female   DOB: 07-25-1995, 27 y.o.   MRN: [id number]  Discharge Note:  Patient denies SI/HI/AVH at this time. Discharge instructions, AVS, prescriptions, and transition record gone over with patient. Patient received a copy of her Suicide Safety Plan. Patient agrees to comply with medication management, follow-up visit, and outpatient therapy. Patient belongings returned to patient. Patient questions and concerns addressed and answered. Patient ambulatory off unit. Patient discharged to home with her room mate.

## 2022-07-22 NOTE — Group Note (Signed)
Recreation Therapy Group Note   Group Topic:Emotion Expression  Group Date: 07/22/2022 Start Time: 1000 End Time: 1045 Facilitators: Vilma Prader, LRT, CTRS Location:  [facility]  Group Description: Gratitude Journaling. Patients and LRT discussed what gratitude means, how we can express it and what it means to [place], personally. LRT gave an education handout on the definition of gratitude that also gave different examples of gratitude exercises that they could try. One of the examples was "Gratitude Letter", which prompted you to write a letter to someone you appreciate. LRT played soft music while everyone wrote their letter. Once letter was completed, LRT encouraged people to read their letter, if they wanted to, or share who they wrote it to, at minimum. LRT and pts processed how showing gratitude towards themselves, and others can be applied to everyday life post-discharge.   Affect/Mood: N/A   Participation Level: Did not attend    Clinical Observations/Individualized Feedback: Ludmila did not attend group due to meeting with MD.  Plan: Continue to engage patient in RT group sessions 2-3x/week.   Vilma Prader, LRT, CTRS 07/22/2022 11:12 AM

## 2022-07-22 NOTE — BHH Suicide Risk Assessment (Signed)
[facility] Discharge Suicide Risk Assessment   Principal Problem: Severe episode of recurrent major depressive disorder, without psychotic features ([place]) Discharge Diagnoses: Principal Problem:   Severe episode of recurrent major depressive disorder, without psychotic features ([place])   Total Time spent with patient: 45 minutes  Musculoskeletal: Strength & Muscle Tone: within normal limits Gait & Station: normal Patient leans: N/A  Psychiatric Specialty Exam  Presentation  General Appearance: No data recorded Eye Contact:No data recorded Speech:No data recorded Speech Volume:No data recorded Handedness:No data recorded  Mood and Affect  Mood:No data recorded Duration of Depression Symptoms: No data recorded Affect:No data recorded  Thought Process  Thought Processes:No data recorded Descriptions of Associations:No data recorded Orientation:No data recorded Thought Content:No data recorded History of Schizophrenia/Schizoaffective disorder:No data recorded Duration of Psychotic Symptoms:No data recorded Hallucinations:No data recorded Ideas of Reference:No data recorded Suicidal Thoughts:No data recorded Homicidal Thoughts:No data recorded  Sensorium  Memory:No data recorded Judgment:No data recorded Insight:No data recorded  Executive Functions  Concentration:No data recorded Attention Span:No data recorded Recall:No data recorded Fund of Knowledge:No data recorded Language:No data recorded  Psychomotor Activity  Psychomotor Activity:No data recorded  Assets  Assets:No data recorded  Sleep  Sleep:No data recorded  Physical Exam: Physical Exam Vitals reviewed.  Constitutional:      Appearance: Normal appearance.  HENT:     Head: Normocephalic and atraumatic.     Mouth/Throat:     Pharynx: Oropharynx is clear.  Eyes:     Pupils: Pupils are equal, round, and reactive to light.  Cardiovascular:     Rate and Rhythm: Normal rate and regular rhythm.   Pulmonary:     Effort: Pulmonary effort is normal.     Breath sounds: Normal breath sounds.  Abdominal:     General: Abdomen is flat.     Palpations: Abdomen is soft.  Musculoskeletal:        General: Normal range of motion.  Skin:    General: Skin is warm and dry.  Neurological:     General: No focal deficit present.     Mental Status: She is alert. Mental status is at baseline.  Psychiatric:        Mood and Affect: Mood normal.        Thought Content: Thought content normal.    Review of Systems  Constitutional: Negative.   HENT: Negative.    Eyes: Negative.   Respiratory: Negative.    Cardiovascular: Negative.   Gastrointestinal: Negative.   Musculoskeletal: Negative.   Skin: Negative.   Neurological: Negative.   Psychiatric/Behavioral: Negative.     Blood pressure 124/72, pulse 67, temperature 98 F (36.7 C), temperature source Oral, resp. rate 19, height 5\' 10"  (1.778 m), weight 106.1 kg, last menstrual period 06/27/2022, SpO2 98 %. Body mass index is 33.58 kg/m.  Mental Status Per Nursing Assessment::   On Admission:  NA  Demographic Factors:  NA  Loss Factors: NA  Historical Factors: Impulsivity  Risk Reduction Factors:   Living with another person, especially a relative, Positive social support, and Positive coping skills or problem solving skills  Continued Clinical Symptoms:  Severe Anxiety and/or Agitation  Cognitive Features That Contribute To Risk:  None    Suicide Risk:  Minimal: No identifiable suicidal ideation.  Patients presenting with no risk factors but with morbid ruminations; may be classified as minimal risk based on the severity of the depressive symptoms    Plan Of Care/Follow-up recommendations:  Other:  Patient has consistently denied any suicidal ideation.  On interview today she is calm and euthymic with appropriate affect and clear lucid thinking.  She denies any suicidal or homicidal thought at all.  Denies any current  symptoms of depression or bipolar disorder.  Denies substance abuse.  No evidence currently to support involuntary commitment.  Patient advised to consider whether she needs outpatient treatment and will be given follow-up referral to [facility] if needed.  Alethia Berthold, MD 07/22/2022, 10:18 AM

## 2022-07-22 NOTE — Discharge Summary (Signed)
Physician Discharge Summary Note  Patient:  Shelley Graham is an 27 y.o., female MRN:  [id number] DOB:  09/05/1995 Patient phone:  There is no home phone number on file.  Patient address:   [place] [address],  Total Time spent with patient: 30 minutes  Date of Admission:  07/20/2022 Date of Discharge: 07/22/2022  Reason for Admission: Patient was admitted because involuntary commitment papers were filed by her roommate alleging that the patient had made suicidal threats  Principal Problem: Severe episode of recurrent major depressive disorder, without psychotic features [facility]) Discharge Diagnoses: Principal Problem:   Severe episode of recurrent major depressive disorder, without psychotic features ([place])   Past Psychiatric History: Patient has a past history of substance use problems and mood instability and has had at least 1 prior hospitalization several years ago.  Past Medical History:  Past Medical History:  Diagnosis Date   Anxiety    Depression    History of migraine headaches    Hx of chlamydia infection    Hypertension    Pregnancy induced hypertension    UTI (lower urinary tract infection)    RESOLVED    Past Surgical History:  Procedure Laterality Date   CHOLECYSTECTOMY N/A 12/20/2015   Procedure: LAPAROSCOPIC CHOLECYSTECTOMY;  Surgeon: Jules Husbands, MD;  Location: [facility] ORS;  Service: General;  Laterality: N/A;   OTHER SURGICAL HISTORY     tubes ears 18 months   PERINEAL LACERATION REPAIR N/A 11/03/2015   Procedure: SUTURE REPAIR PERINEAL LACERATION;  Surgeon: Rubie Maid, MD;  Location: [facility] ORS;  Service: Gynecology;  Laterality: N/A;   tubes in ears Bilateral    Family History:  Family History  Problem Relation Age of Onset   Asthma Mother        as child   Migraines Mother    Thyroid disease Mother    Cancer Father    COPD Father    Cancer Maternal Grandmother        colon   Cancer Paternal Grandfather        lung   Asthma Brother     Family Psychiatric  History: See previous Social History:  Social History   Substance and Sexual Activity  Alcohol Use Yes   Alcohol/week: 0.0 standard drinks of alcohol     Social History   Substance and Sexual Activity  Drug Use Yes   Types: Benzodiazepines, Marijuana, Cocaine   Comment: uses with anxiety for 6 years    Social History   Socioeconomic History   Marital status: Single    Spouse name: Not on file   Number of children: Not on file   Years of education: Not on file   Highest education level: Not on file  Occupational History   Not on file  Tobacco Use   Smoking status: Every Day    Packs/day: 1.00    Years: 6.00    Additional pack years: 0.00    Total pack years: 6.00    Types: Cigarettes    Last attempt to quit: 03/15/2015    Years since quitting: 7.3   Smokeless tobacco: Never  Substance and Sexual Activity   Alcohol use: Yes    Alcohol/week: 0.0 standard drinks of alcohol   Drug use: Yes    Types: Benzodiazepines, Marijuana, Cocaine    Comment: uses with anxiety for 6 years   Sexual activity: Never    Partners: Male    Birth control/protection: None  Other Topics Concern   Not  on file  Social History Narrative   Not on file   Social Determinants of Health   Financial Resource Strain: Not on file  Food Insecurity: No Food Insecurity (07/20/2022)   Hunger Vital Sign    Worried About Running Out of Food in the Last Year: Never true    Ran Out of Food in the Last Year: Never true  Transportation Needs: No Transportation Needs (07/20/2022)   PRAPARE - Hydrologist (Medical): No    Lack of Transportation (Non-Medical): No  Physical Activity: Not on file  Stress: Not on file  Social Connections: Not on file    Hospital Course: Patient has denied suicidal ideation consistently since coming into the hospital.  She has not displayed any dangerous behavior.  She has been calm and lucid with no evidence of thought  disorder or manic behavior.  On interview today the patient denies suicidal ideation and denies psychotic symptoms.  States that she had not been feeling that she needed mental health follow-up but is agreeable to receiving information about [facility].  She is taking gabapentin as an outpatient for chronic pain and anxiety problems but otherwise on no psychiatric medicine.  No medicines indicated at this point.  Patient will be discharged home and encouraged to consider following up with [facility] if symptoms persist.  Physical Findings: AIMS:  , ,  ,  ,    CIWA:    COWS:     Musculoskeletal: Strength & Muscle Tone: within normal limits Gait & Station: normal Patient leans: N/A   Psychiatric Specialty Exam:  Presentation  General Appearance: No data recorded Eye Contact:No data recorded Speech:No data recorded Speech Volume:No data recorded Handedness:No data recorded  Mood and Affect  Mood:No data recorded Affect:No data recorded  Thought Process  Thought Processes:No data recorded Descriptions of Associations:No data recorded Orientation:No data recorded Thought Content:No data recorded History of Schizophrenia/Schizoaffective disorder:No data recorded Duration of Psychotic Symptoms:No data recorded Hallucinations:No data recorded Ideas of Reference:No data recorded Suicidal Thoughts:No data recorded Homicidal Thoughts:No data recorded  Sensorium  Memory:No data recorded Judgment:No data recorded Insight:No data recorded  Executive Functions  Concentration:No data recorded Attention Span:No data recorded Recall:No data recorded Fund of Knowledge:No data recorded Language:No data recorded  Psychomotor Activity  Psychomotor Activity:No data recorded  Assets  Assets:No data recorded  Sleep  Sleep:No data recorded   Physical Exam: Physical Exam Vitals and nursing note reviewed.  Constitutional:      Appearance: Normal appearance.  HENT:     Head: Normocephalic and  atraumatic.     Mouth/Throat:     Pharynx: Oropharynx is clear.  Eyes:     Pupils: Pupils are equal, round, and reactive to light.  Cardiovascular:     Rate and Rhythm: Normal rate and regular rhythm.  Pulmonary:     Effort: Pulmonary effort is normal.     Breath sounds: Normal breath sounds.  Abdominal:     General: Abdomen is flat.     Palpations: Abdomen is soft.  Musculoskeletal:        General: Normal range of motion.  Skin:    General: Skin is warm and dry.  Neurological:     General: No focal deficit present.     Mental Status: She is alert. Mental status is at baseline.  Psychiatric:        Mood and Affect: Mood normal.        Thought Content: Thought content normal.    Review  of Systems  Constitutional: Negative.   HENT: Negative.    Eyes: Negative.   Respiratory: Negative.    Cardiovascular: Negative.   Gastrointestinal: Negative.   Musculoskeletal: Negative.   Skin: Negative.   Neurological: Negative.   Psychiatric/Behavioral: Negative.     Blood pressure 124/72, pulse 67, temperature 98 F (36.7 C), temperature source Oral, resp. rate 19, height 5\' 10"  (1.778 m), weight 106.1 kg, last menstrual period 06/27/2022, SpO2 98 %. Body mass index is 33.58 kg/m.   Social History   Tobacco Use  Smoking Status Every Day   Packs/day: 1.00   Years: 6.00   Additional pack years: 0.00   Total pack years: 6.00   Types: Cigarettes   Last attempt to quit: 03/15/2015   Years since quitting: 7.3  Smokeless Tobacco Never   Tobacco Cessation:  A prescription for an FDA-approved tobacco cessation medication provided at discharge   Blood Alcohol level:  Lab Results  Component Value Date   [facility] <10 07/19/2022   ETH <5 [id number]    Metabolic Disorder Labs:  Lab Results  Component Value Date   HGBA1C 5.2 09/10/2016   MPG 103 09/10/2016   Lab Results  Component Value Date   PROLACTIN 10.7 09/10/2016   Lab Results  Component Value Date   CHOL 186  09/10/2016   TRIG 146 09/10/2016   HDL 37 (L) 09/10/2016   CHOLHDL 5.0 09/10/2016   VLDL 29 09/10/2016   LDLCALC 120 (H) 09/10/2016    See Psychiatric Specialty Exam and Suicide Risk Assessment completed by Attending Physician prior to discharge.  Discharge destination:  Home  Is patient on multiple antipsychotic therapies at discharge:  No   Has Patient had three or more failed trials of antipsychotic monotherapy by history:  No  Recommended Plan for Multiple Antipsychotic Therapies: NA  Discharge Instructions     Diet - low sodium heart healthy   Complete by: As directed    Increase activity slowly   Complete by: As directed       Allergies as of 07/22/2022       Reactions   Ceftriaxone Sodium In Dextrose Rash   Reaction occurred immediately after starting IV Ceftriaxone and medication was stopped directly after symptoms began.    Ciprofloxacin Hives, Rash        Medication List     STOP taking these medications    FLUoxetine 20 MG capsule Commonly known as: PROZAC   traZODone 150 MG tablet Commonly known as: DESYREL       TAKE these medications      Indication  gabapentin 300 MG capsule Commonly known as: NEURONTIN Take 2 capsules (600 mg total) by mouth 3 (three) times daily. What changed:  medication strength how much to take Another medication with the same name was removed. Continue taking this medication, and follow the directions you see here.  Indication: Fibromyalgia Syndrome, Generalized Anxiety Disorder   nicotine 21 mg/24hr patch Commonly known as: NICODERM CQ - dosed in mg/24 hours Place 1 patch (21 mg total) onto the skin daily. Start taking on: July 23, 2022  Indication: Nicotine Addiction         Follow-up recommendations:  Other:  Patient will be given information regarding [facility] and encouraged to consider follow-up there if she believes she needs further mental health evaluation  Comments: See above  Signed: Alethia Berthold, MD 07/22/2022, 10:38 AM

## 2022-07-22 NOTE — BH IP Treatment Plan (Signed)
Interdisciplinary Treatment and Diagnostic Plan Update  07/22/2022 Time of Session: 08:54 Shelley Graham MRN: [id number]  Principal Diagnosis: Severe episode of recurrent major depressive disorder, without psychotic features ([place])  Secondary Diagnoses: Principal Problem:   Severe episode of recurrent major depressive disorder, without psychotic features ([place])   Current Medications:  Current Facility-Administered Medications  Medication Dose Route Frequency Provider Last Rate Last Admin   acetaminophen (TYLENOL) tablet 650 mg  650 mg Oral Q6H PRN Patrecia Pour, NP       alum & mag hydroxide-simeth (MAALOX/MYLANTA) 200-200-20 MG/5ML suspension 30 mL  30 mL Oral Q4H PRN Patrecia Pour, NP       OLANZapine (ZYPREXA) injection 10 mg  10 mg Intramuscular Once PRN Patrecia Pour, NP       And   diphenhydrAMINE (BENADRYL) injection 50 mg  50 mg Intravenous Once PRN Patrecia Pour, NP       gabapentin (NEURONTIN) capsule 600 mg  600 mg Oral TID Parks Ranger, DO   600 mg at 07/22/22 0815   magnesium hydroxide (MILK OF MAGNESIA) suspension 30 mL  30 mL Oral Daily PRN Patrecia Pour, NP       nicotine (NICODERM CQ - dosed in mg/24 hours) patch 21 mg  21 mg Transdermal Daily Parks Ranger, DO   21 mg at 07/22/22 0816   traZODone (DESYREL) tablet 150 mg  150 mg Oral QHS PRN Patrecia Pour, NP   150 mg at 07/21/22 2152   PTA Medications: Medications Prior to Admission  Medication Sig Dispense Refill Last Dose   gabapentin (NEURONTIN) 600 MG tablet Take 600 mg by mouth 3 (three) times daily.   Past Month   FLUoxetine (PROZAC) 20 MG capsule Take 1 capsule (20 mg total) by mouth daily. (Patient not taking: Reported on 07/19/2022) 30 capsule 0    gabapentin (NEURONTIN) 400 MG capsule Take 400 mg by mouth 3 (three) times daily. (Patient not taking: Reported on 07/19/2022)      traZODone (DESYREL) 150 MG tablet Take 1 tablet (150 mg total) by mouth daily with supper. (Patient not  taking: Reported on 07/19/2022) 30 tablet 0     Patient Stressors: Legal issue   Loss of child custody two weeks ago   Medication change or noncompliance    Patient Strengths: Ability for insight  Engineer, drilling fund of knowledge  Motivation for treatment/growth  Work skills   Treatment Modalities: Medication Management, Group therapy, Case management,  1 to 1 session with clinician, Psychoeducation, Recreational therapy.   Physician Treatment Plan for Primary Diagnosis: Severe episode of recurrent major depressive disorder, without psychotic features ([place]) Long Term Goal(s): Improvement in symptoms so as ready for discharge   Short Term Goals: Ability to identify changes in lifestyle to reduce recurrence of condition will improve Ability to verbalize feelings will improve Ability to disclose and discuss suicidal ideas Ability to demonstrate self-control will improve Ability to identify and develop effective coping behaviors will improve Ability to maintain clinical measurements within normal limits will improve Compliance with prescribed medications will improve Ability to identify triggers associated with substance abuse/mental health issues will improve  Medication Management: Evaluate patient's response, side effects, and tolerance of medication regimen.  Therapeutic Interventions: 1 to 1 sessions, Unit Group sessions and Medication administration.  Evaluation of Outcomes: Progressing  Physician Treatment Plan for Secondary Diagnosis: Principal Problem:   Severe episode of recurrent major depressive disorder, without psychotic features ([place])  Long Term Goal(s): Improvement in symptoms so as ready for discharge   Short Term Goals: Ability to identify changes in lifestyle to reduce recurrence of condition will improve Ability to verbalize feelings will improve Ability to disclose and discuss suicidal ideas Ability to demonstrate  self-control will improve Ability to identify and develop effective coping behaviors will improve Ability to maintain clinical measurements within normal limits will improve Compliance with prescribed medications will improve Ability to identify triggers associated with substance abuse/mental health issues will improve     Medication Management: Evaluate patient's response, side effects, and tolerance of medication regimen.  Therapeutic Interventions: 1 to 1 sessions, Unit Group sessions and Medication administration.  Evaluation of Outcomes: Progressing   RN Treatment Plan for Primary Diagnosis: Severe episode of recurrent major depressive disorder, without psychotic features ([place]) Long Term Goal(s): Knowledge of disease and therapeutic regimen to maintain health will improve  Short Term Goals: Ability to remain free from injury will improve, Ability to verbalize frustration and anger appropriately will improve, Ability to demonstrate self-control, Ability to participate in decision making will improve, Ability to verbalize feelings will improve, Ability to disclose and discuss suicidal ideas, Ability to identify and develop effective coping behaviors will improve, and Compliance with prescribed medications will improve  Medication Management: RN will administer medications as ordered by provider, will assess and evaluate patient's response and provide education to patient for prescribed medication. RN will report any adverse and/or side effects to prescribing provider.  Therapeutic Interventions: 1 on 1 counseling sessions, Psychoeducation, Medication administration, Evaluate responses to treatment, Monitor vital signs and CBGs as ordered, Perform/monitor CIWA, COWS, AIMS and Fall Risk screenings as ordered, Perform wound care treatments as ordered.  Evaluation of Outcomes: Progressing   LCSW Treatment Plan for Primary Diagnosis: Severe episode of recurrent major depressive disorder,  without psychotic features ([place]) Long Term Goal(s): Safe transition to appropriate next level of care at discharge, Engage patient in therapeutic group addressing interpersonal concerns.  Short Term Goals: Engage patient in aftercare planning with referrals and resources, Increase social support, Increase ability to appropriately verbalize feelings, Increase emotional regulation, Facilitate acceptance of mental health diagnosis and concerns, and Increase skills for wellness and recovery  Therapeutic Interventions: Assess for all discharge needs, 1 to 1 time with Social worker, Explore available resources and support systems, Assess for adequacy in community support network, Educate family and significant other(s) on suicide prevention, Complete Psychosocial Assessment, Interpersonal group therapy.  Evaluation of Outcomes: Progressing   Progress in Treatment: Attending groups: Yes. Participating in groups: Yes. Taking medication as prescribed: Yes. Toleration medication: Yes. Family/Significant other contact made: No, will contact:  roommate, L. Lennox Grumbles. Patient understands diagnosis: Yes. Discussing patient identified problems/goals with staff: Yes. Medical problems stabilized or resolved: Yes. Denies suicidal/homicidal ideation: Yes. Issues/concerns per patient self-inventory: No. Other: none.  New problem(s) identified: No, Describe:  none identified  New Short Term/Long Term Goal(s): medication management for mood stabilization; elimination of SI thoughts; development of comprehensive mental wellness plan.  Patient Goals:  "To be honest, my goal is to leave and go to work."  Discharge Plan or Barriers: CSW will assist pt with development of an appropriate aftercare/discharge plan.   Reason for Continuation of Hospitalization: Anxiety Medication stabilization  Estimated Length of Stay: 1-7 days  Last 3 [place] Suicide Severity Risk Score: Flowsheet Row Admission (Current) from  07/20/2022 in [place] Most recent reading at 07/20/2022  6:00 PM ED from 07/20/2022 in [facility] Most recent reading at 07/19/2022  5:59 PM  C-SSRS RISK CATEGORY No Risk No Risk       Last PHQ 2/9 Scores:     No data to display          Scribe for Treatment Team: Shirl Harris, LCSW 07/22/2022 9:09 AM

## 2022-08-03 ENCOUNTER — Emergency Department
Admission: EM | Admit: 2022-08-03 | Discharge: 2022-08-03 | Disposition: A | Discharge status: Home / Self Care | Payer: Self-pay | Attending: Emergency Medicine | Admitting: Emergency Medicine

## 2022-08-03 ENCOUNTER — Other Ambulatory Visit: Payer: Self-pay

## 2022-08-03 DIAGNOSIS — A599 Trichomoniasis, unspecified: Secondary | ICD-10-CM

## 2022-08-03 DIAGNOSIS — B9689 Other specified bacterial agents as the cause of diseases classified elsewhere: Secondary | ICD-10-CM | POA: Insufficient documentation

## 2022-08-03 DIAGNOSIS — A5901 Trichomonal vulvovaginitis: Secondary | ICD-10-CM | POA: Insufficient documentation

## 2022-08-03 DIAGNOSIS — N760 Acute vaginitis: Secondary | ICD-10-CM | POA: Insufficient documentation

## 2022-08-03 DIAGNOSIS — F17200 Nicotine dependence, unspecified, uncomplicated: Secondary | ICD-10-CM | POA: Insufficient documentation

## 2022-08-03 LAB — URINALYSIS, ROUTINE W REFLEX MICROSCOPIC
Bacteria, UA: NONE SEEN
Bilirubin Urine: NEGATIVE
Glucose, UA: NEGATIVE mg/dL
Ketones, ur: 5 mg/dL — AB
Nitrite: NEGATIVE
Protein, ur: 100 mg/dL — AB
Specific Gravity, Urine: 1.028 (ref 1.005–1.030)
WBC, UA: >50 WBC/hpf (ref 0–5)
pH: 5 (ref 5.0–8.0)

## 2022-08-03 LAB — WET PREP, GENITAL
Sperm: NONE SEEN
WBC, Wet Prep HPF POC: >=10 — AB (ref <10)
Yeast Wet Prep HPF POC: NONE SEEN

## 2022-08-03 LAB — CHLAMYDIA/NGC RT PCR (ARMC ONLY)
Chlamydia Tr: NOT DETECTED
N gonorrhoeae: NOT DETECTED

## 2022-08-03 LAB — HIV ANTIBODY (ROUTINE TESTING W REFLEX): HIV Screen 4th Generation wRfx: NONREACTIVE

## 2022-08-03 LAB — POC URINE PREG, ED: Preg Test, Ur: NEGATIVE

## 2022-08-03 MED ORDER — VALACYCLOVIR HCL 1 G PO TABS: 1000.0000 mg | ORAL_TABLET | Freq: Two times a day (BID) | ORAL | 0 refills | Status: AC

## 2022-08-03 MED ORDER — METRONIDAZOLE 500 MG PO TABS: 500.0000 mg | ORAL_TABLET | Freq: Two times a day (BID) | ORAL | 0 refills | Status: AC

## 2022-08-03 NOTE — Discharge Instructions (Signed)
Please take the Flagyl for your bacterial vaginosis and your trichomonas infection.  Do not drink alcohol with this medication.  Your gonorrhea and chlamydia swabs were negative.  Your HIV, syphilis, and HSV swabs are still pending, though as we discussed we will treat you empirically for the herpes, therefore please take the Valtrex as prescribed.  You can find the results of your HIV, syphilis, and HSV on MyChart as we discussed. Remember that there are many other STDs that and you should follow up with a primary care doctor to have the full panel of testing performed. Please do not have sexual intercourse until fully and properly tested and treated. Your partner also needs to be tested and treated. Please return for any new, worsening, or change in symptoms or other concerns.  Was a pleasure caring for you today.

## 2022-08-03 NOTE — ED Provider Notes (Signed)
[facility] Provider Note    Event Date/Time   First MD Initiated Contact with Patient 08/03/22 1245     (approximate)   History   SEXUALLY TRANSMITTED DISEASE   HPI  Shelley Graham is a 27 y.o. female who presents today for evaluation of vaginal discharge.  Patient reports that she most recently had intercourse 2 weeks ago.  She reports that she has 2 partners, and has had unprotected intercourse.  She reports that over the past 3 days she has noticed yellow vaginal discharge.  She also has noticed a "bump" externally and is worried that this may be herpes.  She denies any previous history of herpes.  She reports that she has had swollen lymph nodes in her neck and a stuffy nose and she is worried that this is a first-time herpes infection.  She denies having any oral intercourse.  She denies abdominal pain or pelvic pain.  She reports that she has burning with urination.  No fevers or chills.  Patient Active Problem List   Diagnosis Date Noted   Severe episode of recurrent major depressive disorder, without psychotic features 07/20/2022   Cannabis use disorder, severe, dependence 09/10/2016   Cocaine use disorder, severe, dependence 09/10/2016   Tobacco use disorder 09/10/2016   Alcohol use disorder, moderate, dependence 09/10/2016   Severe recurrent major depression without psychotic features 09/09/2016          Physical Exam   Triage Vital Signs: ED Triage Vitals  Enc Vitals Group     BP 08/03/22 1236 (!) 153/104     Pulse Rate 08/03/22 1232 (!) 106     Resp 08/03/22 1232 18     Temp 08/03/22 1232 98.2 F (36.8 C)     Temp Source 08/03/22 1232 Oral     SpO2 08/03/22 1232 96 %     Weight --      Height --      Head Circumference --      Peak Flow --      Pain Score 08/03/22 1236 5     Pain Loc --      Pain Edu? --      Excl. in GC? --     Most recent vital signs: Vitals:   08/03/22 1232 08/03/22 1236  BP:  (!) 153/104  Pulse: (!)  106   Resp: 18   Temp: 98.2 F (36.8 C)   SpO2: 96%     Physical Exam Vitals and nursing note reviewed.  Constitutional:      General: Awake and alert. No acute distress.    Appearance: Normal appearance. The patient is overweight.  HENT:     Head: Normocephalic and atraumatic.     Mouth: Mucous membranes are moist.  Eyes:     General: PERRL. Normal EOMs        Right eye: No discharge.        Left eye: No discharge.     Conjunctiva/sclera: Conjunctivae normal.  Cardiovascular:     Rate and Rhythm: Normal rate and regular rhythm.     Pulses: Normal pulses.  Pulmonary:     Effort: Pulmonary effort is normal. No respiratory distress.     Breath sounds: Normal breath sounds.  Abdominal:     Abdomen is soft. There is no abdominal tenderness. No rebound or guarding. No distention. Pelvic: Yellow vaginal discharge.  No cervical motion tenderness.  No adnexal fullness or tenderness.  Right side of buttock, close to  labia with 3 x 3 mm shallow ulceration, tender to touch.  No other skin lesions noted. Musculoskeletal:        General: No swelling. Normal range of motion.     Cervical back: Normal range of motion and neck supple.  Skin:    General: Skin is warm and dry.     Capillary Refill: Capillary refill takes less than 2 seconds.     Findings: No rash.  Neurological:     Mental Status: The patient is awake and alert.      ED Results / Procedures / Treatments   Labs (all labs ordered are listed, but only abnormal results are displayed) Labs Reviewed  WET PREP, GENITAL - Abnormal; Notable for the following components:      Result Value   Trich, Wet Prep PRESENT (*)    Clue Cells Wet Prep HPF POC PRESENT (*)    WBC, Wet Prep HPF POC >=10 (*)    All other components within normal limits  URINALYSIS, ROUTINE W REFLEX MICROSCOPIC - Abnormal; Notable for the following components:   Color, Urine AMBER (*)    APPearance CLOUDY (*)    Hgb urine dipstick SMALL (*)    Ketones,  ur 5 (*)    Protein, ur 100 (*)    Leukocytes,Ua LARGE (*)    All other components within normal limits  CHLAMYDIA/NGC RT PCR ([facility] ONLY)            HSV CULTURE AND TYPING  RPR  HIV ANTIBODY (ROUTINE TESTING W REFLEX)  POC URINE PREG, ED     EKG     RADIOLOGY     PROCEDURES:  Critical Care performed:   Procedures   MEDICATIONS ORDERED IN ED: Medications - No data to display   IMPRESSION / MDM / ASSESSMENT AND PLAN / ED COURSE  I reviewed the triage vital signs and the nursing notes.   Differential diagnosis includes, but is not limited to, gonorrhea, chlamydia, HSV, bacterial vaginosis, trichomonas, other sexually transmitted disease, PID.  Patient is awake and alert, nontoxic in appearance though anxious.  Pelvic exam reveals yellow/white discharge which was cultured and sent to the lab.  No cervical motion tenderness or chandelier sign to suggest PID.  No adnexal fullness or tenderness, fever, pain out of proportion, or hemodynamic instability to suggest TOA.  Urinalysis is dirty, pregnancy is negative.  Wet prep is positive for clue cells and trichomonas.  She has a small ulceration, which was swabbed for HSV.  Discussed the risks and benefits of prophylactic treatment for HSV, which patient would like to proceed with.  Recent BMP obtained a couple of weeks ago demonstrates a normal kidney function, therefore no change in dose is necessary for Valtrex.  Gonorrhea and Chlamydia are negative.  HSV is a send out test, and lab reports that HIV and RPR will take a couple of days to return.  Discussed with patient how to find the results on MyChart.  She was started on metronidazole for her BV and trichomonas, and advised that she cannot drink alcohol with this medicine.  Patient was advised that there are many other STDs that patient could have, that we do not test for in the emergency department. Patient was advised to follow up with a primary care doctor to have the full  panel of testing performed. Patient was advised to not have sexual intercourse until fully and properly tested and treated for all STDs, including the ones that she is currently being  treated for. Patient was advised that his partner also needs to be tested and treated. Patient understands and agrees with plan.  She was discharged in stable condition.   Patient's presentation is most consistent with acute complicated illness / injury requiring diagnostic workup.     FINAL CLINICAL IMPRESSION(S) / ED DIAGNOSES   Final diagnoses:  Trichomonas vaginalis infection  BV (bacterial vaginosis)     Rx / DC Orders   ED Discharge Orders          Ordered    metroNIDAZOLE (FLAGYL) 500 MG tablet  2 times daily        08/03/22 1508    valACYclovir (VALTREX) 1000 MG tablet  2 times daily        08/03/22 1508             Note:  This document was prepared using Dragon voice recognition software and may include unintentional dictation errors.   Jackelyn Hoehn, PA-C 08/03/22 1533    Merwyn Katos, MD 08/03/22 [phone]

## 2022-08-03 NOTE — ED Triage Notes (Signed)
Pt c/o pelvic pain and yellow vaginal discharge with some odor x3 days. Pt states she would like to be checked for STD's.

## 2022-08-04 LAB — RPR: RPR Ser Ql: NONREACTIVE

## 2022-08-07 LAB — HSV CULTURE AND TYPING

## 2022-11-03 ENCOUNTER — Other Ambulatory Visit: Payer: Self-pay

## 2022-11-03 ENCOUNTER — Emergency Department
Admission: EM | Admit: 2022-11-03 | Discharge: 2022-11-03 | Disposition: A | Discharge status: Home / Self Care | Payer: Self-pay | Attending: Student in an Organized Health Care Education/Training Program | Admitting: Student in an Organized Health Care Education/Training Program

## 2022-11-03 DIAGNOSIS — F410 Panic disorder [episodic paroxysmal anxiety] without agoraphobia: Secondary | ICD-10-CM | POA: Insufficient documentation

## 2022-11-03 MED ORDER — DIAZEPAM 2 MG PO TABS
2.0000 mg | ORAL_TABLET | Freq: Once | ORAL | Status: AC
  Administered 2022-11-03: 2 mg via ORAL
  Filled 2022-11-03: qty 1

## 2022-11-03 MED ORDER — DIAZEPAM 2 MG PO TABS: 2.0000 mg | ORAL_TABLET | Freq: Three times a day (TID) | ORAL | 0 refills | Status: AC | PRN

## 2022-11-03 NOTE — ED Provider Notes (Signed)
[facility] Provider Note    Event Date/Time   First MD Initiated Contact with Patient 11/03/22 1617     (approximate)   History   Panic Attack and Dizziness   HPI  Shelley Graham is a 27 y.o. female history of anxiety not currently any medications presents to the ER for evaluation of panic attack that occurred while she was at work.  States that she started hyperventilating start feeling tingling in her hands face and then going down her legs.  She feels improved since being brought here but still very anxious.  Denies any SI or HI.  She has tried medications in the past but did not feel like they worked for her did not like the side effects.  She does have plan to follow-up in [facility] tomorrow.  Denies any other pain or discomfort.     Physical Exam   Triage Vital Signs: ED Triage Vitals  Enc Vitals Group     BP 11/03/22 1442 (!) 157/123     Pulse Rate 11/03/22 1442 (!) 135     Resp 11/03/22 1442 (!) 24     Temp 11/03/22 1442 98.1 F (36.7 C)     Temp Source 11/03/22 1442 Oral     SpO2 11/03/22 1442 100 %     Weight 11/03/22 1444 206 lb (93.4 kg)     Height 11/03/22 1444 5\' 9"  (1.753 m)     Head Circumference --      Peak Flow --      Pain Score 11/03/22 1442 0     Pain Loc --      Pain Edu? --      Excl. in GC? --     Most recent vital signs: Vitals:   11/03/22 1736 11/03/22 1738  BP: (!) 144/109   Pulse:  95  Resp:  18  Temp:    SpO2:  99%     Constitutional: Alert  Eyes: Conjunctivae are normal.  Head: Atraumatic. Nose: No congestion/rhinnorhea. Mouth/Throat: Mucous membranes are moist.   Neck: Painless ROM.  Cardiovascular:   Good peripheral circulation. Respiratory: Normal respiratory effort.  No retractions.  Gastrointestinal: Soft and nontender.  Musculoskeletal:  no deformity Neurologic:  MAE spontaneously. No gross focal neurologic deficits are appreciated.  Skin:  Skin is warm, dry and intact. No rash  noted. Psychiatric: Mood and affect are anxious    ED Results / Procedures / Treatments   Labs (all labs ordered are listed, but only abnormal results are displayed) Labs Reviewed  BASIC METABOLIC PANEL  CBC  URINALYSIS, ROUTINE W REFLEX MICROSCOPIC  CBG MONITORING, ED  POC URINE PREG, ED     EKG  ED ECG REPORT I, Willy Eddy, the attending physician, personally viewed and interpreted this ECG.   Date: 11/03/2022  EKG Time: 14:56  Rate: 150  Rhythm: sinus  Axis: normal  Intervals: borderline prolonged qt  ST&T Change: no stemi, no depression    RADIOLOGY Please see ED Course for my review and interpretation.  I personally reviewed all radiographic images ordered to evaluate for the above acute complaints and reviewed radiology reports and findings.  These findings were personally discussed with the patient.  Please see medical record for radiology report.    PROCEDURES:  Critical Care performed: No  Procedures   MEDICATIONS ORDERED IN ED: Medications  diazepam (VALIUM) tablet 2 mg (2 mg Oral Given 11/03/22 1633)     IMPRESSION / MDM / ASSESSMENT AND PLAN /  ED COURSE  I reviewed the triage vital signs and the nursing notes.                              Differential diagnosis includes, but is not limited to, dysrhythmia, electrolyte abnormality, panic attack, SVT  Patient presenting to the ER for evaluation of symptoms as described above.  Based on symptoms, risk factors and considered above differential, this presenting complaint could reflect a potentially life-threatening illness therefore the patient will be placed on continuous pulse oximetry and telemetry for monitoring.  Laboratory evaluation will be sent to evaluate for the above complaints.  Very anxious appearing admitted symptoms likely secondary to panic attack will give Valium.  Does not meet criteria for IVC.  Will observe.   Clinical Course as of 11/03/22 1750  Sun Nov 03, 2022  1745  Patient reassessed.  The patient's blood work was lost in the lab.  Went to recollect labs but patient feeling significantly improved and does not want to be stuck again.  Her repeat vital signs are significantly improved.  This is most consistent with panic attack lower suspicion for electrolyte abnormality.  Does appear appropriate for outpatient follow-up. [PR]    Clinical Course User Index [PR] Willy Eddy, MD     FINAL CLINICAL IMPRESSION(S) / ED DIAGNOSES   Final diagnoses:  Panic attack     Rx / DC Orders   ED Discharge Orders          Ordered    diazepam (VALIUM) 2 MG tablet  Every 8 hours PRN        11/03/22 1748             Note:  This document was prepared using Dragon voice recognition software and may include unintentional dictation errors.    Willy Eddy, MD 11/03/22 1750

## 2022-11-03 NOTE — ED Notes (Signed)
Blue top sent. Ventricular rate 148 ST on EKG shown to [place].
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
## 2022-12-22 ENCOUNTER — Other Ambulatory Visit: Payer: Self-pay

## 2022-12-22 ENCOUNTER — Emergency Department (HOSPITAL_BASED_OUTPATIENT_CLINIC_OR_DEPARTMENT_OTHER)
Admission: EM | Admit: 2022-12-22 | Discharge: 2022-12-23 | Disposition: A | Discharge status: Home / Self Care | Payer: Self-pay | Attending: Emergency Medicine | Admitting: Emergency Medicine

## 2022-12-22 ENCOUNTER — Encounter (HOSPITAL_BASED_OUTPATIENT_CLINIC_OR_DEPARTMENT_OTHER): Payer: Self-pay | Admitting: Emergency Medicine

## 2022-12-22 DIAGNOSIS — N760 Acute vaginitis: Secondary | ICD-10-CM | POA: Insufficient documentation

## 2022-12-22 DIAGNOSIS — L0231 Cutaneous abscess of buttock: Secondary | ICD-10-CM | POA: Insufficient documentation

## 2022-12-22 MED ORDER — DOXYCYCLINE HYCLATE 100 MG PO TABS
100.0000 mg | ORAL_TABLET | Freq: Once | ORAL | Status: AC
  Administered 2022-12-22: 100 mg via ORAL
  Filled 2022-12-22: qty 1

## 2022-12-22 MED ORDER — LIDOCAINE-EPINEPHRINE (PF) 2 %-1:200000 IJ SOLN
20.0000 mL | Freq: Once | INTRAMUSCULAR | Status: AC
  Administered 2022-12-22: 20 mL
  Filled 2022-12-22: qty 20

## 2022-12-22 MED ORDER — METRONIDAZOLE 500 MG PO TABS: 500.0000 mg | ORAL_TABLET | Freq: Two times a day (BID) | ORAL | 0 refills | Status: AC

## 2022-12-22 MED ORDER — DOXYCYCLINE HYCLATE 100 MG PO CAPS: 100.0000 mg | ORAL_CAPSULE | Freq: Two times a day (BID) | ORAL | 0 refills | Status: AC

## 2022-12-22 MED ORDER — HYDROCODONE-ACETAMINOPHEN 5-325 MG PO TABS
2.0000 | ORAL_TABLET | Freq: Once | ORAL | Status: AC
  Administered 2022-12-22: 2 via ORAL
  Filled 2022-12-22: qty 2

## 2022-12-22 NOTE — ED Triage Notes (Signed)
Possible spider bite to right sided buttock. Noticed 4 days ago. Redness and dark center of wound.  Increased pain.

## 2022-12-22 NOTE — ED Provider Notes (Signed)
[place] EMERGENCY DEPARTMENT AT [facility] Provider Note   CSN: [id number] Arrival date & time: 12/22/22  2153     History {Add pertinent medical, surgical, social history, OB history to HPI:1} Chief Complaint  Patient presents with   Insect Bite    Shelley Graham is a 27 y.o. female.  The history is provided by the patient.   Patient presents with a possible spider bite to her buttocks.  Patient reports several days ago she noticed pain redness and swelling to her right buttocks.  She assumed it was a spider bite.  She reports it is starting to worsen.  No fevers or vomiting.  No painful defecation.  No abdominal pain.  She has never had this before.  She is not diabetic  Patient is also requesting Flagyl for presumed BV.  She is currently on her menstrual cycle    Home Medications Prior to Admission medications   Medication Sig Start Date End Date Taking? Authorizing Provider  doxycycline (VIBRAMYCIN) 100 MG capsule Take 1 capsule (100 mg total) by mouth 2 (two) times daily. One po bid x 7 days 12/22/22  Yes Zadie Rhine, MD  metroNIDAZOLE (FLAGYL) 500 MG tablet Take 1 tablet (500 mg total) by mouth 2 (two) times daily. 12/22/22  Yes Zadie Rhine, MD  diazepam (VALIUM) 2 MG tablet Take 1 tablet (2 mg total) by mouth every 8 (eight) hours as needed for anxiety. 11/03/22 11/03/23  Willy Eddy, MD  gabapentin (NEURONTIN) 300 MG capsule Take 2 capsules (600 mg total) by mouth 3 (three) times daily. 07/22/22   Clapacs, Jackquline Denmark, MD  nicotine (NICODERM CQ - DOSED IN MG/24 HOURS) 21 mg/24hr patch Place 1 patch (21 mg total) onto the skin daily. 07/23/22   Clapacs, Jackquline Denmark, MD      Allergies    Ceftriaxone sodium in dextrose and Ciprofloxacin    Review of Systems   Review of Systems  Constitutional:  Negative for fever.  Gastrointestinal:  Negative for vomiting.  Skin:  Positive for wound.    Physical Exam Updated Vital Signs BP (!) 144/91 (BP Location: Right  Arm)   Pulse (!) 122   Temp 98.8 F (37.1 C) (Oral)   Resp 20   LMP 12/20/2022   SpO2 97%  Physical Exam CONSTITUTIONAL: Well developed/well nourished HEAD: Normocephalic/atraumatic NEURO: Pt is awake/alert/appropriate, moves all extremitiesx4.  No facial droop.   EXTREMITIES: full ROM SKIN: warm, color normal Abscesses noted to the right buttock.  No crepitus, no discharge.  No perirectal abscesses noted.  Female chaperone present for exam PSYCH: no abnormalities of mood noted, alert and oriented to situation  ED Results / Procedures / Treatments   Labs (all labs ordered are listed, but only abnormal results are displayed) Labs Reviewed - No data to display  EKG None  Radiology No results found.  Procedures .Marland KitchenIncision and Drainage  Date/Time: 12/22/2022 11:27 PM  Performed by: Zadie Rhine, MD Authorized by: Zadie Rhine, MD   Consent:    Consent obtained:  Verbal   Consent given by:  Patient   Alternatives discussed:  No treatment Universal protocol:    Patient identity confirmed:  Provided demographic data Location:    Type:  Abscess   Location:  Lower extremity   Lower extremity location:  Buttock   Buttock location:  R buttock Pre-procedure details:    Skin preparation:  Povidone-iodine Sedation:    Sedation type:  None   {Document cardiac monitor, telemetry assessment procedure when appropriate:1}  Medications Ordered in ED Medications  doxycycline (VIBRA-TABS) tablet 100 mg (100 mg Oral Given 12/22/22 2322)  HYDROcodone-acetaminophen (NORCO/VICODIN) 5-325 MG per tablet 2 tablet (2 tablets Oral Given 12/22/22 2322)  lidocaine-EPINEPHrine (XYLOCAINE W/EPI) 2 %-1:200000 (PF) injection 20 mL (20 mLs Infiltration Given 12/22/22 2322)    ED Course/ Medical Decision Making/ A&P   {   Click here for ABCD2, HEART and other calculatorsREFRESH Note before signing :1}                              Medical Decision Making Risk Prescription drug  management.   ***  {Document critical care time when appropriate:1} {Document review of labs and clinical decision tools ie heart score, Chads2Vasc2 etc:1}  {Document your independent review of radiology images, and any outside records:1} {Document your discussion with family members, caretakers, and with consultants:1} {Document social determinants of health affecting pt's care:1} {Document your decision making why or why not admission, treatments were needed:1} Final Clinical Impression(s) / ED Diagnoses Final diagnoses:  Abscess of buttock  Acute vaginitis    Rx / DC Orders ED Discharge Orders          Ordered    doxycycline (VIBRAMYCIN) 100 MG capsule  2 times daily        12/22/22 2311    metroNIDAZOLE (FLAGYL) 500 MG tablet  2 times daily        12/22/22 2311

## 2022-12-23 NOTE — ED Notes (Signed)
Repeat vitals

## 2023-02-14 ENCOUNTER — Encounter (HOSPITAL_BASED_OUTPATIENT_CLINIC_OR_DEPARTMENT_OTHER): Payer: Self-pay | Admitting: Emergency Medicine

## 2023-02-14 ENCOUNTER — Emergency Department (HOSPITAL_BASED_OUTPATIENT_CLINIC_OR_DEPARTMENT_OTHER)
Admission: EM | Admit: 2023-02-14 | Discharge: 2023-02-15 | Discharge status: Against Medical Advice | Payer: Self-pay | Attending: Emergency Medicine | Admitting: Emergency Medicine

## 2023-02-14 ENCOUNTER — Other Ambulatory Visit: Payer: Self-pay

## 2023-02-14 DIAGNOSIS — Z5321 Procedure and treatment not carried out due to patient leaving prior to being seen by health care provider: Secondary | ICD-10-CM | POA: Insufficient documentation

## 2023-02-14 DIAGNOSIS — F419 Anxiety disorder, unspecified: Secondary | ICD-10-CM | POA: Insufficient documentation

## 2023-02-14 NOTE — ED Triage Notes (Signed)
Pt reports worsening anxiety and feeling like she is on the verge of a panic attack Has no insurance and has not been able to afford treatment or meds.

## 2023-04-13 ENCOUNTER — Other Ambulatory Visit: Payer: Self-pay

## 2023-04-13 ENCOUNTER — Emergency Department (HOSPITAL_BASED_OUTPATIENT_CLINIC_OR_DEPARTMENT_OTHER)
Admission: EM | Admit: 2023-04-13 | Discharge: 2023-04-13 | Discharge status: Against Medical Advice | Payer: Self-pay | Attending: Emergency Medicine | Admitting: Emergency Medicine

## 2023-04-13 DIAGNOSIS — Z5321 Procedure and treatment not carried out due to patient leaving prior to being seen by health care provider: Secondary | ICD-10-CM | POA: Insufficient documentation

## 2023-04-13 DIAGNOSIS — Y92524 Gas station as the place of occurrence of the external cause: Secondary | ICD-10-CM | POA: Insufficient documentation

## 2023-04-13 DIAGNOSIS — R200 Anesthesia of skin: Secondary | ICD-10-CM | POA: Insufficient documentation

## 2023-04-13 DIAGNOSIS — F419 Anxiety disorder, unspecified: Secondary | ICD-10-CM | POA: Insufficient documentation

## 2023-04-13 LAB — PREGNANCY, URINE: Preg Test, Ur: NEGATIVE

## 2023-04-13 LAB — URINALYSIS, ROUTINE W REFLEX MICROSCOPIC
Bilirubin Urine: NEGATIVE
Glucose, UA: NEGATIVE mg/dL
Hgb urine dipstick: NEGATIVE
Ketones, ur: NEGATIVE mg/dL
Leukocytes,Ua: NEGATIVE
Nitrite: NEGATIVE
Protein, ur: NEGATIVE mg/dL
Specific Gravity, Urine: <1.005 — ABNORMAL LOW (ref 1.005–1.030)
pH: 5.5 (ref 5.0–8.0)

## 2023-04-13 MED ORDER — LORAZEPAM 1 MG PO TABS
1.0000 mg | ORAL_TABLET | Freq: Once | ORAL | Status: AC
  Administered 2023-04-13: 1 mg via ORAL
  Filled 2023-04-13: qty 1
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
## 2023-04-13 NOTE — ED Notes (Signed)
Pt did not answer when called times 3 by [facility]

## 2023-04-29 ENCOUNTER — Other Ambulatory Visit: Payer: Self-pay

## 2023-04-29 ENCOUNTER — Encounter (HOSPITAL_BASED_OUTPATIENT_CLINIC_OR_DEPARTMENT_OTHER): Payer: Self-pay

## 2023-04-29 ENCOUNTER — Emergency Department (HOSPITAL_BASED_OUTPATIENT_CLINIC_OR_DEPARTMENT_OTHER)
Admission: EM | Admit: 2023-04-29 | Discharge: 2023-04-29 | Discharge status: Against Medical Advice | Payer: Self-pay | Attending: Emergency Medicine | Admitting: Emergency Medicine

## 2023-04-29 DIAGNOSIS — M542 Cervicalgia: Secondary | ICD-10-CM | POA: Insufficient documentation

## 2023-04-29 DIAGNOSIS — Z5321 Procedure and treatment not carried out due to patient leaving prior to being seen by health care provider: Secondary | ICD-10-CM | POA: Insufficient documentation

## 2023-04-29 HISTORY — DX: Unspecified thoracic, thoracolumbar and lumbosacral intervertebral disc disorder: M51.9

## 2023-04-29 HISTORY — DX: Herpesviral infection of urogenital system, unspecified: A60.00

## 2023-04-29 NOTE — ED Notes (Signed)
Pt called for vitals no answer. °

## 2023-04-29 NOTE — ED Triage Notes (Signed)
In for eval of swelling to right posterior neck onset Sunday. Denies injury. PMH pinched nerve and takes Gabapentin. Saw Urgent Care and instructed to take Ibuprofen.

## 2023-04-29 NOTE — ED Notes (Signed)
Pt called x2 no answer 

## 2023-08-24 ENCOUNTER — Emergency Department (HOSPITAL_BASED_OUTPATIENT_CLINIC_OR_DEPARTMENT_OTHER)
Admission: EM | Admit: 2023-08-24 | Discharge: 2023-08-24 | Disposition: A | Discharge status: Home / Self Care | Payer: Self-pay | Attending: Emergency Medicine | Admitting: Emergency Medicine

## 2023-08-24 ENCOUNTER — Other Ambulatory Visit: Payer: Self-pay

## 2023-08-24 ENCOUNTER — Encounter (HOSPITAL_BASED_OUTPATIENT_CLINIC_OR_DEPARTMENT_OTHER): Payer: Self-pay

## 2023-08-24 DIAGNOSIS — W540XXA Bitten by dog, initial encounter: Secondary | ICD-10-CM | POA: Insufficient documentation

## 2023-08-24 DIAGNOSIS — S50871A Other superficial bite of right forearm, initial encounter: Secondary | ICD-10-CM | POA: Insufficient documentation

## 2023-08-24 MED ORDER — AMOXICILLIN-POT CLAVULANATE 875-125 MG PO TABS: 1.0000 | ORAL_TABLET | Freq: Two times a day (BID) | ORAL | 0 refills | Status: AC

## 2023-08-24 NOTE — ED Provider Notes (Signed)
 [place] EMERGENCY DEPARTMENT AT [facility] Provider Note   CSN: [id number] Arrival date & time: 08/24/23  1317     History  Chief Complaint  Patient presents with   Animal Bite    Shelley Graham is a 28 y.o. female complaint of dog bite.  She was bitten by her dog on the forearm yesterday.  Dog is currently in quarantine under observation.  She is up-to-date on her tetanus vaccine.  She is concerned because it is very painful and thought there might be some infection..  She denies numbness or tingling   Animal Bite      Home Medications Prior to Admission medications   Medication Sig Start Date End Date Taking? Authorizing Provider  diazepam  (VALIUM ) 2 MG tablet Take 1 tablet (2 mg total) by mouth every 8 (eight) hours as needed for anxiety. 11/03/22 11/03/23  Suellyn Emory, MD  doxycycline  (VIBRAMYCIN ) 100 MG capsule Take 1 capsule (100 mg total) by mouth 2 (two) times daily. One po bid x 7 days 12/22/22   Eldon Greenland, MD  gabapentin  (NEURONTIN ) 300 MG capsule Take 2 capsules (600 mg total) by mouth 3 (three) times daily. 07/22/22   Clapacs, Elida Grounds, MD  metroNIDAZOLE  (FLAGYL ) 500 MG tablet Take 1 tablet (500 mg total) by mouth 2 (two) times daily. 12/22/22   Eldon Greenland, MD  nicotine  (NICODERM CQ  - DOSED IN MG/24 HOURS) 21 mg/24hr patch Place 1 patch (21 mg total) onto the skin daily. 07/23/22   Clapacs, Elida Grounds, MD      Allergies    Ceftriaxone  sodium in dextrose  and Ciprofloxacin     Review of Systems   Review of Systems  Physical Exam Updated Vital Signs BP (!) 134/92 (BP Location: Right Arm)   Pulse (!) 102   Temp 98 F (36.7 C)   Resp 16   Ht 5\' 9"  (1.753 m)   Wt 84.8 kg   LMP 08/17/2023   SpO2 98%   BMI 27.62 kg/m  Physical Exam Vitals and nursing note reviewed.  Constitutional:      General: She is not in acute distress.    Appearance: She is well-developed. She is not diaphoretic.  HENT:     Head: Normocephalic and atraumatic.      Right Ear: External ear normal.     Left Ear: External ear normal.     Nose: Nose normal.     Mouth/Throat:     Mouth: Mucous membranes are moist.  Eyes:     General: No scleral icterus.    Conjunctiva/sclera: Conjunctivae normal.  Cardiovascular:     Rate and Rhythm: Normal rate and regular rhythm.     Heart sounds: Normal heart sounds. No murmur heard.    No friction rub. No gallop.  Pulmonary:     Effort: Pulmonary effort is normal. No respiratory distress.     Breath sounds: Normal breath sounds.  Abdominal:     General: Bowel sounds are normal. There is no distension.     Palpations: Abdomen is soft. There is no mass.     Tenderness: There is no abdominal tenderness. There is no guarding.  Musculoskeletal:     Cervical back: Normal range of motion.     Comments: Several very superficial bites of the right upper forearm.  There is no active bleeding or obvious broken skin.  No bruising, no significant swelling.  Tender to palpation  Skin:    General: Skin is warm and dry.  Neurological:  Mental Status: She is alert and oriented to person, place, and time.  Psychiatric:        Behavior: Behavior normal.     ED Results / Procedures / Treatments   Labs (all labs ordered are listed, but only abnormal results are displayed) Labs Reviewed - No data to display  EKG None  Radiology No results found.  Procedures Procedures    Medications Ordered in ED Medications - No data to display  ED Course/ Medical Decision Making/ A&P                                 Medical Decision Making  Patient with dog bite to the forearm.  Will discharge with amoxicillin .  As there is no obvious broken skin she may use a lidocaine  patch ice rest compression and elevation.  Her dog is under quarantine.  Discussed outpatient follow-up and return precautions.        Final Clinical Impression(s) / ED Diagnoses Final diagnoses:  None    Rx / DC Orders ED Discharge Orders      None         Tama Fails, PA-C 08/24/23 1456    Deatra Face, MD 08/24/23 [phone]

## 2023-08-24 NOTE — ED Triage Notes (Signed)
 Patient presents to the ED with c/o dog bite to R forearm that happened yesterday. Two puncture wounds noted. No bleeding, redness noted. Dog is known to patient. Has been taken [employer] (life [employer]) and is in quarantine now. Dog was not up to date on rabies vaccinations.

## 2023-08-24 NOTE — Discharge Instructions (Signed)
Contact a health care provider if: You have more redness, swelling, or pain around your wound. Your wound feels warm to the touch. You have a fever or chills. You have a general feeling of sickness (malaise). You feel nauseous or you vomit. You have pain that does not get better. Get help right away if: You have a red streak that leads away from your wound. You have non-clear fluid or more blood coming from your wound. There is pus or a bad smell coming from your wound. You have trouble moving your injured area. You have numbness or tingling that spreads beyond your wound. 

## 2024-02-12 ENCOUNTER — Other Ambulatory Visit: Payer: Self-pay

## 2024-02-12 ENCOUNTER — Emergency Department
Admission: EM | Admit: 2024-02-12 | Discharge: 2024-02-12 | Disposition: A | Discharge status: Home / Self Care | Payer: Self-pay | Attending: Emergency Medicine | Admitting: Emergency Medicine

## 2024-02-12 DIAGNOSIS — R197 Diarrhea, unspecified: Secondary | ICD-10-CM | POA: Insufficient documentation

## 2024-02-12 DIAGNOSIS — F17200 Nicotine dependence, unspecified, uncomplicated: Secondary | ICD-10-CM | POA: Insufficient documentation

## 2024-02-12 DIAGNOSIS — I10 Essential (primary) hypertension: Secondary | ICD-10-CM | POA: Insufficient documentation

## 2024-02-12 DIAGNOSIS — E860 Dehydration: Secondary | ICD-10-CM | POA: Insufficient documentation

## 2024-02-12 DIAGNOSIS — R17 Unspecified jaundice: Secondary | ICD-10-CM

## 2024-02-12 DIAGNOSIS — R112 Nausea with vomiting, unspecified: Secondary | ICD-10-CM

## 2024-02-12 LAB — URINALYSIS, ROUTINE W REFLEX MICROSCOPIC
Glucose, UA: NEGATIVE mg/dL
Hgb urine dipstick: NEGATIVE
Ketones, ur: 20 mg/dL — AB
Nitrite: NEGATIVE
Protein, ur: 100 mg/dL — AB
Specific Gravity, Urine: 1.044 — ABNORMAL HIGH (ref 1.005–1.030)
pH: 5 (ref 5.0–8.0)

## 2024-02-12 LAB — RESP PANEL BY RT-PCR (RSV, FLU A&B, COVID)  RVPGX2
Influenza A by PCR: NEGATIVE
Influenza B by PCR: NEGATIVE
Resp Syncytial Virus by PCR: NEGATIVE
SARS Coronavirus 2 by RT PCR: NEGATIVE

## 2024-02-12 LAB — BILIRUBIN, FRACTIONATED(TOT/DIR/INDIR)
Bilirubin, Direct: 0.3 mg/dL — ABNORMAL HIGH (ref 0.0–0.2)
Indirect Bilirubin: 2.6 mg/dL — ABNORMAL HIGH (ref 0.3–0.9)
Total Bilirubin: 2.9 mg/dL — ABNORMAL HIGH (ref 0.0–1.2)

## 2024-02-12 LAB — CBC
HCT: 41.7 % (ref 36.0–46.0)
Hemoglobin: 14.6 g/dL (ref 12.0–15.0)
MCH: 30.3 pg (ref 26.0–34.0)
MCHC: 35 g/dL (ref 30.0–36.0)
MCV: 86.5 fL (ref 80.0–100.0)
Platelets: 339 K/uL (ref 150–400)
RBC: 4.82 MIL/uL (ref 3.87–5.11)
RDW: 12.2 % (ref 11.5–15.5)
WBC: 12.3 K/uL — ABNORMAL HIGH (ref 4.0–10.5)
nRBC: 0 % (ref 0.0–0.2)

## 2024-02-12 LAB — COMPREHENSIVE METABOLIC PANEL WITH GFR
ALT: 37 U/L (ref 0–44)
AST: 20 U/L (ref 15–41)
Albumin: 4.5 g/dL (ref 3.5–5.0)
Alkaline Phosphatase: 91 U/L (ref 38–126)
Anion gap: 15 (ref 5–15)
BUN: 22 mg/dL — ABNORMAL HIGH (ref 6–20)
CO2: 20 mmol/L — ABNORMAL LOW (ref 22–32)
Calcium: 9.7 mg/dL (ref 8.9–10.3)
Chloride: 105 mmol/L (ref 98–111)
Creatinine, Ser: 0.66 mg/dL (ref 0.44–1.00)
GFR, Estimated: >60 mL/min (ref >60)
Glucose, Bld: 119 mg/dL — ABNORMAL HIGH (ref 70–99)
Potassium: 3.3 mmol/L — ABNORMAL LOW (ref 3.5–5.1)
Sodium: 140 mmol/L (ref 135–145)
Total Bilirubin: 3 mg/dL — ABNORMAL HIGH (ref 0.0–1.2)
Total Protein: 8.2 g/dL — ABNORMAL HIGH (ref 6.5–8.1)

## 2024-02-12 LAB — LIPASE, BLOOD: Lipase: 26 U/L (ref 11–51)

## 2024-02-12 LAB — PREGNANCY, URINE: Preg Test, Ur: NEGATIVE

## 2024-02-12 MED ORDER — SODIUM CHLORIDE 0.9 % IV BOLUS
1000.0000 mL | Freq: Once | INTRAVENOUS | Status: AC
  Administered 2024-02-12: 1000 mL via INTRAVENOUS

## 2024-02-12 MED ORDER — ONDANSETRON 4 MG PO TBDP: 4.0000 mg | ORAL_TABLET | Freq: Three times a day (TID) | ORAL | 0 refills | Status: AC | PRN

## 2024-02-12 NOTE — ED Notes (Signed)
 See triage note  Presents with a 2 day hx of n/v   States she developed some abd discomfort this am  Low grade temp

## 2024-02-12 NOTE — Discharge Instructions (Addendum)
 Follow-up with your primary care provider in 1 week for repeat labs of your hepatic function panel and indirect and direct bilirubin.  Your respiratory panel which includes COVID, influenza and RSV were negative.   A virus may last up to 2-3 weeks.   Take tylenol  or ibuprofen  for pain or fever as directed.   Your lab work and urinalysis revealed signs of dehydration.  Stay hydrated by drinking plenty of fluids to thin mucus. Get adequate amount of sleep and avoid overexertion. Consider a humidifier at night. Warm teas and a spoonful of honey have shown some benefit.

## 2024-02-12 NOTE — ED Provider Notes (Signed)
 [facility] Emergency Department Provider Note     Event Date/Time   First MD Initiated Contact with Patient 02/12/24 1224     (approximate)   History   Emesis   HPI  Shelley Graham is a 28 y.o. female with a past medical history of HTN, cannabis, cocaine, tobacco and alcohol use disorder presents to the ED for evaluation of nausea, vomiting and diarrhea x less than 24 hours.  Associated symptoms includes subjective fever, hot and cold chills.  Patient reports she has had COVID in the past and symptoms are similar.  Has a 43-year-old daughter at home who is in daycare and is wanting to be evaluated so she does not transmit to daughter. Endorses abdominal pain with onset of symptoms that has resolved during my evaluation.  Patient also denies nausea at this time.     Physical Exam   Triage Vital Signs: ED Triage Vitals  Encounter Vitals Group     BP 02/12/24 1156 (!) 167/113     Girls Systolic BP Percentile --      Girls Diastolic BP Percentile --      Boys Systolic BP Percentile --      Boys Diastolic BP Percentile --      Pulse Rate 02/12/24 1156 76     Resp 02/12/24 1156 18     Temp 02/12/24 1156 99 F (37.2 C)     Temp Source 02/12/24 1156 Oral     SpO2 02/12/24 1156 99 %     Weight 02/12/24 1158 230 lb (104.3 kg)     Height 02/12/24 1158 5' 10 (1.778 m)     Head Circumference --      Peak Flow --      Pain Score 02/12/24 1155 6     Pain Loc --      Pain Education --      Exclude from Growth Chart --     Most recent vital signs: Vitals:   02/12/24 1413 02/12/24 1444  BP: (!) 137/101 130/88  Pulse: 63 60  Resp: 17 16  Temp:    SpO2: 97% 97%   General: Nontoxic appearing and comfortable. Alert and oriented. INAD.  Skin:  Warm, dry and intact. No jaundice.      Head:  NCAT.  Eyes:  PERRLA. EOMI.  CV:  Good peripheral perfusion. RRR.  RESP:  Normal effort. LCTAB.  ABD:  No distention. Soft, Non tender. No masses or organomegaly.    ED Results / Procedures / Treatments   Labs (all labs ordered are listed, but only abnormal results are displayed) Labs Reviewed  COMPREHENSIVE METABOLIC PANEL WITH GFR - Abnormal; Notable for the following components:      Result Value   Potassium 3.3 (*)    CO2 20 (*)    Glucose, Bld 119 (*)    BUN 22 (*)    Total Protein 8.2 (*)    Total Bilirubin 3.0 (*)    All other components within normal limits  CBC - Abnormal; Notable for the following components:   WBC 12.3 (*)    All other components within normal limits  URINALYSIS, ROUTINE W REFLEX MICROSCOPIC - Abnormal; Notable for the following components:   Color, Urine AMBER (*)    APPearance HAZY (*)    Specific Gravity, Urine 1.044 (*)    Bilirubin Urine MODERATE (*)    Ketones, ur 20 (*)    Protein, ur 100 (*)    Leukocytes,Ua  TRACE (*)    Bacteria, UA RARE (*)    All other components within normal limits  BILIRUBIN, FRACTIONATED(TOT/DIR/INDIR) - Abnormal; Notable for the following components:   Total Bilirubin 2.9 (*)    Bilirubin, Direct 0.3 (*)    Indirect Bilirubin 2.6 (*)    All other components within normal limits  RESP PANEL BY RT-PCR (RSV, FLU A&B, COVID)  RVPGX2  LIPASE, BLOOD  PREGNANCY, URINE   No results found.  PROCEDURES:  Critical Care performed: No  Procedures   MEDICATIONS ORDERED IN ED: Medications  sodium chloride  0.9 % bolus 1,000 mL (0 mLs Intravenous Stopped 02/12/24 1443)     IMPRESSION / MDM / ASSESSMENT AND PLAN / ED COURSE  I reviewed the triage vital signs and the nursing notes.                               28 y.o. female presents to the emergency department for evaluation and treatment of acute n/v/d. See HPI for further details.   Differential diagnosis includes, but is not limited to viral gastroenteritis, viral URI  Patient's presentation is most consistent with acute complicated illness / injury requiring diagnostic workup.  Patient is alert and oriented.  She is  hemodynamic stable and afebrile.  Physical exam findings are as stated above.  Lab work pertinent for WBC 12.3, potassium 3.3, CO2 20, glucose 119, BUN 22 and total bilirubin 3.0.  Reassuring respiratory panel.  Further workup with indirect and direct bilirubin showing direct 0.3 and indirect 2.6.  Patient elevated total bilirubin is fairly consistent to her trend.  Patient is asymptomatic.  I do believe she is in stable condition for discharge home.  Advised to follow-up with primary care provider for repeat labs.  She verbalized understanding.  ED return precaution discussed.  FINAL CLINICAL IMPRESSION(S) / ED DIAGNOSES   Final diagnoses:  Nausea vomiting and diarrhea  Dehydration  Elevated bilirubin   Rx / DC Orders   ED Discharge Orders          Ordered    ondansetron  (ZOFRAN -ODT) 4 MG disintegrating tablet  Every 8 hours PRN        02/12/24 1426             Note:  This document was prepared using Dragon voice recognition software and may include unintentional dictation errors.    Margrette, Catrinia Racicot A, PA-C 02/12/24 1805    Waymond Lorelle Cummins, MD 02/12/24 [phone]

## 2024-02-12 NOTE — ED Triage Notes (Signed)
 Patient states cold sweats, N/V/D and abdominal pain since this morning.
